# Patient Record
Sex: Male | Born: 1935 | Race: Black or African American | Hispanic: No | Smoking: Never smoker
Health system: Southern US, Community
[De-identification: ages and names within clinical notes are randomized; demographics above are authoritative.]

## PROBLEM LIST (undated history)

## (undated) DIAGNOSIS — E119 Type 2 diabetes mellitus without complications: Secondary | ICD-10-CM

## (undated) DIAGNOSIS — R569 Unspecified convulsions: Secondary | ICD-10-CM

## (undated) DIAGNOSIS — I10 Essential (primary) hypertension: Secondary | ICD-10-CM

## (undated) DIAGNOSIS — N200 Calculus of kidney: Secondary | ICD-10-CM

## (undated) DIAGNOSIS — E079 Disorder of thyroid, unspecified: Secondary | ICD-10-CM

## (undated) DIAGNOSIS — M109 Gout, unspecified: Secondary | ICD-10-CM

## (undated) HISTORY — PX: HERNIA REPAIR: SHX51

## (undated) HISTORY — DX: Calculus of kidney: N20.0

## (undated) HISTORY — PX: EYE SURGERY: SHX253

---

## 2006-05-19 ENCOUNTER — Other Ambulatory Visit: Payer: Self-pay

## 2006-05-19 ENCOUNTER — Emergency Department: Payer: Self-pay

## 2006-05-20 ENCOUNTER — Ambulatory Visit: Payer: Self-pay

## 2006-05-21 ENCOUNTER — Ambulatory Visit: Payer: Self-pay

## 2006-09-16 ENCOUNTER — Ambulatory Visit: Payer: Self-pay | Admitting: Infectious Diseases

## 2006-09-20 ENCOUNTER — Other Ambulatory Visit: Payer: Self-pay

## 2006-09-20 ENCOUNTER — Inpatient Hospital Stay: Payer: Self-pay | Admitting: Infectious Diseases

## 2007-09-20 ENCOUNTER — Inpatient Hospital Stay: Payer: Self-pay | Admitting: Internal Medicine

## 2008-08-07 ENCOUNTER — Emergency Department: Payer: Self-pay | Admitting: Emergency Medicine

## 2008-10-19 ENCOUNTER — Inpatient Hospital Stay: Payer: Self-pay | Admitting: Internal Medicine

## 2009-01-01 ENCOUNTER — Emergency Department: Payer: Self-pay | Admitting: Emergency Medicine

## 2010-07-23 ENCOUNTER — Ambulatory Visit: Payer: Self-pay | Admitting: Internal Medicine

## 2011-07-02 ENCOUNTER — Other Ambulatory Visit: Payer: Self-pay | Admitting: Internal Medicine

## 2011-07-03 ENCOUNTER — Other Ambulatory Visit: Payer: Self-pay | Admitting: Internal Medicine

## 2011-07-03 MED ORDER — MAGNESIUM OXIDE 400 MG PO TABS: 400.0000 mg | ORAL_TABLET | Freq: Two times a day (BID) | ORAL | Status: DC

## 2011-08-19 ENCOUNTER — Other Ambulatory Visit: Payer: Self-pay | Admitting: Internal Medicine

## 2011-08-20 MED ORDER — AMLODIPINE BESYLATE 10 MG PO TABS: 10.0000 mg | ORAL_TABLET | Freq: Every day | ORAL | Status: AC

## 2011-09-03 ENCOUNTER — Other Ambulatory Visit: Payer: Self-pay | Admitting: Internal Medicine

## 2011-10-04 ENCOUNTER — Ambulatory Visit: Payer: Self-pay | Admitting: Ophthalmology

## 2011-10-14 ENCOUNTER — Ambulatory Visit: Payer: Self-pay | Admitting: Ophthalmology

## 2011-11-28 ENCOUNTER — Other Ambulatory Visit: Payer: Self-pay | Admitting: *Deleted

## 2011-11-28 MED ORDER — MAGNESIUM OXIDE 400 MG PO TABS: 400.0000 mg | ORAL_TABLET | Freq: Two times a day (BID) | ORAL | Status: DC

## 2011-11-28 NOTE — Telephone Encounter (Signed)
Faxed request from [facility] [employer].  Pt hasn't been seen in this office and doesn't have any pending appts.

## 2011-11-28 NOTE — Telephone Encounter (Signed)
Ok to call in a 3o day supply #60 but no more  refills without visit.  We have been open over 6 months now

## 2011-12-26 ENCOUNTER — Other Ambulatory Visit: Payer: Self-pay | Admitting: Internal Medicine

## 2011-12-26 MED ORDER — METOPROLOL SUCCINATE ER 50 MG PO TB24: 50.0000 mg | ORAL_TABLET | Freq: Every day | ORAL | Status: DC

## 2012-01-01 ENCOUNTER — Other Ambulatory Visit: Payer: Self-pay | Admitting: Internal Medicine

## 2012-01-01 ENCOUNTER — Ambulatory Visit: Payer: Self-pay | Admitting: Internal Medicine

## 2012-01-01 NOTE — Telephone Encounter (Signed)
[phone] Pt went to wrong office this morning.  He is out of his levetiracet 500mg  2 x daily [facility] He stated he has been out of med for a while

## 2012-01-02 MED ORDER — LEVETIRACETAM 500 MG PO TABS: 500.0000 mg | ORAL_TABLET | Freq: Two times a day (BID) | ORAL | Status: DC

## 2012-01-02 NOTE — Telephone Encounter (Signed)
Patient missed his appt yesterday, but has rescheduled.  He has been out of this medication and needs a refill prior to the appt he made.  Please advise.

## 2012-01-08 ENCOUNTER — Ambulatory Visit: Payer: Medicare Other | Admitting: Internal Medicine

## 2012-01-08 DIAGNOSIS — Z0289 Encounter for other administrative examinations: Secondary | ICD-10-CM

## 2012-01-29 ENCOUNTER — Other Ambulatory Visit: Payer: Self-pay | Admitting: Internal Medicine

## 2012-01-29 MED ORDER — MAGNESIUM OXIDE 400 MG PO TABS: 400.0000 mg | ORAL_TABLET | Freq: Two times a day (BID) | ORAL | Status: AC

## 2012-01-29 MED ORDER — ALLOPURINOL 100 MG PO TABS: 100.0000 mg | ORAL_TABLET | Freq: Every day | ORAL | Status: DC

## 2012-03-10 ENCOUNTER — Other Ambulatory Visit: Payer: Self-pay | Admitting: Internal Medicine

## 2012-03-11 NOTE — Telephone Encounter (Signed)
Patient has cancelled and no showed for a few appointments, and does not have another one scheduled.  And is now requesting refills.  Please advise.

## 2012-03-11 NOTE — Telephone Encounter (Signed)
Do not refill!!   i just read your message

## 2012-03-12 ENCOUNTER — Other Ambulatory Visit: Payer: Self-pay | Admitting: Internal Medicine

## 2012-03-16 ENCOUNTER — Other Ambulatory Visit: Payer: Self-pay | Admitting: Internal Medicine

## 2012-09-24 ENCOUNTER — Other Ambulatory Visit: Payer: Self-pay | Admitting: Internal Medicine

## 2012-09-24 ENCOUNTER — Other Ambulatory Visit: Payer: Self-pay

## 2012-09-27 ENCOUNTER — Other Ambulatory Visit: Payer: Self-pay | Admitting: Internal Medicine

## 2013-01-10 ENCOUNTER — Other Ambulatory Visit: Payer: Self-pay | Admitting: Internal Medicine

## 2013-01-22 ENCOUNTER — Other Ambulatory Visit: Payer: Self-pay | Admitting: Internal Medicine

## 2013-01-28 ENCOUNTER — Ambulatory Visit: Payer: Self-pay | Admitting: Specialist

## 2013-02-01 ENCOUNTER — Other Ambulatory Visit: Payer: Self-pay | Admitting: Internal Medicine

## 2013-02-03 ENCOUNTER — Other Ambulatory Visit: Payer: Self-pay | Admitting: Internal Medicine

## 2013-02-03 NOTE — Telephone Encounter (Signed)
This is the third refill request we have received (the first two were filled). However, this patient has not been seen in over a year that I can see. Pt cancelled (3/13) & no showed (4/13). No recent or future appt has been scheduled. Please advise

## 2013-02-04 NOTE — Telephone Encounter (Signed)
No refills on anything in chart until he actually shows up for appt.

## 2013-04-27 ENCOUNTER — Ambulatory Visit: Payer: Self-pay | Admitting: Nephrology

## 2013-05-07 ENCOUNTER — Ambulatory Visit
Admit: 2013-05-07 | Disposition: A | Discharge status: Home / Self Care | Payer: Self-pay | Attending: Nurse Practitioner | Admitting: Nurse Practitioner

## 2013-05-09 ENCOUNTER — Inpatient Hospital Stay: Payer: Self-pay | Admitting: Internal Medicine

## 2013-05-09 LAB — URINALYSIS, COMPLETE
Blood: NEGATIVE
Glucose,UR: NEGATIVE mg/dL (ref 0–75)
Leukocyte Esterase: NEGATIVE
Nitrite: NEGATIVE
Ph: 5 (ref 4.5–8.0)
RBC,UR: 1 /HPF (ref 0–5)
Specific Gravity: 1.013 (ref 1.003–1.030)
Squamous Epithelial: 1
WBC UR: 1 /HPF (ref 0–5)

## 2013-05-09 LAB — CBC
HCT: 44.3 % (ref 40.0–52.0)
MCHC: 34.7 g/dL (ref 32.0–36.0)
MCV: 99 fL (ref 80–100)
Platelet: 238 10*3/uL (ref 150–440)
WBC: 5 10*3/uL (ref 3.8–10.6)

## 2013-05-09 LAB — BASIC METABOLIC PANEL
BUN: 29 mg/dL — ABNORMAL HIGH (ref 7–18)
Co2: 19 mmol/L — ABNORMAL LOW (ref 21–32)
Creatinine: 1.46 mg/dL — ABNORMAL HIGH (ref 0.60–1.30)
EGFR (African American): 53 — ABNORMAL LOW
Glucose: 102 mg/dL — ABNORMAL HIGH (ref 65–99)
Potassium: 5.1 mmol/L (ref 3.5–5.1)
Sodium: 134 mmol/L — ABNORMAL LOW (ref 136–145)

## 2013-05-09 LAB — COMPREHENSIVE METABOLIC PANEL
Albumin: 4 g/dL (ref 3.4–5.0)
Alkaline Phosphatase: 80 U/L (ref 50–136)
BUN: 35 mg/dL — ABNORMAL HIGH (ref 7–18)
Bilirubin,Total: 0.4 mg/dL (ref 0.2–1.0)
Calcium, Total: 9.2 mg/dL (ref 8.5–10.1)
Co2: 16 mmol/L — ABNORMAL LOW (ref 21–32)
EGFR (African American): 49 — ABNORMAL LOW
EGFR (Non-African Amer.): 43 — ABNORMAL LOW
Osmolality: 272 (ref 275–301)
Potassium: 5.7 mmol/L — ABNORMAL HIGH (ref 3.5–5.1)
SGOT(AST): 26 U/L (ref 15–37)
SGPT (ALT): 31 U/L (ref 12–78)
Total Protein: 9 g/dL — ABNORMAL HIGH (ref 6.4–8.2)

## 2013-05-09 LAB — LIPASE, BLOOD: Lipase: 133 U/L (ref 73–393)

## 2013-05-09 LAB — ETHANOL: Ethanol %: 0.068 % (ref 0.000–0.080)

## 2013-05-10 LAB — LIPID PANEL
Ldl Cholesterol, Calc: 37 mg/dL (ref 0–100)
VLDL Cholesterol, Calc: 24 mg/dL (ref 5–40)

## 2013-05-10 LAB — BASIC METABOLIC PANEL
Anion Gap: 7 (ref 7–16)
Calcium, Total: 9.3 mg/dL (ref 8.5–10.1)
Creatinine: 1.44 mg/dL — ABNORMAL HIGH (ref 0.60–1.30)
EGFR (Non-African Amer.): 46 — ABNORMAL LOW
Osmolality: 282 (ref 275–301)
Potassium: 5.3 mmol/L — ABNORMAL HIGH (ref 3.5–5.1)

## 2013-05-10 LAB — CBC WITH DIFFERENTIAL/PLATELET
Eosinophil #: 0 10*3/uL (ref 0.0–0.7)
Eosinophil %: 0.8 %
HCT: 40.3 % (ref 40.0–52.0)
Lymphocyte %: 18.1 %
Monocyte #: 0.6 x10 3/mm (ref 0.2–1.0)
Neutrophil #: 3.5 10*3/uL (ref 1.4–6.5)
Neutrophil %: 68.3 %
Platelet: 219 10*3/uL (ref 150–440)
RDW: 14.8 % — ABNORMAL HIGH (ref 11.5–14.5)

## 2013-05-10 LAB — HEMOGLOBIN A1C: Hemoglobin A1C: 8 % — ABNORMAL HIGH (ref 4.2–6.3)

## 2013-05-10 LAB — CLOSTRIDIUM DIFFICILE BY PCR

## 2013-05-11 LAB — BASIC METABOLIC PANEL
Anion Gap: 8 (ref 7–16)
Calcium, Total: 8.6 mg/dL (ref 8.5–10.1)
Creatinine: 1.19 mg/dL (ref 0.60–1.30)
EGFR (African American): >60
EGFR (Non-African Amer.): 58 — ABNORMAL LOW
Osmolality: 283 (ref 275–301)
Potassium: 3.3 mmol/L — ABNORMAL LOW (ref 3.5–5.1)
Sodium: 140 mmol/L (ref 136–145)

## 2013-05-11 LAB — URINE CULTURE

## 2013-05-13 LAB — CBC WITH DIFFERENTIAL/PLATELET
Basophil #: 0 10*3/uL (ref 0.0–0.1)
Eosinophil #: 0.2 10*3/uL (ref 0.0–0.7)
HCT: 38.3 % — ABNORMAL LOW (ref 40.0–52.0)
HGB: 13.2 g/dL (ref 13.0–18.0)
Lymphocyte #: 1.1 10*3/uL (ref 1.0–3.6)
Lymphocyte %: 36.3 %
MCH: 34.1 pg — ABNORMAL HIGH (ref 26.0–34.0)
MCHC: 34.4 g/dL (ref 32.0–36.0)
MCV: 99 fL (ref 80–100)
Monocyte %: 11.4 %
Neutrophil #: 1.3 10*3/uL — ABNORMAL LOW (ref 1.4–6.5)
Platelet: 183 10*3/uL (ref 150–440)
RBC: 3.87 10*6/uL — ABNORMAL LOW (ref 4.40–5.90)

## 2013-05-13 LAB — BASIC METABOLIC PANEL
Anion Gap: 7 (ref 7–16)
Calcium, Total: 8.9 mg/dL (ref 8.5–10.1)
Chloride: 110 mmol/L — ABNORMAL HIGH (ref 98–107)
Co2: 23 mmol/L (ref 21–32)
Creatinine: 1.18 mg/dL (ref 0.60–1.30)
Potassium: 4 mmol/L (ref 3.5–5.1)

## 2013-05-17 ENCOUNTER — Encounter: Payer: Self-pay | Admitting: Nephrology

## 2013-05-19 LAB — PATHOLOGY REPORT

## 2013-05-22 LAB — COMPREHENSIVE METABOLIC PANEL
Anion Gap: 7 (ref 7–16)
Bilirubin,Total: 0.4 mg/dL (ref 0.2–1.0)
Calcium, Total: 9.5 mg/dL (ref 8.5–10.1)
Chloride: 104 mmol/L (ref 98–107)
Co2: 25 mmol/L (ref 21–32)
EGFR (Non-African Amer.): 24 — ABNORMAL LOW
Osmolality: 277 (ref 275–301)
Potassium: 4.2 mmol/L (ref 3.5–5.1)
SGOT(AST): 23 U/L (ref 15–37)
SGPT (ALT): 18 U/L (ref 12–78)
Total Protein: 7.9 g/dL (ref 6.4–8.2)

## 2013-05-22 LAB — CBC
HCT: 38.8 % — ABNORMAL LOW (ref 40.0–52.0)
HGB: 13.3 g/dL (ref 13.0–18.0)
MCH: 33.5 pg (ref 26.0–34.0)
MCV: 98 fL (ref 80–100)
Platelet: 335 10*3/uL (ref 150–440)
RBC: 3.98 10*6/uL — ABNORMAL LOW (ref 4.40–5.90)
WBC: 4.9 10*3/uL (ref 3.8–10.6)

## 2013-05-22 LAB — ETHANOL
Ethanol %: <0.003 % (ref 0.000–0.080)
Ethanol: <3 mg/dL

## 2013-05-23 LAB — BASIC METABOLIC PANEL
Anion Gap: 4 — ABNORMAL LOW (ref 7–16)
BUN: 11 mg/dL (ref 7–18)
Calcium, Total: 8.4 mg/dL — ABNORMAL LOW (ref 8.5–10.1)
Chloride: 110 mmol/L — ABNORMAL HIGH (ref 98–107)
Co2: 24 mmol/L (ref 21–32)
Creatinine: 1.42 mg/dL — ABNORMAL HIGH (ref 0.60–1.30)
EGFR (African American): 54 — ABNORMAL LOW
Glucose: 109 mg/dL — ABNORMAL HIGH (ref 65–99)
Osmolality: 276 (ref 275–301)
Potassium: 5.1 mmol/L (ref 3.5–5.1)

## 2013-05-23 LAB — DRUG SCREEN, URINE
Amphetamines, Ur Screen: NEGATIVE (ref ?–1000)
Barbiturates, Ur Screen: NEGATIVE (ref ?–200)
Cannabinoid 50 Ng, Ur ~~LOC~~: NEGATIVE (ref ?–50)
Cocaine Metabolite,Ur ~~LOC~~: NEGATIVE (ref ?–300)
MDMA (Ecstasy)Ur Screen: NEGATIVE (ref ?–500)
Tricyclic, Ur Screen: NEGATIVE (ref ?–1000)

## 2013-05-23 LAB — URINALYSIS, COMPLETE
Bilirubin,UR: NEGATIVE
Blood: NEGATIVE
Glucose,UR: NEGATIVE mg/dL (ref 0–75)
Hyaline Cast: 3
Ketone: NEGATIVE
Leukocyte Esterase: NEGATIVE
Nitrite: NEGATIVE
Specific Gravity: 1.008 (ref 1.003–1.030)

## 2013-05-25 ENCOUNTER — Inpatient Hospital Stay: Payer: Self-pay | Admitting: Internal Medicine

## 2013-05-25 LAB — COMPREHENSIVE METABOLIC PANEL
Albumin: 3.7 g/dL (ref 3.4–5.0)
Alkaline Phosphatase: 76 U/L (ref 50–136)
Anion Gap: 10 (ref 7–16)
BUN: 7 mg/dL (ref 7–18)
Calcium, Total: 9.4 mg/dL (ref 8.5–10.1)
Chloride: 109 mmol/L — ABNORMAL HIGH (ref 98–107)
Creatinine: 1.08 mg/dL (ref 0.60–1.30)
EGFR (Non-African Amer.): >60
Glucose: 154 mg/dL — ABNORMAL HIGH (ref 65–99)
Potassium: 3.3 mmol/L — ABNORMAL LOW (ref 3.5–5.1)
SGOT(AST): 21 U/L (ref 15–37)
SGPT (ALT): 16 U/L (ref 12–78)
Total Protein: 7.6 g/dL (ref 6.4–8.2)

## 2013-05-25 LAB — CBC
HCT: 38.7 % — ABNORMAL LOW (ref 40.0–52.0)
HGB: 13.3 g/dL (ref 13.0–18.0)
MCH: 33.2 pg (ref 26.0–34.0)
Platelet: 290 10*3/uL (ref 150–440)
RBC: 3.99 10*6/uL — ABNORMAL LOW (ref 4.40–5.90)
WBC: 7.7 10*3/uL (ref 3.8–10.6)

## 2013-05-25 LAB — TSH: Thyroid Stimulating Horm: 3.56 u[IU]/mL

## 2013-05-26 LAB — CBC WITH DIFFERENTIAL/PLATELET
Basophil #: 0 10*3/uL (ref 0.0–0.1)
Basophil %: 0.3 %
Eosinophil #: 0 10*3/uL (ref 0.0–0.7)
HGB: 13.5 g/dL (ref 13.0–18.0)
Lymphocyte #: 1.2 10*3/uL (ref 1.0–3.6)
Lymphocyte %: 9.5 %
Monocyte #: 0.8 x10 3/mm (ref 0.2–1.0)
Neutrophil %: 83.6 %
Platelet: 271 10*3/uL (ref 150–440)
RDW: 14.4 % (ref 11.5–14.5)
WBC: 12.5 10*3/uL — ABNORMAL HIGH (ref 3.8–10.6)

## 2013-05-27 LAB — BASIC METABOLIC PANEL
Anion Gap: 9 (ref 7–16)
BUN: 21 mg/dL — ABNORMAL HIGH (ref 7–18)
Calcium, Total: 9.2 mg/dL (ref 8.5–10.1)
Chloride: 105 mmol/L (ref 98–107)
EGFR (African American): 36 — ABNORMAL LOW
Osmolality: 283 (ref 275–301)
Sodium: 140 mmol/L (ref 136–145)

## 2013-05-27 LAB — CBC WITH DIFFERENTIAL/PLATELET
Basophil #: 0.1 10*3/uL (ref 0.0–0.1)
Basophil %: 1.2 %
Eosinophil #: 0.3 10*3/uL (ref 0.0–0.7)
Eosinophil %: 5.1 %
HGB: 12.3 g/dL — ABNORMAL LOW (ref 13.0–18.0)
Lymphocyte #: 0.9 10*3/uL — ABNORMAL LOW (ref 1.0–3.6)
MCH: 33.6 pg (ref 26.0–34.0)
MCHC: 34 g/dL (ref 32.0–36.0)
Neutrophil #: 3.5 10*3/uL (ref 1.4–6.5)
Neutrophil %: 67.6 %
RBC: 3.66 10*6/uL — ABNORMAL LOW (ref 4.40–5.90)
RDW: 14.4 % (ref 11.5–14.5)
WBC: 5.2 10*3/uL (ref 3.8–10.6)

## 2013-05-27 LAB — VANCOMYCIN, TROUGH: Vancomycin, Trough: 22 ug/mL (ref 10–20)

## 2013-05-28 LAB — BASIC METABOLIC PANEL
Anion Gap: 11 (ref 7–16)
Calcium, Total: 9.2 mg/dL (ref 8.5–10.1)
Chloride: 102 mmol/L (ref 98–107)
Co2: 25 mmol/L (ref 21–32)
Creatinine: 1.89 mg/dL — ABNORMAL HIGH (ref 0.60–1.30)
EGFR (Non-African Amer.): 33 — ABNORMAL LOW
Glucose: 107 mg/dL — ABNORMAL HIGH (ref 65–99)
Osmolality: 278 (ref 275–301)
Potassium: 4.2 mmol/L (ref 3.5–5.1)
Sodium: 138 mmol/L (ref 136–145)

## 2013-05-29 LAB — BASIC METABOLIC PANEL
Anion Gap: 7 (ref 7–16)
BUN: 10 mg/dL (ref 7–18)
Calcium, Total: 9.4 mg/dL (ref 8.5–10.1)
Chloride: 106 mmol/L (ref 98–107)
Co2: 26 mmol/L (ref 21–32)
Creatinine: 1.33 mg/dL — ABNORMAL HIGH (ref 0.60–1.30)
EGFR (Non-African Amer.): 51 — ABNORMAL LOW
Glucose: 131 mg/dL — ABNORMAL HIGH (ref 65–99)
Osmolality: 278 (ref 275–301)
Sodium: 139 mmol/L (ref 136–145)

## 2013-05-30 LAB — BASIC METABOLIC PANEL
BUN: 8 mg/dL (ref 7–18)
Calcium, Total: 8.9 mg/dL (ref 8.5–10.1)
Co2: 23 mmol/L (ref 21–32)
Creatinine: 1.47 mg/dL — ABNORMAL HIGH (ref 0.60–1.30)
EGFR (African American): 52 — ABNORMAL LOW
EGFR (Non-African Amer.): 45 — ABNORMAL LOW
Osmolality: 283 (ref 275–301)
Potassium: 4.1 mmol/L (ref 3.5–5.1)
Sodium: 142 mmol/L (ref 136–145)

## 2013-05-30 LAB — CULTURE, BLOOD (SINGLE)

## 2013-05-31 LAB — BASIC METABOLIC PANEL
BUN: 11 mg/dL (ref 7–18)
Co2: 23 mmol/L (ref 21–32)
Creatinine: 1.51 mg/dL — ABNORMAL HIGH (ref 0.60–1.30)
EGFR (African American): 51 — ABNORMAL LOW
EGFR (Non-African Amer.): 44 — ABNORMAL LOW
Glucose: 144 mg/dL — ABNORMAL HIGH (ref 65–99)

## 2013-06-02 LAB — CBC WITH DIFFERENTIAL/PLATELET
Basophil %: 0.6 %
Lymphocyte #: 0.9 10*3/uL — ABNORMAL LOW (ref 1.0–3.6)
MCHC: 34.5 g/dL (ref 32.0–36.0)
MCV: 96 fL (ref 80–100)
Monocyte #: 0.6 x10 3/mm (ref 0.2–1.0)
Monocyte %: 8.6 %
Neutrophil #: 5 10*3/uL (ref 1.4–6.5)
Neutrophil %: 74.5 %
RBC: 3.47 10*6/uL — ABNORMAL LOW (ref 4.40–5.90)
RDW: 14.1 % (ref 11.5–14.5)
WBC: 6.7 10*3/uL (ref 3.8–10.6)

## 2013-06-02 LAB — BASIC METABOLIC PANEL
Calcium, Total: 9.6 mg/dL (ref 8.5–10.1)
Chloride: 105 mmol/L (ref 98–107)
Co2: 23 mmol/L (ref 21–32)
Creatinine: 1.41 mg/dL — ABNORMAL HIGH (ref 0.60–1.30)
EGFR (African American): 55 — ABNORMAL LOW
Sodium: 137 mmol/L (ref 136–145)

## 2013-06-02 LAB — FOLATE: Folic Acid: 30.7 ng/mL (ref 3.1–100.0)

## 2013-06-04 LAB — CREATININE, SERUM
Creatinine: 1.45 mg/dL — ABNORMAL HIGH (ref 0.60–1.30)
EGFR (African American): 53 — ABNORMAL LOW
EGFR (Non-African Amer.): 46 — ABNORMAL LOW

## 2013-06-05 ENCOUNTER — Emergency Department: Payer: Self-pay | Admitting: Emergency Medicine

## 2013-06-05 LAB — CBC
HCT: 33.8 % — ABNORMAL LOW (ref 40.0–52.0)
HGB: 11.3 g/dL — ABNORMAL LOW (ref 13.0–18.0)
MCH: 33.1 pg (ref 26.0–34.0)
Platelet: 280 10*3/uL (ref 150–440)
RBC: 3.42 10*6/uL — ABNORMAL LOW (ref 4.40–5.90)
RDW: 14 % (ref 11.5–14.5)

## 2013-06-05 LAB — COMPREHENSIVE METABOLIC PANEL
Bilirubin,Total: 0.3 mg/dL (ref 0.2–1.0)
Chloride: 107 mmol/L (ref 98–107)
Creatinine: 1.85 mg/dL — ABNORMAL HIGH (ref 0.60–1.30)
EGFR (Non-African Amer.): 34 — ABNORMAL LOW
Osmolality: 288 (ref 275–301)
SGOT(AST): 26 U/L (ref 15–37)
Total Protein: 7.5 g/dL (ref 6.4–8.2)

## 2013-06-05 LAB — TROPONIN I: Troponin-I: <0.02 ng/mL

## 2013-06-06 LAB — CBC WITH DIFFERENTIAL/PLATELET
Basophil #: 0.1 10*3/uL (ref 0.0–0.1)
Basophil %: 0.9 %
Eosinophil #: 0.2 10*3/uL (ref 0.0–0.7)
Eosinophil %: 1.5 %
HCT: 29.9 % — ABNORMAL LOW (ref 40.0–52.0)
HGB: 10.1 g/dL — ABNORMAL LOW (ref 13.0–18.0)
Lymphocyte #: 0.7 10*3/uL — ABNORMAL LOW (ref 1.0–3.6)
Lymphocyte %: 6.7 %
MCH: 32.8 pg (ref 26.0–34.0)
MCHC: 33.8 g/dL (ref 32.0–36.0)
MCV: 97 fL (ref 80–100)
Monocyte #: 0.8 x10 3/mm (ref 0.2–1.0)
Monocyte %: 6.8 %
Neutrophil #: 9.4 10*3/uL — ABNORMAL HIGH (ref 1.4–6.5)
Neutrophil %: 84.1 %
Platelet: 249 10*3/uL (ref 150–440)
RBC: 3.08 10*6/uL — ABNORMAL LOW (ref 4.40–5.90)
RDW: 13.9 % (ref 11.5–14.5)
WBC: 11.1 10*3/uL — ABNORMAL HIGH (ref 3.8–10.6)

## 2013-06-06 LAB — COMPREHENSIVE METABOLIC PANEL
Albumin: 3 g/dL — ABNORMAL LOW (ref 3.4–5.0)
BUN: 11 mg/dL (ref 7–18)
Bilirubin,Total: 0.5 mg/dL (ref 0.2–1.0)
Co2: 21 mmol/L (ref 21–32)
EGFR (Non-African Amer.): 58 — ABNORMAL LOW
Glucose: 132 mg/dL — ABNORMAL HIGH (ref 65–99)
Osmolality: 275 (ref 275–301)
SGPT (ALT): 18 U/L (ref 12–78)
Sodium: 137 mmol/L (ref 136–145)
Total Protein: 7.4 g/dL (ref 6.4–8.2)

## 2013-06-06 LAB — URINALYSIS, COMPLETE
Bacteria: NONE SEEN
Bilirubin,UR: NEGATIVE
Ketone: NEGATIVE
Leukocyte Esterase: NEGATIVE
Nitrite: NEGATIVE
Specific Gravity: 1.011 (ref 1.003–1.030)
Squamous Epithelial: <1
WBC UR: 4 /HPF (ref 0–5)

## 2013-06-06 LAB — DRUG SCREEN, URINE

## 2013-06-06 LAB — TROPONIN I: Troponin-I: 0.02 ng/mL

## 2013-06-06 LAB — ETHANOL: Ethanol: <3 mg/dL

## 2013-06-06 LAB — TSH: Thyroid Stimulating Horm: 4.19 u[IU]/mL

## 2013-06-06 LAB — ACETAMINOPHEN LEVEL: Acetaminophen: 2 ug/mL — ABNORMAL LOW

## 2013-06-10 ENCOUNTER — Emergency Department: Payer: Self-pay | Admitting: Emergency Medicine

## 2013-06-10 LAB — URINALYSIS, COMPLETE
Bacteria: NONE SEEN
Blood: NEGATIVE
Glucose,UR: NEGATIVE mg/dL (ref 0–75)
Hyaline Cast: 14
Ketone: NEGATIVE
Nitrite: NEGATIVE
Ph: 5 (ref 4.5–8.0)
Protein: NEGATIVE
Specific Gravity: 1.019 (ref 1.003–1.030)
Squamous Epithelial: 3
WBC UR: 3 /HPF (ref 0–5)

## 2013-06-10 LAB — ETHANOL: Ethanol: <3 mg/dL

## 2013-06-10 LAB — BASIC METABOLIC PANEL
Calcium, Total: 9.6 mg/dL (ref 8.5–10.1)
Chloride: 109 mmol/L — ABNORMAL HIGH (ref 98–107)
Osmolality: 285 (ref 275–301)

## 2013-06-10 LAB — CBC
HCT: 33.8 % — ABNORMAL LOW (ref 40.0–52.0)
MCH: 32.5 pg (ref 26.0–34.0)
MCV: 98 fL (ref 80–100)
Platelet: 303 10*3/uL (ref 150–440)
RDW: 13.8 % (ref 11.5–14.5)

## 2013-06-10 LAB — TROPONIN I: Troponin-I: <0.02 ng/mL

## 2013-06-17 ENCOUNTER — Emergency Department: Payer: Self-pay | Admitting: Emergency Medicine

## 2013-06-17 LAB — CBC WITH DIFFERENTIAL/PLATELET
Basophil #: 0.1 10*3/uL (ref 0.0–0.1)
Eosinophil #: 0.2 10*3/uL (ref 0.0–0.7)
HCT: 33.1 % — ABNORMAL LOW (ref 40.0–52.0)
HGB: 10.9 g/dL — ABNORMAL LOW (ref 13.0–18.0)
Lymphocyte #: 1 10*3/uL (ref 1.0–3.6)
Lymphocyte %: 15.5 %
MCH: 32.4 pg (ref 26.0–34.0)
Monocyte #: 0.3 x10 3/mm (ref 0.2–1.0)
Monocyte %: 5.1 %
Neutrophil %: 74.7 %
RDW: 13.7 % (ref 11.5–14.5)
WBC: 6.7 10*3/uL (ref 3.8–10.6)

## 2013-06-17 LAB — URINALYSIS, COMPLETE
Blood: NEGATIVE
Glucose,UR: NEGATIVE mg/dL (ref 0–75)
Ketone: NEGATIVE
Ph: 8 (ref 4.5–8.0)
Protein: NEGATIVE
RBC,UR: 1 /HPF (ref 0–5)
Specific Gravity: 1.015 (ref 1.003–1.030)
Squamous Epithelial: 5
WBC UR: 6 /HPF (ref 0–5)

## 2013-06-17 LAB — COMPREHENSIVE METABOLIC PANEL
Albumin: 3 g/dL — ABNORMAL LOW (ref 3.4–5.0)
Anion Gap: 6 — ABNORMAL LOW (ref 7–16)
BUN: 34 mg/dL — ABNORMAL HIGH (ref 7–18)
Calcium, Total: 8.4 mg/dL — ABNORMAL LOW (ref 8.5–10.1)
Chloride: 111 mmol/L — ABNORMAL HIGH (ref 98–107)
Creatinine: 1.54 mg/dL — ABNORMAL HIGH (ref 0.60–1.30)
EGFR (African American): 49 — ABNORMAL LOW
EGFR (Non-African Amer.): 43 — ABNORMAL LOW
Glucose: 78 mg/dL (ref 65–99)
Potassium: 5.1 mmol/L (ref 3.5–5.1)
SGOT(AST): 38 U/L — ABNORMAL HIGH (ref 15–37)
SGPT (ALT): 25 U/L (ref 12–78)
Sodium: 142 mmol/L (ref 136–145)
Total Protein: 7.1 g/dL (ref 6.4–8.2)

## 2013-06-17 LAB — CK TOTAL AND CKMB (NOT AT ARMC): CK, Total: 371 U/L — ABNORMAL HIGH (ref 35–232)

## 2013-06-17 LAB — DRUG SCREEN, URINE
Benzodiazepine, Ur Scrn: POSITIVE (ref ?–200)
Cannabinoid 50 Ng, Ur ~~LOC~~: NEGATIVE (ref ?–50)
Opiate, Ur Screen: NEGATIVE (ref ?–300)

## 2013-06-17 LAB — PROTIME-INR: INR: 1

## 2013-07-23 ENCOUNTER — Emergency Department: Payer: Self-pay | Admitting: Emergency Medicine

## 2013-07-23 LAB — URINALYSIS, COMPLETE
Glucose,UR: NEGATIVE mg/dL (ref 0–75)
Ketone: NEGATIVE
Nitrite: NEGATIVE
Ph: 5 (ref 4.5–8.0)
Protein: 30
Squamous Epithelial: <1

## 2013-07-25 LAB — CBC
MCH: 31.9 pg (ref 26.0–34.0)
MCHC: 32.9 g/dL (ref 32.0–36.0)
Platelet: 339 10*3/uL (ref 150–440)
RDW: 13.6 % (ref 11.5–14.5)
WBC: 6 10*3/uL (ref 3.8–10.6)

## 2013-07-25 LAB — COMPREHENSIVE METABOLIC PANEL
Alkaline Phosphatase: 162 U/L — ABNORMAL HIGH (ref 50–136)
BUN: 70 mg/dL — ABNORMAL HIGH (ref 7–18)
Bilirubin,Total: 0.2 mg/dL (ref 0.2–1.0)
Chloride: 105 mmol/L (ref 98–107)
Co2: 27 mmol/L (ref 21–32)
Creatinine: 3.28 mg/dL — ABNORMAL HIGH (ref 0.60–1.30)
EGFR (African American): 20 — ABNORMAL LOW
Glucose: 372 mg/dL — ABNORMAL HIGH (ref 65–99)
Osmolality: 306 (ref 275–301)
Potassium: 6.5 mmol/L (ref 3.5–5.1)
SGOT(AST): 25 U/L (ref 15–37)
SGPT (ALT): 28 U/L (ref 12–78)
Total Protein: 8.5 g/dL — ABNORMAL HIGH (ref 6.4–8.2)

## 2013-07-26 ENCOUNTER — Inpatient Hospital Stay: Payer: Self-pay | Admitting: Internal Medicine

## 2013-07-26 LAB — RENAL FUNCTION PANEL
Albumin: 2.9 g/dL — ABNORMAL LOW (ref 3.4–5.0)
Anion Gap: 6 — ABNORMAL LOW (ref 7–16)
Calcium, Total: 9.2 mg/dL (ref 8.5–10.1)
Chloride: 110 mmol/L — ABNORMAL HIGH (ref 98–107)
Co2: 27 mmol/L (ref 21–32)
Creatinine: 2.42 mg/dL — ABNORMAL HIGH (ref 0.60–1.30)
EGFR (Non-African Amer.): 25 — ABNORMAL LOW
Glucose: 213 mg/dL — ABNORMAL HIGH (ref 65–99)
Phosphorus: 2.9 mg/dL (ref 2.5–4.9)
Potassium: 5.3 mmol/L — ABNORMAL HIGH (ref 3.5–5.1)
Sodium: 143 mmol/L (ref 136–145)

## 2013-07-27 LAB — MAGNESIUM: Magnesium: 1.1 mg/dL — ABNORMAL LOW

## 2013-07-27 LAB — BASIC METABOLIC PANEL
Anion Gap: 9 (ref 7–16)
BUN: 47 mg/dL — ABNORMAL HIGH (ref 7–18)
Co2: 23 mmol/L (ref 21–32)
Creatinine: 2.06 mg/dL — ABNORMAL HIGH (ref 0.60–1.30)
EGFR (Non-African Amer.): 30 — ABNORMAL LOW
Glucose: 227 mg/dL — ABNORMAL HIGH (ref 65–99)
Osmolality: 304 (ref 275–301)
Potassium: 4.4 mmol/L (ref 3.5–5.1)
Sodium: 143 mmol/L (ref 136–145)

## 2013-07-27 LAB — CBC WITH DIFFERENTIAL/PLATELET
Basophil #: 0 10*3/uL (ref 0.0–0.1)
Basophil %: 0.6 %
Eosinophil #: 0.1 10*3/uL (ref 0.0–0.7)
Eosinophil %: 2 %
HCT: 26.4 % — ABNORMAL LOW (ref 40.0–52.0)
HGB: 9 g/dL — ABNORMAL LOW (ref 13.0–18.0)
Lymphocyte #: 0.8 10*3/uL — ABNORMAL LOW (ref 1.0–3.6)
MCH: 32.1 pg (ref 26.0–34.0)
MCHC: 34 g/dL (ref 32.0–36.0)
MCV: 95 fL (ref 80–100)
Monocyte #: 0.2 x10 3/mm (ref 0.2–1.0)
Monocyte %: 5.1 %
Neutrophil %: 72.9 %
WBC: 4.2 10*3/uL (ref 3.8–10.6)

## 2013-07-27 LAB — HEMOGLOBIN A1C: Hemoglobin A1C: 8.9 % — ABNORMAL HIGH (ref 4.2–6.3)

## 2013-07-27 LAB — PROTEIN ELECTROPHORESIS(ARMC)

## 2013-07-28 LAB — PROTEIN / CREATININE RATIO, URINE
Creatinine, Urine: 24.7 mg/dL — ABNORMAL LOW (ref 30.0–125.0)
Protein/Creat. Ratio: 486 mg/gCREAT — ABNORMAL HIGH (ref 0–200)

## 2013-08-20 LAB — CBC
HCT: 22.7 % — ABNORMAL LOW (ref 40.0–52.0)
HGB: 7.8 g/dL — ABNORMAL LOW (ref 13.0–18.0)
MCH: 31.9 pg (ref 26.0–34.0)
MCV: 93 fL (ref 80–100)
Platelet: 415 10*3/uL (ref 150–440)
RBC: 2.45 10*6/uL — ABNORMAL LOW (ref 4.40–5.90)
RDW: 13.7 % (ref 11.5–14.5)
WBC: 8.6 10*3/uL (ref 3.8–10.6)

## 2013-08-20 LAB — COMPREHENSIVE METABOLIC PANEL
Albumin: 2.5 g/dL — ABNORMAL LOW (ref 3.4–5.0)
Alkaline Phosphatase: 223 U/L — ABNORMAL HIGH (ref 50–136)
Calcium, Total: 9.1 mg/dL (ref 8.5–10.1)
Co2: 16 mmol/L — ABNORMAL LOW (ref 21–32)
EGFR (African American): 24 — ABNORMAL LOW
Osmolality: 288 (ref 275–301)
Potassium: 4.4 mmol/L (ref 3.5–5.1)
SGPT (ALT): 26 U/L (ref 12–78)
Sodium: 135 mmol/L — ABNORMAL LOW (ref 136–145)
Total Protein: 6.9 g/dL (ref 6.4–8.2)

## 2013-08-20 LAB — URINALYSIS, COMPLETE
Bilirubin,UR: NEGATIVE
Glucose,UR: NEGATIVE mg/dL (ref 0–75)
Ketone: NEGATIVE
Nitrite: NEGATIVE
Ph: 6 (ref 4.5–8.0)
RBC,UR: 61 /HPF (ref 0–5)
Specific Gravity: 1.014 (ref 1.003–1.030)
Squamous Epithelial: 16
WBC UR: 134 /HPF (ref 0–5)

## 2013-08-20 LAB — CK TOTAL AND CKMB (NOT AT ARMC): CK, Total: 125 U/L (ref 35–232)

## 2013-08-20 LAB — TROPONIN I: Troponin-I: <0.02 ng/mL

## 2013-08-21 ENCOUNTER — Inpatient Hospital Stay: Payer: Self-pay | Admitting: Internal Medicine

## 2013-08-21 ENCOUNTER — Ambulatory Visit: Payer: Self-pay | Admitting: Neurology

## 2013-08-21 LAB — COMPREHENSIVE METABOLIC PANEL
Albumin: 2 g/dL — ABNORMAL LOW (ref 3.4–5.0)
BUN: 38 mg/dL — ABNORMAL HIGH (ref 7–18)
Calcium, Total: 7.4 mg/dL — ABNORMAL LOW (ref 8.5–10.1)
Chloride: 109 mmol/L — ABNORMAL HIGH (ref 98–107)
EGFR (Non-African Amer.): 18 — ABNORMAL LOW
Osmolality: 296 (ref 275–301)
Potassium: 5 mmol/L (ref 3.5–5.1)
SGOT(AST): 37 U/L (ref 15–37)
SGPT (ALT): 31 U/L (ref 12–78)
Sodium: 136 mmol/L (ref 136–145)
Total Protein: 5.7 g/dL — ABNORMAL LOW (ref 6.4–8.2)

## 2013-08-21 LAB — IRON AND TIBC
Iron Bind.Cap.(Total): 147 ug/dL — ABNORMAL LOW (ref 250–450)
Iron Saturation: 5 %

## 2013-08-21 LAB — HEMOGLOBIN A1C: Hemoglobin A1C: 8.8 % — ABNORMAL HIGH (ref 4.2–6.3)

## 2013-08-22 LAB — CBC WITH DIFFERENTIAL/PLATELET
Basophil %: 0.1 %
Eosinophil %: 0.3 %
HCT: 17.1 % — ABNORMAL LOW (ref 40.0–52.0)
HGB: 5.6 g/dL — ABNORMAL LOW (ref 13.0–18.0)
MCH: 30 pg (ref 26.0–34.0)
MCHC: 32.7 g/dL (ref 32.0–36.0)
MCV: 92 fL (ref 80–100)
Monocyte #: 2 x10 3/mm — ABNORMAL HIGH (ref 0.2–1.0)
Monocyte %: 5.6 %
Neutrophil %: 92.4 %
Platelet: 166 10*3/uL (ref 150–440)
RBC: 1.87 10*6/uL — ABNORMAL LOW (ref 4.40–5.90)
WBC: 36.7 10*3/uL — ABNORMAL HIGH (ref 3.8–10.6)

## 2013-08-22 LAB — BASIC METABOLIC PANEL WITH GFR
Anion Gap: 9
BUN: 38 mg/dL — ABNORMAL HIGH
Calcium, Total: 7.1 mg/dL — ABNORMAL LOW
Chloride: 113 mmol/L — ABNORMAL HIGH
Co2: 17 mmol/L — ABNORMAL LOW
Creatinine: 2.55 mg/dL — ABNORMAL HIGH
EGFR (African American): 27 — ABNORMAL LOW
EGFR (Non-African Amer.): 23 — ABNORMAL LOW
Glucose: 209 mg/dL — ABNORMAL HIGH
Osmolality: 293
Potassium: 5.1 mmol/L
Sodium: 139 mmol/L

## 2013-08-22 LAB — MAGNESIUM
Magnesium: 0.5 mg/dL — ABNORMAL LOW
Magnesium: 2.5 mg/dL — ABNORMAL HIGH

## 2013-08-22 LAB — HEMOGLOBIN: HGB: 5.8 g/dL — ABNORMAL LOW

## 2013-08-22 LAB — HEMATOCRIT: HCT: 17.9 % — ABNORMAL LOW

## 2013-08-23 LAB — CBC WITH DIFFERENTIAL/PLATELET
Basophil %: 0.6 %
Eosinophil #: 0.3 10*3/uL (ref 0.0–0.7)
Eosinophil %: 1 %
HCT: 26 % — ABNORMAL LOW (ref 40.0–52.0)
HGB: 8.8 g/dL — ABNORMAL LOW (ref 13.0–18.0)
Lymphocyte #: 0.6 10*3/uL — ABNORMAL LOW (ref 1.0–3.6)
MCV: 89 fL (ref 80–100)
Monocyte %: 2.8 %
Neutrophil #: 29 10*3/uL — ABNORMAL HIGH (ref 1.4–6.5)
RBC: 2.92 10*6/uL — ABNORMAL LOW (ref 4.40–5.90)
RDW: 14.6 % — ABNORMAL HIGH (ref 11.5–14.5)

## 2013-08-23 LAB — CULTURE, BLOOD (SINGLE)

## 2013-08-23 LAB — BASIC METABOLIC PANEL
Anion Gap: 8 (ref 7–16)
BUN: 29 mg/dL — ABNORMAL HIGH (ref 7–18)
Chloride: 113 mmol/L — ABNORMAL HIGH (ref 98–107)
EGFR (African American): 37 — ABNORMAL LOW
EGFR (Non-African Amer.): 32 — ABNORMAL LOW
Glucose: 85 mg/dL (ref 65–99)
Potassium: 3.8 mmol/L (ref 3.5–5.1)

## 2013-08-23 LAB — URINE CULTURE

## 2013-08-24 LAB — CBC WITH DIFFERENTIAL/PLATELET
Basophil #: 0.1 10*3/uL (ref 0.0–0.1)
Basophil %: 0.4 %
Eosinophil #: 0.2 10*3/uL (ref 0.0–0.7)
Eosinophil %: 1.2 %
Lymphocyte #: 0.8 10*3/uL — ABNORMAL LOW (ref 1.0–3.6)
Lymphocyte %: 4.8 %
MCHC: 34.1 g/dL (ref 32.0–36.0)
Monocyte #: 0.6 x10 3/mm (ref 0.2–1.0)
Monocyte %: 3.3 %
Neutrophil #: 15.9 10*3/uL — ABNORMAL HIGH (ref 1.4–6.5)
Neutrophil %: 90.3 %
Platelet: 143 10*3/uL — ABNORMAL LOW (ref 150–440)
RBC: 3.12 10*6/uL — ABNORMAL LOW (ref 4.40–5.90)
RDW: 14.9 % — ABNORMAL HIGH (ref 11.5–14.5)
WBC: 17.6 10*3/uL — ABNORMAL HIGH (ref 3.8–10.6)

## 2013-08-24 LAB — CREATININE, SERUM: EGFR (African American): 53 — ABNORMAL LOW

## 2013-08-25 LAB — T4, FREE: Free Thyroxine: 1.23 ng/dL (ref 0.76–1.46)

## 2013-08-25 LAB — CBC WITH DIFFERENTIAL/PLATELET
Basophil #: 0 10*3/uL (ref 0.0–0.1)
Eosinophil %: 1.7 %
HCT: 26.4 % — ABNORMAL LOW (ref 40.0–52.0)
MCHC: 34.2 g/dL (ref 32.0–36.0)
MCV: 89 fL (ref 80–100)
Monocyte %: 5.1 %
Neutrophil #: 9.2 10*3/uL — ABNORMAL HIGH (ref 1.4–6.5)
Platelet: 140 10*3/uL — ABNORMAL LOW (ref 150–440)
WBC: 10.9 10*3/uL — ABNORMAL HIGH (ref 3.8–10.6)

## 2013-08-25 LAB — BASIC METABOLIC PANEL
Calcium, Total: 8 mg/dL — ABNORMAL LOW (ref 8.5–10.1)
Creatinine: 1.36 mg/dL — ABNORMAL HIGH (ref 0.60–1.30)
EGFR (African American): 57 — ABNORMAL LOW
EGFR (Non-African Amer.): 49 — ABNORMAL LOW
Glucose: 153 mg/dL — ABNORMAL HIGH (ref 65–99)
Osmolality: 285 (ref 275–301)
Potassium: 3.8 mmol/L (ref 3.5–5.1)
Sodium: 141 mmol/L (ref 136–145)

## 2013-09-06 ENCOUNTER — Emergency Department: Payer: Self-pay | Admitting: Emergency Medicine

## 2013-09-06 LAB — URINALYSIS, COMPLETE
Bacteria: NONE SEEN
Bilirubin,UR: NEGATIVE
Blood: NEGATIVE
Glucose,UR: NEGATIVE mg/dL (ref 0–75)
Hyaline Cast: 3
Nitrite: NEGATIVE
Ph: 6 (ref 4.5–8.0)
Protein: 30
RBC,UR: 2 /HPF (ref 0–5)
Squamous Epithelial: 3
WBC UR: 14 /HPF (ref 0–5)

## 2013-09-06 LAB — COMPREHENSIVE METABOLIC PANEL
Albumin: 2.7 g/dL — ABNORMAL LOW (ref 3.4–5.0)
Alkaline Phosphatase: 128 U/L — ABNORMAL HIGH
BUN: 26 mg/dL — ABNORMAL HIGH (ref 7–18)
Bilirubin,Total: 0.3 mg/dL (ref 0.2–1.0)
Calcium, Total: 9.4 mg/dL (ref 8.5–10.1)
Creatinine: 1.75 mg/dL — ABNORMAL HIGH (ref 0.60–1.30)
Glucose: 133 mg/dL — ABNORMAL HIGH (ref 65–99)
Osmolality: 275 (ref 275–301)
SGOT(AST): 19 U/L (ref 15–37)
Total Protein: 7.5 g/dL (ref 6.4–8.2)

## 2013-09-06 LAB — CBC
HCT: 29.1 % — ABNORMAL LOW (ref 40.0–52.0)
HGB: 9.6 g/dL — ABNORMAL LOW (ref 13.0–18.0)
MCH: 29.5 pg (ref 26.0–34.0)
RBC: 3.27 10*6/uL — ABNORMAL LOW (ref 4.40–5.90)
RDW: 14.3 % (ref 11.5–14.5)
WBC: 8.7 10*3/uL (ref 3.8–10.6)

## 2013-09-08 ENCOUNTER — Inpatient Hospital Stay: Payer: Self-pay | Admitting: Internal Medicine

## 2013-09-08 ENCOUNTER — Ambulatory Visit: Payer: Self-pay | Admitting: Internal Medicine

## 2013-09-08 LAB — URINALYSIS, COMPLETE
Bilirubin,UR: NEGATIVE
Glucose,UR: NEGATIVE mg/dL (ref 0–75)
Hyaline Cast: 2
Ketone: NEGATIVE
Nitrite: NEGATIVE
Ph: 5 (ref 4.5–8.0)
WBC UR: 79 /HPF (ref 0–5)

## 2013-09-08 LAB — CBC WITH DIFFERENTIAL/PLATELET
Basophil #: 0.1 10*3/uL (ref 0.0–0.1)
Eosinophil #: 0.1 10*3/uL (ref 0.0–0.7)
HCT: 29.3 % — ABNORMAL LOW (ref 40.0–52.0)
HGB: 9.8 g/dL — ABNORMAL LOW (ref 13.0–18.0)
MCH: 30 pg (ref 26.0–34.0)
MCHC: 33.5 g/dL (ref 32.0–36.0)
Monocyte #: 0.5 x10 3/mm (ref 0.2–1.0)
Monocyte %: 8.5 %
Neutrophil #: 4.4 10*3/uL (ref 1.4–6.5)
Neutrophil %: 77 %
RDW: 14 % (ref 11.5–14.5)
WBC: 5.8 10*3/uL (ref 3.8–10.6)

## 2013-09-08 LAB — TROPONIN I: Troponin-I: <0.02 ng/mL

## 2013-09-08 LAB — MAGNESIUM: Magnesium: 1.6 mg/dL — ABNORMAL LOW

## 2013-09-09 LAB — CBC WITH DIFFERENTIAL/PLATELET
Basophil %: 1.2 %
Eosinophil #: 0.1 10*3/uL (ref 0.0–0.7)
Eosinophil %: 2.7 %
HGB: 10.9 g/dL — ABNORMAL LOW (ref 13.0–18.0)
Lymphocyte #: 1 10*3/uL (ref 1.0–3.6)
MCH: 30.3 pg (ref 26.0–34.0)
MCHC: 33.8 g/dL (ref 32.0–36.0)
MCV: 90 fL (ref 80–100)
Monocyte %: 13.3 %
Neutrophil #: 3.3 10*3/uL (ref 1.4–6.5)
Neutrophil %: 62.7 %
Platelet: 431 10*3/uL (ref 150–440)
RBC: 3.61 10*6/uL — ABNORMAL LOW (ref 4.40–5.90)
RDW: 14.3 % (ref 11.5–14.5)

## 2013-09-09 LAB — BASIC METABOLIC PANEL
Anion Gap: 8 (ref 7–16)
BUN: 16 mg/dL (ref 7–18)
Calcium, Total: 9.1 mg/dL (ref 8.5–10.1)
Chloride: 107 mmol/L (ref 98–107)
Co2: 22 mmol/L (ref 21–32)
EGFR (African American): 54 — ABNORMAL LOW
Glucose: 120 mg/dL — ABNORMAL HIGH (ref 65–99)
Osmolality: 276 (ref 275–301)

## 2013-09-10 LAB — BASIC METABOLIC PANEL
BUN: 14 mg/dL (ref 7–18)
Calcium, Total: 8.8 mg/dL (ref 8.5–10.1)
Chloride: 106 mmol/L (ref 98–107)
Creatinine: 1.51 mg/dL — ABNORMAL HIGH (ref 0.60–1.30)
EGFR (Non-African Amer.): 44 — ABNORMAL LOW
Potassium: 4.7 mmol/L (ref 3.5–5.1)
Sodium: 136 mmol/L (ref 136–145)

## 2013-09-10 LAB — URINE CULTURE

## 2013-09-13 LAB — CULTURE, BLOOD (SINGLE)

## 2013-09-20 ENCOUNTER — Other Ambulatory Visit: Payer: Self-pay | Admitting: Family Medicine

## 2013-09-20 LAB — POTASSIUM: Potassium: 6 mmol/L — ABNORMAL HIGH (ref 3.5–5.1)

## 2013-10-07 ENCOUNTER — Ambulatory Visit: Payer: Self-pay | Admitting: Internal Medicine

## 2013-11-07 ENCOUNTER — Ambulatory Visit: Payer: Self-pay | Admitting: Nurse Practitioner

## 2013-11-22 ENCOUNTER — Inpatient Hospital Stay: Payer: Self-pay | Admitting: Internal Medicine

## 2013-11-22 LAB — BASIC METABOLIC PANEL
Anion Gap: 4 — ABNORMAL LOW (ref 7–16)
BUN: 40 mg/dL — ABNORMAL HIGH (ref 7–18)
CALCIUM: 9.9 mg/dL (ref 8.5–10.1)
CREATININE: 2.13 mg/dL — AB (ref 0.60–1.30)
Chloride: 102 mmol/L (ref 98–107)
Co2: 29 mmol/L (ref 21–32)
EGFR (Non-African Amer.): 29 — ABNORMAL LOW
GFR CALC AF AMER: 33 — AB
GLUCOSE: 182 mg/dL — AB (ref 65–99)
OSMOLALITY: 284 (ref 275–301)
POTASSIUM: 5.9 mmol/L — AB (ref 3.5–5.1)
Sodium: 135 mmol/L — ABNORMAL LOW (ref 136–145)

## 2013-11-22 LAB — URINALYSIS, COMPLETE
BLOOD: NEGATIVE
Bacteria: NONE SEEN
Bilirubin,UR: NEGATIVE
Glucose,UR: NEGATIVE mg/dL (ref 0–75)
Hyaline Cast: 3
KETONE: NEGATIVE
Leukocyte Esterase: NEGATIVE
NITRITE: NEGATIVE
PROTEIN: NEGATIVE
Ph: 7 (ref 4.5–8.0)
SPECIFIC GRAVITY: 1.011 (ref 1.003–1.030)
Squamous Epithelial: NONE SEEN

## 2013-11-22 LAB — CBC
HCT: 33.3 % — AB (ref 40.0–52.0)
HGB: 11.2 g/dL — ABNORMAL LOW (ref 13.0–18.0)
MCH: 31.1 pg (ref 26.0–34.0)
MCHC: 33.7 g/dL (ref 32.0–36.0)
MCV: 92 fL (ref 80–100)
Platelet: 192 10*3/uL (ref 150–440)
RBC: 3.61 10*6/uL — ABNORMAL LOW (ref 4.40–5.90)
RDW: 15.7 % — ABNORMAL HIGH (ref 11.5–14.5)
WBC: 5.4 10*3/uL (ref 3.8–10.6)

## 2013-11-22 LAB — MAGNESIUM: MAGNESIUM: 1.3 mg/dL — AB

## 2013-11-22 LAB — POTASSIUM: POTASSIUM: 4.8 mmol/L (ref 3.5–5.1)

## 2013-11-22 LAB — VALPROIC ACID LEVEL: VALPROIC ACID: 21 ug/mL — AB

## 2013-11-23 LAB — CBC WITH DIFFERENTIAL/PLATELET
BASOS ABS: 0 10*3/uL (ref 0.0–0.1)
BASOS PCT: 0.7 %
EOS ABS: 0.1 10*3/uL (ref 0.0–0.7)
EOS PCT: 3.1 %
HCT: 29.7 % — ABNORMAL LOW (ref 40.0–52.0)
HGB: 10.1 g/dL — ABNORMAL LOW (ref 13.0–18.0)
LYMPHS PCT: 22.4 %
Lymphocyte #: 0.9 10*3/uL — ABNORMAL LOW (ref 1.0–3.6)
MCH: 31.3 pg (ref 26.0–34.0)
MCHC: 34.1 g/dL (ref 32.0–36.0)
MCV: 92 fL (ref 80–100)
Monocyte #: 0.4 x10 3/mm (ref 0.2–1.0)
Monocyte %: 9.1 %
Neutrophil #: 2.7 10*3/uL (ref 1.4–6.5)
Neutrophil %: 64.7 %
PLATELETS: 192 10*3/uL (ref 150–440)
RBC: 3.23 10*6/uL — AB (ref 4.40–5.90)
RDW: 15.5 % — ABNORMAL HIGH (ref 11.5–14.5)
WBC: 4.2 10*3/uL (ref 3.8–10.6)

## 2013-11-23 LAB — BASIC METABOLIC PANEL
Anion Gap: 5 — ABNORMAL LOW (ref 7–16)
BUN: 31 mg/dL — ABNORMAL HIGH (ref 7–18)
CALCIUM: 8.8 mg/dL (ref 8.5–10.1)
CREATININE: 1.76 mg/dL — AB (ref 0.60–1.30)
Chloride: 107 mmol/L (ref 98–107)
Co2: 26 mmol/L (ref 21–32)
EGFR (Non-African Amer.): 36 — ABNORMAL LOW
GFR CALC AF AMER: 42 — AB
GLUCOSE: 150 mg/dL — AB (ref 65–99)
OSMOLALITY: 285 (ref 275–301)
POTASSIUM: 4.4 mmol/L (ref 3.5–5.1)
SODIUM: 138 mmol/L (ref 136–145)

## 2013-11-23 LAB — MAGNESIUM: Magnesium: 1.9 mg/dL

## 2013-11-23 LAB — HEMOGLOBIN A1C: Hemoglobin A1C: 7.2 % — ABNORMAL HIGH (ref 4.2–6.3)

## 2013-11-23 LAB — TSH: Thyroid Stimulating Horm: 4.84 u[IU]/mL — ABNORMAL HIGH

## 2013-11-24 LAB — BASIC METABOLIC PANEL
ANION GAP: 7 (ref 7–16)
BUN: 23 mg/dL — ABNORMAL HIGH (ref 7–18)
CHLORIDE: 106 mmol/L (ref 98–107)
CREATININE: 1.55 mg/dL — AB (ref 0.60–1.30)
Calcium, Total: 8.9 mg/dL (ref 8.5–10.1)
Co2: 25 mmol/L (ref 21–32)
EGFR (African American): 49 — ABNORMAL LOW
EGFR (Non-African Amer.): 42 — ABNORMAL LOW
Glucose: 79 mg/dL (ref 65–99)
OSMOLALITY: 278 (ref 275–301)
POTASSIUM: 4.2 mmol/L (ref 3.5–5.1)
Sodium: 138 mmol/L (ref 136–145)

## 2013-12-05 ENCOUNTER — Ambulatory Visit: Payer: Self-pay | Admitting: Nurse Practitioner

## 2014-01-05 ENCOUNTER — Ambulatory Visit: Payer: Self-pay | Admitting: Nurse Practitioner

## 2014-03-07 ENCOUNTER — Ambulatory Visit
Admit: 2014-03-07 | Disposition: A | Discharge status: Home / Self Care | Payer: Self-pay | Attending: Nurse Practitioner | Admitting: Nurse Practitioner

## 2014-04-03 ENCOUNTER — Inpatient Hospital Stay: Payer: Self-pay | Admitting: Internal Medicine

## 2014-04-03 LAB — CBC
HCT: 37.3 % — AB (ref 40.0–52.0)
HGB: 12.4 g/dL — AB (ref 13.0–18.0)
MCH: 31.2 pg (ref 26.0–34.0)
MCHC: 33.4 g/dL (ref 32.0–36.0)
MCV: 94 fL (ref 80–100)
Platelet: 198 10*3/uL (ref 150–440)
RBC: 3.98 10*6/uL — AB (ref 4.40–5.90)
RDW: 13.9 % (ref 11.5–14.5)
WBC: 4.9 10*3/uL (ref 3.8–10.6)

## 2014-04-03 LAB — POTASSIUM
POTASSIUM: 6 mmol/L — AB (ref 3.5–5.1)
POTASSIUM: 6.5 mmol/L — AB (ref 3.5–5.1)
Potassium: 3.2 mmol/L — ABNORMAL LOW (ref 3.5–5.1)

## 2014-04-03 LAB — URINALYSIS, COMPLETE
BLOOD: NEGATIVE
Bacteria: NONE SEEN
Bilirubin,UR: NEGATIVE
Glucose,UR: >=500 mg/dL (ref 0–75)
Ketone: NEGATIVE
Leukocyte Esterase: NEGATIVE
Nitrite: NEGATIVE
Ph: 7 (ref 4.5–8.0)
Protein: NEGATIVE
Specific Gravity: 1.009 (ref 1.003–1.030)
Squamous Epithelial: 1

## 2014-04-03 LAB — COMPREHENSIVE METABOLIC PANEL
ANION GAP: 7 (ref 7–16)
Albumin: 4.2 g/dL (ref 3.4–5.0)
Alkaline Phosphatase: 105 U/L
BUN: 56 mg/dL — AB (ref 7–18)
Bilirubin,Total: 0.3 mg/dL (ref 0.2–1.0)
CREATININE: 3.11 mg/dL — AB (ref 0.60–1.30)
Calcium, Total: 9.6 mg/dL (ref 8.5–10.1)
Chloride: 108 mmol/L — ABNORMAL HIGH (ref 98–107)
Co2: 21 mmol/L (ref 21–32)
EGFR (African American): 21 — ABNORMAL LOW
GFR CALC NON AF AMER: 18 — AB
GLUCOSE: 106 mg/dL — AB (ref 65–99)
Osmolality: 288 (ref 275–301)
Potassium: 6.1 mmol/L — ABNORMAL HIGH (ref 3.5–5.1)
SGOT(AST): 12 U/L — ABNORMAL LOW (ref 15–37)
SGPT (ALT): 25 U/L (ref 12–78)
SODIUM: 136 mmol/L (ref 136–145)
TOTAL PROTEIN: 8.4 g/dL — AB (ref 6.4–8.2)

## 2014-04-03 LAB — PHOSPHORUS: Phosphorus: 3.1 mg/dL (ref 2.5–4.9)

## 2014-04-03 LAB — TROPONIN I

## 2014-04-03 LAB — PROTEIN / CREATININE RATIO, URINE
CREATININE, URINE: 64 mg/dL (ref 30.0–125.0)
Protein, Random Urine: 9 mg/dL (ref 0–12)
Protein/Creat. Ratio: 141 mg/gCREAT (ref 0–200)

## 2014-04-04 LAB — CBC WITH DIFFERENTIAL/PLATELET
BASOS ABS: 0 10*3/uL (ref 0.0–0.1)
BASOS PCT: 0.4 %
Eosinophil #: 0.1 10*3/uL (ref 0.0–0.7)
Eosinophil %: 2.1 %
HCT: 33 % — ABNORMAL LOW (ref 40.0–52.0)
HGB: 10.6 g/dL — AB (ref 13.0–18.0)
Lymphocyte #: 0.6 10*3/uL — ABNORMAL LOW (ref 1.0–3.6)
Lymphocyte %: 13 %
MCH: 30.3 pg (ref 26.0–34.0)
MCHC: 32.1 g/dL (ref 32.0–36.0)
MCV: 95 fL (ref 80–100)
MONO ABS: 0.5 x10 3/mm (ref 0.2–1.0)
Monocyte %: 12.2 %
NEUTROS ABS: 3.1 10*3/uL (ref 1.4–6.5)
Neutrophil %: 72.3 %
Platelet: 172 10*3/uL (ref 150–440)
RBC: 3.49 10*6/uL — ABNORMAL LOW (ref 4.40–5.90)
RDW: 13.9 % (ref 11.5–14.5)
WBC: 4.4 10*3/uL (ref 3.8–10.6)

## 2014-04-04 LAB — CREATININE, SERUM
CREATININE: 2.6 mg/dL — AB (ref 0.60–1.30)
EGFR (African American): 26 — ABNORMAL LOW
EGFR (Non-African Amer.): 22 — ABNORMAL LOW

## 2014-04-04 LAB — POTASSIUM: Potassium: 5 mmol/L (ref 3.5–5.1)

## 2014-04-04 LAB — BASIC METABOLIC PANEL
Anion Gap: 7 (ref 7–16)
BUN: 41 mg/dL — AB (ref 7–18)
Calcium, Total: 8.9 mg/dL (ref 8.5–10.1)
Chloride: 113 mmol/L — ABNORMAL HIGH (ref 98–107)
Co2: 21 mmol/L (ref 21–32)
Creatinine: 2.82 mg/dL — ABNORMAL HIGH (ref 0.60–1.30)
EGFR (African American): 24 — ABNORMAL LOW
EGFR (Non-African Amer.): 20 — ABNORMAL LOW
GLUCOSE: 133 mg/dL — AB (ref 65–99)
OSMOLALITY: 293 (ref 275–301)
POTASSIUM: 5.6 mmol/L — AB (ref 3.5–5.1)
SODIUM: 141 mmol/L (ref 136–145)

## 2014-04-05 LAB — UR PROT ELECTROPHORESIS, URINE RANDOM

## 2014-04-05 LAB — PROTEIN ELECTROPHORESIS(ARMC)

## 2014-04-06 ENCOUNTER — Ambulatory Visit
Admit: 2014-04-06 | Disposition: A | Discharge status: Home / Self Care | Payer: Self-pay | Attending: Nurse Practitioner | Admitting: Nurse Practitioner

## 2014-04-14 ENCOUNTER — Inpatient Hospital Stay: Payer: Self-pay | Admitting: Internal Medicine

## 2014-04-14 LAB — COMPREHENSIVE METABOLIC PANEL
Albumin: 3.9 g/dL (ref 3.4–5.0)
Alkaline Phosphatase: 96 U/L
Anion Gap: 10 (ref 7–16)
BUN: 40 mg/dL — ABNORMAL HIGH (ref 7–18)
Bilirubin,Total: 0.3 mg/dL (ref 0.2–1.0)
Calcium, Total: 8.7 mg/dL (ref 8.5–10.1)
Chloride: 101 mmol/L (ref 98–107)
Co2: 18 mmol/L — ABNORMAL LOW (ref 21–32)
Creatinine: 2.83 mg/dL — ABNORMAL HIGH (ref 0.60–1.30)
EGFR (African American): 23 — ABNORMAL LOW
EGFR (Non-African Amer.): 20 — ABNORMAL LOW
Glucose: 206 mg/dL — ABNORMAL HIGH (ref 65–99)
Osmolality: 275 (ref 275–301)
Potassium: 6.9 mmol/L (ref 3.5–5.1)
SGOT(AST): 25 U/L (ref 15–37)
SGPT (ALT): 27 U/L (ref 12–78)
Sodium: 129 mmol/L — ABNORMAL LOW (ref 136–145)
Total Protein: 7.9 g/dL (ref 6.4–8.2)

## 2014-04-14 LAB — CBC WITH DIFFERENTIAL/PLATELET
BASOS PCT: 0.5 %
Basophil #: 0 10*3/uL (ref 0.0–0.1)
EOS PCT: 3.8 %
Eosinophil #: 0.2 10*3/uL (ref 0.0–0.7)
HCT: 34.9 % — AB (ref 40.0–52.0)
HGB: 11.3 g/dL — ABNORMAL LOW (ref 13.0–18.0)
LYMPHS PCT: 23.3 %
Lymphocyte #: 1.3 10*3/uL (ref 1.0–3.6)
MCH: 30.5 pg (ref 26.0–34.0)
MCHC: 32.4 g/dL (ref 32.0–36.0)
MCV: 94 fL (ref 80–100)
MONOS PCT: 9.2 %
Monocyte #: 0.5 x10 3/mm (ref 0.2–1.0)
NEUTROS PCT: 63.2 %
Neutrophil #: 3.5 10*3/uL (ref 1.4–6.5)
Platelet: 199 10*3/uL (ref 150–440)
RBC: 3.71 10*6/uL — ABNORMAL LOW (ref 4.40–5.90)
RDW: 14.1 % (ref 11.5–14.5)
WBC: 5.5 10*3/uL (ref 3.8–10.6)

## 2014-04-14 LAB — TROPONIN I: Troponin-I: <0.02 ng/mL

## 2014-04-14 LAB — POTASSIUM: Potassium: 6.6 mmol/L (ref 3.5–5.1)

## 2014-04-15 LAB — BASIC METABOLIC PANEL
Anion Gap: 8 (ref 7–16)
BUN: 42 mg/dL — ABNORMAL HIGH (ref 7–18)
CHLORIDE: 105 mmol/L (ref 98–107)
CREATININE: 3.04 mg/dL — AB (ref 0.60–1.30)
Calcium, Total: 9 mg/dL (ref 8.5–10.1)
Co2: 24 mmol/L (ref 21–32)
EGFR (African American): 22 — ABNORMAL LOW
EGFR (Non-African Amer.): 19 — ABNORMAL LOW
Glucose: 148 mg/dL — ABNORMAL HIGH (ref 65–99)
OSMOLALITY: 287 (ref 275–301)
Potassium: 6.3 mmol/L — ABNORMAL HIGH (ref 3.5–5.1)
Sodium: 137 mmol/L (ref 136–145)

## 2014-04-15 LAB — CBC WITH DIFFERENTIAL/PLATELET
Basophil #: 0 10*3/uL (ref 0.0–0.1)
Basophil %: 0.8 %
Eosinophil #: 0.2 10*3/uL (ref 0.0–0.7)
Eosinophil %: 3.4 %
HCT: 31.6 % — ABNORMAL LOW (ref 40.0–52.0)
HGB: 10.6 g/dL — AB (ref 13.0–18.0)
LYMPHS ABS: 1.3 10*3/uL (ref 1.0–3.6)
Lymphocyte %: 26.9 %
MCH: 32.2 pg (ref 26.0–34.0)
MCHC: 33.5 g/dL (ref 32.0–36.0)
MCV: 96 fL (ref 80–100)
MONO ABS: 0.6 x10 3/mm (ref 0.2–1.0)
MONOS PCT: 12.4 %
NEUTROS PCT: 56.5 %
Neutrophil #: 2.8 10*3/uL (ref 1.4–6.5)
PLATELETS: 204 10*3/uL (ref 150–440)
RBC: 3.28 10*6/uL — ABNORMAL LOW (ref 4.40–5.90)
RDW: 13.9 % (ref 11.5–14.5)
WBC: 5 10*3/uL (ref 3.8–10.6)

## 2014-04-15 LAB — RENAL FUNCTION PANEL
ALBUMIN: 3.7 g/dL (ref 3.4–5.0)
Anion Gap: 7 (ref 7–16)
BUN: 41 mg/dL — AB (ref 7–18)
Calcium, Total: 9.1 mg/dL (ref 8.5–10.1)
Chloride: 106 mmol/L (ref 98–107)
Co2: 25 mmol/L (ref 21–32)
Creatinine: 2.95 mg/dL — ABNORMAL HIGH (ref 0.60–1.30)
EGFR (African American): 22 — ABNORMAL LOW
EGFR (Non-African Amer.): 19 — ABNORMAL LOW
GLUCOSE: 168 mg/dL — AB (ref 65–99)
Osmolality: 290 (ref 275–301)
Phosphorus: 3.1 mg/dL (ref 2.5–4.9)
Potassium: 5.9 mmol/L — ABNORMAL HIGH (ref 3.5–5.1)
Sodium: 138 mmol/L (ref 136–145)

## 2014-04-16 LAB — CREATININE, URINE, 24 HOUR
CREATININE, URINE: 54.2 mg/dL (ref 30.0–125.0)
Collection Hours: 24 hours
Creatinine, 24 Hr Urine: 867 mg/24hr (ref 600–2500)
TOTAL VOLUME C4TV(ML): 1600 mL

## 2014-04-16 LAB — RENAL FUNCTION PANEL
Albumin: 3.3 g/dL — ABNORMAL LOW (ref 3.4–5.0)
Anion Gap: 9 (ref 7–16)
BUN: 38 mg/dL — ABNORMAL HIGH (ref 7–18)
CREATININE: 2.52 mg/dL — AB (ref 0.60–1.30)
Calcium, Total: 8.1 mg/dL — ABNORMAL LOW (ref 8.5–10.1)
Chloride: 108 mmol/L — ABNORMAL HIGH (ref 98–107)
Co2: 22 mmol/L (ref 21–32)
EGFR (Non-African Amer.): 23 — ABNORMAL LOW
GFR CALC AF AMER: 27 — AB
GLUCOSE: 73 mg/dL (ref 65–99)
Osmolality: 285 (ref 275–301)
PHOSPHORUS: 3.1 mg/dL (ref 2.5–4.9)
Potassium: 4.6 mmol/L (ref 3.5–5.1)
SODIUM: 139 mmol/L (ref 136–145)

## 2014-04-16 LAB — URINALYSIS, COMPLETE
BILIRUBIN, UR: NEGATIVE
Bacteria: NONE SEEN
Blood: NEGATIVE
Glucose,UR: NEGATIVE mg/dL (ref 0–75)
Ketone: NEGATIVE
LEUKOCYTE ESTERASE: NEGATIVE
Nitrite: NEGATIVE
Ph: 6 (ref 4.5–8.0)
Protein: NEGATIVE
RBC, UR: NONE SEEN /HPF (ref 0–5)
Specific Gravity: 1.011 (ref 1.003–1.030)
Squamous Epithelial: NONE SEEN

## 2014-04-16 LAB — PROTEIN, URINE, 24 HOUR
PROTEIN, URINE: 7 mg/dL (ref 0–12)
Protein, 24 Hour Urine: 112 mg/24HR (ref 30–149)

## 2014-04-17 LAB — BASIC METABOLIC PANEL
Anion Gap: 7 (ref 7–16)
BUN: 33 mg/dL — ABNORMAL HIGH (ref 7–18)
CHLORIDE: 111 mmol/L — AB (ref 98–107)
Calcium, Total: 8.4 mg/dL — ABNORMAL LOW (ref 8.5–10.1)
Co2: 23 mmol/L (ref 21–32)
Creatinine: 2.17 mg/dL — ABNORMAL HIGH (ref 0.60–1.30)
EGFR (Non-African Amer.): 28 — ABNORMAL LOW
GFR CALC AF AMER: 32 — AB
Glucose: 97 mg/dL (ref 65–99)
OSMOLALITY: 288 (ref 275–301)
Potassium: 5 mmol/L (ref 3.5–5.1)
Sodium: 141 mmol/L (ref 136–145)

## 2014-11-21 ENCOUNTER — Other Ambulatory Visit: Payer: Self-pay | Admitting: Internal Medicine

## 2015-01-27 NOTE — Discharge Summary (Signed)
PATIENT NAME:  Phillip Osborne, Phillip Osborne MR#:  [id number] DATE OF BIRTH:  August 29, 1932  ADDENDUM  DATE OF ADMISSION:  08/21/2013 DATE OF DISCHARGE:  08/25/2013   ". . .  the patient's discharge summary was interrupted, so I am dictating addendum."  As mentioned above, the patient has seizure episodes. He was noted to be unresponsive initially in the facility which was felt by admitting physician to represent one episode of seizure. Also had one more seizure while en route to the Emergency Room. He had an electroencephalogram done on the 17th of November, 2014, read by Dr. Malvin JohnsPotter, which was unremarkable. Neurologist followed the patient along and recommended to change his Keppra to Vimpat which was done. The patient is to continue Vimpat at 100 mg twice daily dose.   No recurrences of seizures were noted while the patient was in the hospital.   While in the hospital, the patient was also noted to have generalized weakness. He was evaluated by a physical therapist who recommended home health physical therapy. While following the patient's blood count, it was noted that the patient's platelet count is going down, despite therapy, of sepsis. The patient's platelet count on arrival to the hospital the 14th of November 2014,  was 415; however, it declined slowly, progressively, to 140 on the day of discharge, the 19th of November 2014.   WE SUSPENDED THE PATIENT'S HEPARIN PRODUCTS AND SENT OUT HIT TESTING, AND WE WOULD LIKE TO EXTEND WARNING TO PHYSICIANS WHO ARE GOING TO SEE THE PATIENT IN THE FACILITY THAT THE PLATELET COUNT SHOULD BE FOLLOWED VERY CLOSELY, ESPECIALLY SINCE HEPARIN PRODUCTS CAN BE USED OR MAY BE USED IN PICC LINE FLUSHES AND DISCONTINUE HEPARIN FLUSHES IF HIT TESTING IS POSITIVE OR PLATELET COUNT DROPS DOWN (Dictation Anomaly)<<MISSING TEXT>> IF (Dictation Anomaly)<<MISSING TEXT>> WAS NOTED TO BE 140,000 ON THE 19TH OF NOVEMBER 2014.   The patient had subclavian central line placed on the left on  the 14th of November 2014 which will be discontinued and PICC line placed on the 20th of November 2014 for IV antibiotic use for 7 more days which will also be discontinued after antibiotics are infused.   The patient is being discharged in stable condition with the above-mentioned medications and follow-up.   DISCHARGE VITAL SIGNS: Temperature was 97.9. Pulse was 99. Respiratory rate was 18, blood pressure 132/78, saturation was 97% on room air at rest.   TIME SPENT: 40 minutes.   ____________________________ Katharina Caperima Dustee Bottenfield, MD rv:np D: 08/26/2013 15:35:00 ET T: 08/26/2013 15:50:56 ET JOB#: [id number]  cc: Katharina Caperima Iara Monds, MD, <Dictator>

## 2015-01-27 NOTE — Consult Note (Signed)
Pt CC: dysphagia.  Esoph rings seen, focal erosive gastritis.  Could go home and follow up as out pt or could stay and have colonoscopy tomorrow for diarrhea.  Will ask patient.  Electronic Signatures: Scot JunElliott, Robert T (MD)  (Signed on 07-Aug-14 13:44)  Authored  Last Updated: 07-Aug-14 13:44 by Scot JunElliott, Robert T (MD)

## 2015-01-27 NOTE — H&P (Signed)
PATIENT NAME:  Phillip Osborne, Phillip Osborne MR#:  [id number] DATE OF BIRTH:  July 24, 1932  DATE OF ADMISSION:  08/21/2013  PRIMARY CARE PHYSICIAN: Dr. Karrie DoffingKylluru.  REFERRING PHYSICIAN:  Dr. Carollee MassedKaminski.  CHIEF COMPLAINT: Unresponsiveness.   HISTORY OF PRESENT ILLNESS: The patient is a 81 year old African American male with a past medical history of chronic renal insufficiency, dementia, diabetes mellitus and other medical problems, residing at [facility], was found unresponsive with no pulse and no respirations by the medical staff when they went to check his vital signs. Staff checked his CODE STATUS; as it was DO NOT RESUSCITATE, they did not initiate any cardiopulmonary resuscitation, but the patient spontaneously regained his pulse and started breathing. They called EMS immediately to bring him into the ER. On his way to the ER, the patient started seizing and has received 2 mg of Versed in IV form. By the time the patient came into the ER, he was again unresponsive, probably because of the postictal state. His pupils were fixed, and blood pressure was really low. The patient was given back-to-back fluid boluses and eventually he was started on Levophed GTT.  After 5 liters of fluid boluses and with Levophed drip, the patient's blood pressure started improving. The patient also became responsive and started following verbal commands. Also, he was communicating and went back to his baseline. His chest x-ray has revealed no pneumonia, but urine was positive for urinary tract infection. The patient has received IV Rocephin after blood cultures and urine cultures were obtained. Hospitalist team is called to admit the patient. Family members were notified, and they came to the ER. The patient is very hard of hearing and a poor historian in view of dementia, but following verbal commands and answering questions. He denies any pain. Stated that he is feeling much better. He was able to tell his name and the place. No other  complaints.   PAST MEDICAL HISTORY: Chronic renal insufficiency, diabetes mellitus type 2, hypertension, history of seizures, alcohol abuse, dementia.   PAST SURGICAL HISTORY: None.   In the ER, the patient had femoral artery puncture while attempting to put a central line in the femoral area, but the bleeding was controlled by applying pressure. Left subclavian vein was placed by the surgeon.   ALLERGIES: ACE INHIBITORS.  PSYCHOSOCIAL HISTORY: Residing at [facility]. No history of tobacco or drug abuse, according to old records. He used to drink heavily, but stopped drinking after he was admitted to [facility].   FAMILY HISTORY: Sister has diabetes mellitus.   REVIEW OF SYSTEMS:  Unobtainable, as the patient has at baseline dementia and very hard of hearing.   HOME MEDICATIONS: Rispiridone 0.25 mg 1 tablet p.o. 2 times a day, Protonix 40 mg p.o. once daily, metoprolol succinate 50 mg once daily, 5 mg once daily, Keppra 500 mg p.o. q.12 hours, haloperidol 1 mg p.o. every 3 to 4 hours as needed for agitation, folic acid 1 mg p.o. once daily, Cymbalta 30 mg once daily, Cipro 250 mg every 12 hours, Ativan 1 mg p.o. q. 6 hours, aspirin 81 mg once daily, allopurinol 100 mg once daily.   PHYSICAL EXAMINATION:  VITAL SIGNS:  Pulse is 116, blood pressure 95/54, respirations 18, pulse oximetry is 100%, temperature 98.4.  GENERAL APPEARANCE: Not in any acute distress. Moderately built and nourished.  HEENT: Normocephalic, atraumatic. Pupils are equally reacting to light and accommodation. They are 3 mm in size. No scleral icterus. No conjunctival injection No sinus tenderness. Dry mucous membranes.  Uvula is midline.  NECK: Supple. No JVD. No thyromegaly.  LUNGS: Clear to auscultation bilaterally. No accessory muscle use and no anterior chest wall tenderness on palpation. No peripheral edema.  CARDIAC: S1, S2 normal. Regular rate and rhythm. No murmurs.  GASTROINTESTINAL: Soft. Bowel sounds are  positive in all four quadrants. Nontender, nondistended. No hepatosplenomegaly. No masses felt.  NEUROLOGIC: Awake, alert, oriented to place and person. Following verbal commands. EXTREMITIES: No edema. No cyanosis. No clubbing. No peripheral edema. Peripheral pulses, dorsalis pedis and posterior tibialis are intact.  SKIN: Warm to touch. Dry in nature. Decreased turgor. No rashes. No lesions.  INGUINAL: Groin area with a pressure dressing. The bleeding is well controlled.  PSYCHIATRIC:  Mood and affect could not be elicited, as the patient is with dementia.  LABORATORIES AND IMAGING STUDIES: Chest x-ray, portable: No acute findings. CAT scan of the head without contrast has revealed no acute intracranial pathology seen to explain the patient's symptoms; mild to moderate cortical volume loss and scattered small vessel ischemic microangiopathy; high density polyp or solid mass in the left side of the sphenoid sinus and bony expansion which measures 3.6 x 2.8 cm, slightly increased in size from the recent prior study. As before, ENT evaluation is recommended if not previously fully assessed. Mild partial opacification of the left maxillary sinus. 12-lead EKG: Sinus tachycardia with premature ventricular complexes, possible left atrial enlargement.   The first set of cardiac enzymes is negative. WBC 8.6, hemoglobin 7.8, hematocrit 22.7, platelets 415. Urinalysis yellow in color, cloudy in appearance, glucose negative, bilirubin negative, ketones negative, specific gravity 1.014, nitrite negative, leukocyte esterase trace, white blood cell clumps are present. BMP: Glucose 247, BUN 39, creatinine 2.79. Baseline seems to be at 1.5. Sodium 135, potassium 4.4, chloride 106, CO2 16. GFR 24. Anion gap 13, serum osmolality 288. Calcium 9.1.   ASSESSMENT AND PLAN: A 81 year old [place] American male, brought into the ER after he was found unresponsive at nursing home, regained his consciousness spontaneously and  seized on his way to the ER. Will be admitted with following assessment and plan.   1.  Systemic inflammatory response syndrome, probably from acute cystitis. We will admit him to Critical Care Unit. Continue fluids  as the patient is hypotensive.  2.  Continue Levophed GTT to titrate for a mean arterial pressure of 60 to 65. Pan cultures were obtained.  3.  IV Rocephin and be provided. 4.  Acute kidney chronic on renal insufficiency, probably from dehydration. We will provide him IV fluids and monitor renal function closely. Will consider renal consult if there is no improvement.  5.  Breakthrough seizures. Will resume his Keppra and will provide him Ativan on as needed basis for breakthrough seizures. Neurology consult is placed to Dr. Jeral Pinch, who is on call. 6.  Diabetes mellitus. The patient will be on sliding scale insulin.  7.  Chronic history of dementia.  8.  Hypertension. Currently the patient is hypotensive, in view of sepsis.   He is DO NOT RESUSCITATE.   Diagnosis and plan of care were discussed in detail with the patient.   TOTAL CRITICAL CARE TIME SPENT: 60 minutes    ____________________________ Ramonita Lab, MD ag:cg D: 08/21/2013 00:39:57 ET T: 08/21/2013 01:40:30 ET JOB#: [id number]  cc: Ramonita Lab, MD, <Dictator> Dr. Wendi Maya MD ELECTRONICALLY SIGNED 09/02/2013 7:24

## 2015-01-27 NOTE — Op Note (Signed)
PATIENT NAME:  Richardson LandryMITCHELL, Campbell R MR#:  [id number] DATE OF BIRTH:  06-21-1932  DATE OF PROCEDURE:  08/20/2013  OPERATION PERFORMED:  Left subclavian vein central venous catheter placement.   PREOPERATIVE DIAGNOSIS:  Hypotension.   POSTOPERATIVE DIAGNOSIS:  Hypotension.  SURGEON:  Claude MangesWilliam F. Lianne Carreto, M.D.   ANESTHESIA:  Local.   PROCEDURE IN DETAIL:  The patient was placed in the Trendelenburg position and a left subclavian vein central venous catheter was placed with double Seldinger technique after prepping and draping the patient in the usual sterile fashion.  The catheter went in easily and was secured to the skin with the supplied 3-0 silk sutures and dressed by the nurse.  It withdrew blood easily and flushed with heparinized saline easily.  Portable chest x-ray is pending at this time.    ____________________________ Claude MangesWilliam F. Elfego Giammarino, MD wfm:ea D: 08/20/2013 21:47:28 ET T: 08/21/2013 00:21:37 ET JOB#: [id number]  cc: Claude MangesWilliam F.  [place], MD, <Dictator> Claude MangesWilliam F. Jaquisha Frech, MD Claude MangesWILLIAM F Celine Dishman MD ELECTRONICALLY SIGNED 08/22/2013 21:06

## 2015-01-27 NOTE — H&P (Signed)
PATIENT NAME:  Phillip Osborne, Phillip Osborne MR#:  [id number] DATE OF BIRTH:  June 09, 1932  DATE OF ADMISSION:  05/09/2013  PRIMARY CARE PHYSICIAN:  Dr. Leavy CellaBlocker at [facility]   REFERRING PHYSICIAN:  Dr. Fanny BienQuale   CHIEF COMPLAINT:  Diarrhea.   HISTORY OF PRESENT ILLNESS: The patient is a pleasant, 81 year old African-American male with a history of diabetes, hypertension, seizure disorder, and alcoholic seizure and withdrawals in the past, CKD stage III. He, of note, presents with a 2-week history of diarrhea off and on, up to 2 to 4 episodes per day, loose, brown, watery. He had no sick contacts, and denies taking any recent antibiotics. He states that he has had a recent scan of his abdomen as an outpatient. He came in here with persistent symptoms, poor p.o. intake, and poor appetite. He was noted to have hyperkalemia with a significant acidosis, with a lactate of 5, with a serum CO2 of 16. He also drinks heavily, about a pint of vodka a day. He states that his last drink was yesterday. He was also noted to have some minor tremors and some signs of withdrawal, and he was given some Ativan. A CT of abdomen and pelvis was repeated with contrast p.o., which shows no colitis. Hospitalist services were contacted for further evaluation and management.   PAST MEDICAL HISTORY: History of alcoholic seizures, seizure disorder, hypertension, diabetes, CKD stage III, alcohol withdrawal, alcoholic DTs.   SOCIAL HISTORY:  No tobacco or drug use. Lives by himself, and occasionally girlfriend stays with him. He drinks a pint of vodka a day, most days. Last drink yesterday.   ALLERGIES: ACE INHIBITOR, LISINOPRIL, NORVASC.   OUTPATIENT MEDICATIONS:  Acetaminophen/tramadol 325/37.5 mg 1 tab every 6 to 8 hours as needed for pain, allopurinol 100 mg daily, amlodipine 10 mg daily, aspirin 81 mg daily, hydrochlorothiazide 12.5 mg daily, Keppra 500 mg 2 times a day, magnesium oxide 400 mg 2 times a day, metoprolol extended release 50 mg  once a day, NovoLog 70/30 mix, he told me he takes about 14 units in the morning and about 8 units at night, sildenafil [name]   FAMILY HISTORY:  Diabetes runs in a sister.   REVIEW OF SYSTEMS:  CONSTITUTIONAL: No fever, but has occasional chills. Had a 7-pound weight loss recently in the last 6 months.  HEENT:  No blurry vision or double vision. Has had eye surgery on the right.  EARS, NOSE, THROAT:  No tinnitus, hearing loss, postnasal drip.  RESPIRATORY:  No cough, wheezing, shortness of breath or hemoptysis.  CARDIOVASCULAR:  No chest pain, swelling in the legs or arrhythmia. Has hypertension. GASTROINTESTINAL:  One episode of vomiting today, but otherwise has had no significant vomiting, while he did have the diarrhea. No abdominal pain. No dark stools. No bloody stools. Possibly one episode of red stuff in the stool, but otherwise no blood reported. Diarrhea is brown.   GENITOURINARY:  Denies frequency, but has occasional dysuria.  HEMATOLOGIC/LYMPHATIC:  No anemia or easy bruising.  SKIN:  No rashes.  MUSCULOSKELETAL:  Denies acute gout, but has some right shoulder pain after a fall a couple of months ago. NEUROLOGIC:  History of seizures.  PSYCHIATRIC:  Denies anxiety or insomnia.   PHYSICAL EXAMINATION: VITAL SIGNS: Temperature on arrival 97.4, pulse rate 77, respiratory rate 18, blood pressure 103/57, O2 sat 97% on room air.  GENERAL:  The patient is a well-developed, African-American male lying in bed in no obvious distress.  HEENT: Normocephalic, atraumatic. Pupils are equal and reactive.  Anicteric sclerae. Very dry mucous membranes.  NECK:  Supple. No thyroid tenderness. No cervical lymphadenopathy.  CARDIOVASCULAR: S1, S2. Distant heart sounds, but no significant murmurs could be appreciated.  LUNGS:  Clear to auscultation, without wheezing, rhonchi or rales.  ABDOMEN:  Hyperactive bowel sounds, but nontender. No rebound or guarding.  EXTREMITIES:  No significant lower  extremity edema.  NEUROLOGIC: Cranial nerves II through XII grossly intact. Strength is 5/5 in all extremities. Sensation is intact to light touch. The patient does have mild tremors on stretching of his hands.  SKIN:  No obvious rashes.  PSYCHIATRIC:  Awake, alert, oriented x 3.   LABS AND IMAGING: CT of abdomen and pelvis shows no evidence of obstructive or inflammatory abnormalities. He does appear to have gastric diverticulum, a small hiatal hernia. No abdominal aortic aneurysm.  Glucose 123, BUN 35, creatinine 1.54, sodium 131, potassium 5.7, serum CO2 is 16, GFR 49. Alcohol percentage is 0.068. Lipase 133, calcium 9.2, total protein 9,  otherwise LFTs are within normal limits. WBC 5, hemoglobin 15.4, platelets are 238. Stool cultures so far showed no Campylobacter. VBG showed pH of 7.2, pCO2 of 40, lactic acid of 5.   EKG: Normal sinus rhythm, rate is 78, left axis deviation, no acute ST elevations or depressions.   ASSESSMENT AND PLAN:  We have a 80 year old male with history of alcoholic withdrawal and seizures, who is on Keppra; diabetes, chronic kidney disease stage III, hypertension, who presents with subacute diarrhea for 2 weeks without sick contacts or recent antibiotics, per him. He has had some weight loss and has poor p.o. intake, and appears to be dehydrated, with lactic acidosis, and will be admitted to the hospital. We would follow with the stool cultures, which have been sent. He does not appear to have a colitis. He has no fever or leukocytosis. We have also sent a C. diff, and were waiting for that. In the meantime, we will go ahead and start the patient on some IV fluids, and obtain a GI consult.   In regards to the hyperkalemia, although he is having diarrhea, his potassium is significantly elevated to 5.7. We would hold the hydrochlorothiazide, give the patient a one-time half dose Kayexalate at 15 gram dose, and recheck another BNP later tonight. Also give the patient  some  insulin and D50. He does appear to have significant metabolic acidosis, which could be secondary to dehydration as well as ethanol. We would start the patient on some IV fluids. He does have some dysuria, and will go ahead and check a UA and urine cultures, as he has had no UA so far. Would continue his diabetes medications at a lower dose, as he will be started on clears because of his significant diarrhea, and would advance the diet as tolerated. We would also continue the Keppra, the metoprolol.   In regards to withdrawal, he has alcohol on board, he has had history of withdrawals and DTs and seizures. He is at a high risk for developing further withdrawals, and therefore would start him on a CIWA protocol, multivitamin, folate, thiamine, and standing Librium for now. We will start him on DVT prophylaxis with heparin.  The patient is a FULL CODE.   Total time spent is 65 minutes.     ____________________________ Krystal Eaton, MD sa:mr D: 05/09/2013 17:21:00 ET T: 05/09/2013 19:07:00 ET JOB#: [id number]  cc: Krystal Eaton, MD, <Dictator> Krystal Eaton MD ELECTRONICALLY SIGNED 05/30/2013 12:25

## 2015-01-27 NOTE — H&P (Signed)
PATIENT NAME:  Phillip Osborne, Phillip Osborne MR#:  [id number] DATE OF BIRTH:  17-Mar-1932  DATE OF ADMISSION:  05/25/2013  REFERRING EMERGENCY ROOM PHYSICIAN: Dr. Malachy Moan.   PRIMARY CARE PHYSICIAN: Dr. Leavy Cella.   CHIEF COMPLAINT: Altered mental status.   HISTORY OF PRESENT ILLNESS: The patient is a 81 year old male who has past history of chronic alcoholism; seizure disorder; hypertension; diabetes;  CKD stage III; was in hospital in the first week of August and stayed almost a week for his complaint of diarrhea and then due to generalized weakness, he was sent home with home health aide.   As per his daughter, who is present in the room currently, he had generalized weakness physically, but mentally he was at his baseline which is he makes sense what he is talking about and with some episodes of going in and out of confusion. He was like that until three days ago, which was August 15. On August 16, when daughter went to his home to visit him, she noticed that the patient was very agitated and angry and he was trying to threaten the people to get alcohol, which he was a chronic alcoholic but is not allowed to drink since he went home last two weeks, and because of this behavior, they called the police and the police brought him to the Emergency Room and the patient was kept under behavior observation in the ER.   He was placed under involuntary commitment and observed by psych for his psych issues. For the last two days, he stayed under psych management, and then today his daughters came to see him for the first time. They found that he is still very confused and calm but not to his baseline, and so the ER physician did repeat workup, which was done. On admission, chest x-ray was clear, but now today chest x-ray shows some infiltrate, possible pneumonia on chest x-ray, and which might be the cause of his confusion and altered mental status, so he is being admitted to medical service for further management.    On further questioning with family, they deny knowing anything about his complaining of cough or fever or any urinary symptoms.   REVIEW OF SYSTEMS: Unable to get as the patient is confused.   PAST MEDICAL HISTORY: Got from the medical records and the patient's daughters.  Alcoholic seizure; chronic alcoholism; seizure disorder; hypertension; diabetes; CKD stage III; alcohol withdrawal; alcoholic DTs.   SOCIAL HISTORY: No tobacco or drug abuse. Lives by himself. Occasionally stays with girlfriend and was drinking a pint of vodka almost every day till last admission to the hospital. After last admission, home with home health aide and not allowed to drink.   FAMILY HISTORY: Diabetes runs in sister.  HOME MEDICATIONS:  1.  Pantoprazole 40 mg once a day.  2.  NovoLog 70/30, give 14 units subcutaneous once a day.  3.  Metoprolol 50 mg extended release once a day.  4.  Keppra 500 mg two times a day.  5.  Hydrochlorothiazide 12.5 mg oral tablet once a day.  6.  Aspirin enteric-coated 81 mg once a day.  7.  Allopurinol 100 mg once a day.  8.  Acetaminophen and tramadol 1 tablet every 6 to 8 hours as needed for pain.   PHYSICAL EXAMINATION:  VITAL SIGNS: In ER, temperature 97.4. Pulse rate has been ranging from 120 to 200, respiratory rate 18 to 22, blood pressure 108/67, and pulse oximetry 96 on room air.  GENERAL: Alert but not  very well oriented. He appears slightly confused. He opens his eyes and speaks to me with very deep and non-identifiable voice even by family. Overall appears very weak and mentally slow. He does not appear in any acute distress.  HEENT: Head and neck atraumatic. Conjunctivae pink. Oral mucosa moist.  NECK: Supple. No JVD.  RESPIRATORY: Bilateral clear and equal air entry.  CARDIOVASCULAR: S1, S2 present, regular. Slight tachycardia. No murmur.  ABDOMEN: Soft, nontender. Bowel sounds present. No organomegaly.  SKIN: No rashes.  MUSCULOSKELETAL: Legs: No edema.  Joints: No swelling or tenderness.  NEUROLOGICAL: Power 4/5 in all four limbs. Slight rigidity present. He follows commands. No tremors.   IMPORTANT LABORATORY RESULTS:  On presentation to the ER on August 16, creatinine was 2.46, which came down today to 1.08. Potassium level 3.3 today. Liver function tests are within normal limits on presentation. On August16, urine for drug toxicology was present for benzodiazepine. White cell count was 4.9, which is 7.7 today. Hemoglobin is stable at 13.3, platelet count 300. Blood cultures were negative. Urinalysis on arrival to ER on August 16 was  negative. Chest x-ray with no acute cardiopulmonary disease on August 17; which is on August 19 today, mild infiltrate, left lung base. CT of the head without contrast on August 16, no acute intracranial abnormality. Changes of chronic atrophy.   ASSESSMENT AND PLAN: A 81 year old male with past history of chronic alcoholism;  seizure disorder; hypertension; diabetes;  chronic kidney disease stage III; was recently in the hospital with diarrhea and sent home with home health aide. Came with altered mental status and since the last two days in the Emergency Room, now found having pneumonia.  1.  Pneumonia. Was in the hospital for a long time, more than a week recently, and so we will give broad-spectrum antibiotic coverage and we will do blood culture and sputum culture.  2.  Altered mental status. Metabolic encephalopathy due to infection. Maybe chronic alcoholism and not drinking now playing a role or it might be chronic issue due to aging. He is on involuntary commitment currently and psychiatry is following him. Will also call neurology consult for further management.  3.  Hypokalemia. We will replace orally.  4.  Acute renal failure on presentation to the Emergency Room, which is resolved now.   5.  Seizure disorder. He is on Keppra currently.  6.  Hypertension. We will continue his home medications.   CODE STATUS:  FULL CODE.   His daughters are not sure about his wishes. They want to decide it after discussing with other siblings, so for now he is full code.   TOTAL TIME SPENT ON THIS ADMISSION: 50 minutes    ____________________________ Hope PigeonVaibhavkumar G. Elisabeth PigeonVachhani, MD vgv:np D: 05/25/2013 17:12:00 ET T: 05/25/2013 17:30:39 ET JOB#: [id number]  cc: Hope PigeonVaibhavkumar G. Elisabeth PigeonVachhani, MD, <Dictator> Altamese DillingVAIBHAVKUMAR Gerrica Cygan MD ELECTRONICALLY SIGNED 06/11/2013 19:02

## 2015-01-27 NOTE — Discharge Summary (Signed)
PATIENT NAME:  Phillip Osborne, Phillip Osborne MR#:  [id number] DATE OF BIRTH:  1932/05/17  DATE OF ADMISSION:  08/21/2013 DATE OF DISCHARGE:  08/26/2013  ADMITTING DIAGNOSIS: Sepsis.   DISCHARGE DIAGNOSES: 1.  Escherichia coli extended-spectrum beta-lactamase sepsis.  2.  Septic shock due to Escherichia coli infection.  3.  Acute pyelonephritis.  4.  Acute on chronic renal failure, resolved.  5.  Dehydration.  6.  Anemia of chronic disease as well as iron deficiency anemia status post 2 units packed red blood cell transfusion.  7.  Seizures. 8.  Dementia with agitation, also metabolic encephalopathy versus delirium, resolved.  9.  Dysphagia, now on dysphagia I diet with nectar thick liquids.  10.  Generalized weakness.  11.  Thrombocytopenia suspected due to heparin products, HIT is still pending.  PROCEDURES: Status post left subclavian vein central line placement on 08/20/2013 by Dr. Anda Kraft as well as PICC line placement on 08/26/2013.  DISCHARGE CONDITION: Stable.   DISCHARGE MEDICATIONS: The patient is to resume: 1.  Aspirin 81 mg p.o. daily. 2.  Folic acid 1 mg p.o. daily. 3.  Allopurinol 100 mg p.o. daily. 4.  Metoprolol succinate 50 mg p.o. daily. 5.  Cymbalta 30 mg p.o. daily. 6.  Ativan 1 mg p.o. every 6 hours as needed. 7.  Haloperidol 1 mg every 3 hours as needed.  8.  Memantine 5 mg p.o. daily. 9.  Tylenol 325 mg 2 tablets every 5 hours as needed. 10.  MAPAP 325 mg 2 tablets every 6 hours as needed.  11.  Haloperidol 2 mg 3 times daily.  12.  Guiatuss 10 mL every 6 hours as needed.  13.  Ativan 1 mg at bedtime. 14.  Vimpat (lacosamide) 100 mg p.o. twice daily.  15.  Insulin Levemir 10 units subcutaneously daily.  16.  Sliding scale insulin.  17.  Meropenem 1 gram twice daily IV for 7 more days.  18.  Docusate sodium 100 mg p.o. twice daily.  19.  Pantoprazole 40 mg p.o. twice daily.  20.  Line flushes.  NOTE: The patient is not to take risperidone, Keppra, and insulin  70/30 unless recommended by primary care physician or MD at facility.  HOME OXYGEN: None.   DIET: 2 grams salt, low fat, low cholesterol, carbohydrate-controlled diet. Dietary supplements: Sugar-free Carnation Instant Breakfast up to 2 times a day. Diet consistency: Pureed nectar thick liquids, strict aspiration precautions. Medications in pureed, crushed. Assistance with feedings at all meals.   ACTIVITY LIMITATIONS: As tolerated.   REFERRALS: To physical therapy, speech therapy, and dietary as well as occupational therapy.  DISCHARGE INSTRUCTIONS: Followup appointment with Dr. Wynelle Link in 2 days after discharge, also facility physician.  HISTORY OF PRESENT ILLNESS: The patient is a 81 year old African American male with history of chronic renal insufficiency, dementia, diabetes mellitus, and other medical problems who resides in [facility] who was found unresponsive and with pulse and no respirations by medical staff. He was not resuscitated, however, the patient spontaneously regained his pulse and started breathing. EMS was called and the patient was brought to the Emergency Room for further evaluation. In the Emergency Room, he had a seizure and was given Versed IV.   On arrival to the Emergency Room, the patient was hypotensive and was initiated on pressors, Levophed, as well as IV fluids. Hospitalist services were contacted for admission. Physical examination was unremarkable.   The patient's lab data initially on 08/20/2013 revealed elevation of BUN and creatinine to 39 and 2.79, sodium 135 glucose  247, and bicarbonate level was 16. Otherwise, BMP was unremarkable. Liver enzymes revealed albumin level of 2.5, elevated alkaline phosphatase to 223, and AST of 39. Otherwise, liver enzymes were normal. Cardiac enzymes, 1 set, was unremarkable. The patient's white blood cell count was normal at 8.6, hemoglobin was 7.8, and platelet count was 415. The patient's blood cultures were taken on  08/20/2013 and they grew 4 out of 4 bottles E. coli, ESBL, resistant to all antibiotics except imipenem as well as ertapenem and intermittently sensitive to cefoxitin. The patient was initiated on Rocephin with no significant improvement of his condition. Then it was changed to Zosyn, however, when the patient's blood cultures as well as urine cultures came back for E. coli ESBL the patient was switched to meropenem. Of note, the patient's urine culture also showed the same, E. coli, more than 100,000 colony-forming units ESBL. Urinalysis was markedly abnormal. White blood cell count was 134, leukocyte esterase 3+, bacteria 2+, and white blood cell clumps as well as mucus were present.   HOSPITAL COURSE: The patient was admitted to intensive care unit for IV fluid administration. On 08/21/2013, early in the morning, the patient's creatinine was 3.13. It however improved as time progressed with IV fluids as well as pressors and by the day of discharge, 08/26/2013, it was at baseline. On 08/25/2013, the patient's creatinine was checked and was found to be 1.36 and BUN was normal at 16. It was felt that the patient's kidney function was abnormal due to likely acute pyelonephritis as well as significant infection, ESBL E. coli infection, as well as hypotension. The patient had ultrasound of his kidneys done bilaterally, 08/21/2013, which showed no hydronephrosis, but mildly increased renal echogenicity which may represent medical renal disease as well as bilateral renal cysts, bilateral pleural effusions and trace of fluid in Morison pouch.   On arrival to the Emergency Room, the patient also had a chest x-ray done, portable single view, 08/20/2013, that showed no acute findings. CT scan of the head, since he was poorly responsive and has a seizure, also showed no acute intracranial abnormality. Mild to moderate cortical volume loss as well as chronic small vessel ischemic microangiopathy was noted. High-density  polyp or solid mass of the left side of sphenoid sinus with bony expansion was noted. It measured 3.6 x 2.8 cm, slightly increased in size from prior recent study. ENT was recommended if it was not previously done. Mild partial opacification of left maxillary sinus was also noted.   The patient was admitted to the hospital for further evaluation. As mentioned above, he was treated for multiple medical issues.  First, in regard to sepsis, it was felt that the patient's sepsis was originating from acute pyelonephritis. The patient was treated with multiple antibiotics and when the patient's cultures came back E. coli ESBL he was changed to meropenem with good response. With IV fluids as well as antibiotics, the patient's kidney function as well as blood pressure improved and his septic shock resolved. He was felt to be severely dehydrated. With rehydration however he was noted to be anemic. His hemoglobin level was noted to be low when he presented to the Emergency Room at 7.8, on 08/20/2013. However, it dropped down even lower to 5.6 on 08/22/2013. The patient was transfused with 2 units of packed red blood cells after which the patient's hemoglobin level normalized and remained stable. No active bleeding was noted. Iron studies were performed and the patient was noted to have iron deficiency anemia  as well as anemia of chronic disease. The patient would benefit from iron supplementation as well as possibly Epogen in the future. It is recommended to follow the patient's blood count as outpatient to make sure that it is not decreasing. Meanwhile, the patient is to continue proton pump inhibitors twice a day. However, again no bleeding was noted. Guaiac of stool was ordered, however, not received during his stay in the hospital.  In regards to dementia with agitation, the patient was continued on Haldol as needed. However, because of his altered mental status and somnolence, risperidone was stopped. It is  recommended to slowly restart the patient's dementia and agitation related medications. The patient was evaluated by speech therapist who recommended dysphagia I diet with nectar thick liquids.   In regards to seizure activity, the patient was evaluated by a neurologist and electroencephalogram was performed. Electroencephalogram done on 08/23/2013 was within normal limits. The patient was seen by a neurologist who recommended changing his Keppra to Vimpat. The patient is to continue Vimpat at 100 mg twice daily dose. No recurrence of seizure was noted while he was in the hospital.  ____________________________ Katharina Caperima Yousif Edelson, MD rv:sb D: 08/26/2013 15:52:33 ET T: 08/26/2013 16:38:03 ET JOB#: [id number]  cc: Katharina Caperima Tremaine Earwood, MD, <Dictator> Tysen Roesler MD ELECTRONICALLY SIGNED 09/14/2013 15:36

## 2015-01-27 NOTE — H&P (Signed)
PATIENT NAME:  Phillip Osborne, Phillip Osborne MR#:  [id number] DATE OF BIRTH:  May 06, 1932  DATE OF ADMISSION:  07/23/2013  PRIMARY CARE PHYSICIAN:  Dr. Juleen China   CHIEF COMPLAINT: Fall.   HISTORY OF PRESENT ILLNESS: The patient is a 81 year old African American male with a past medical history of chronic renal insufficiency, residing at [facility], with a history of dementia. He was brought into the ER after he sustained a fall. The patient went to the bathroom, and when he was by himself in the bathroom he fell and hit his head on the sink. Because of the fall and laceration, he was brought into the ER. The laceration was repaired. CAT scan of the head was negative. CAT scan of the neck also revealed no fractures. The patient was diagnosed with acute sinusitis on CT of the head. The patient was diagnosed with acute cystitis on October 17, and he was started on p.o. levofloxacin for acute cystitis. In the ER, the patient seemed to be dehydrated. Renal function is abnormal with BUN 70 and creatinine 3.28.  His BUN was at 34 and creatinine 1.54 back in September 2014. The patient's potassium is elevated at 6.5 with no EKG changes. The patient has reported no chest pain, shortness of breath to the daughters. The patient was given sodium bicarbonate, D50, and insulin in the ER. During my examination, the patient is resting comfortably and two daughters were at bedside. The patient has a chronic history of alcohol abuse, which was stopped by him recently, but because of the chronic alcoholism he has chronic laryngitis and he usually whispers. No other complaints. No history of nausea, vomiting, diarrhea or dizziness.   PAST MEDICAL HISTORY: Chronic renal insufficiency, diabetes mellitus type 2, hypertension, history of seizures, alcohol abuse, dementia.  PAST SURGICAL HISTORY: None.   ALLERGIES: ACE INHIBITORS, LISINOPRIL, NORVASC.    PSYCHOSOCIAL HISTORY: Currently residing at [facility]. The patient has no  history of tobacco or drug abuse. He used to drink alcohol heavily and has stopped drinking recently after he was admitted to [facility].   FAMILY HISTORY: Diabetes runs in his sister.   HOME MEDICATIONS: Pantoprazole 40 mg once daily, NovoLog 70/30, 14 units subcutaneous once daily, metoprolol succinate 250 mg 1 tablet once daily, levetiracetam 500 mg 2 times a day, Levaquin 750 mg subcu q.24h., hydrochlorothiazide 12.5 mg once daily, haloperidol 2 mg 3 times a day, folic acid 1 mg once daily, Cymbalta 30 mg once daily, Ativan 1 mg p.o. q.6 hours as needed, aspirin 81 mg once daily, allopurinol 100 mg once a day.   REVIEW OF SYSTEMS:  Unobtainable, as the patient is with baseline dementia.   PHYSICAL EXAMINATION:  VITAL SIGNS: Temperature 97.9, pulse 72, respirations 20, blood pressure 106/77, pulse ox 94% on room air.  GENERAL APPEARANCE: Not in acute distress. Moderately built and nourished.  HEENT: Normocephalic. Pupils are equally reactive to light and accommodation. No scleral icterus. No conjunctival injection. Laceration at the nasal edge of the left eyebrow is repaired; no acute bleeding is noticed. No sinus tenderness. Dry mucous membranes. Uvula is midline. No angioedema.  NECK: Supple. No JVD or thyromegaly.  LUNGS: Clear to auscultation bilaterally. No accessory muscle use. No anterior chest wall tenderness on palpation. No peripheral edema.  CARDIAC: S1, S2 normal. Regular rate and rhythm.  GASTROINTESTINAL: Soft. Bowel sounds are positive in all four quadrants. Nontender, nondistended. No hepatosplenomegaly. No masses felt.  NEUROLOGIC: Awake, alert, but disoriented, not following verbal commands, but opens his  eyes to his name, and whispers and mumbles.  Reflexes are 2+. EXTREMITIES: No edema. No cyanosis. No clubbing. No peripheral edema. Peripheral pulses, dorsalis pedis and posterior tibialis, are intact.  SKIN: Warm to touch. Decreased turgor. No rashes. No lesions.   PSYCHIATRIC: Normal mood and affect could not be elicited as the patient is with dementia.  LABORATORIES AND IMAGING STUDIES: CT of the head without IV contrast has revealed no acute intracranial process. Soft tissue attenuating mass in the left sphenoid sinus with osseous expansion without osseous destruction, unchanged. Air-fluid levels in the left maxillary sinus correlate with acute sinusitis.   CT of the cervical spine: No acute fracture.   Serum glucose is 372, BUN 73, creatinine 3.28, sodium 134, potassium 6.5, chloride 105, CO2 27. GFR is 20. Anion gap 3. Serum osmolality 306.  Calcium is 10.0. Total protein 8.5, albumin 3.3, total bilirubin 2.2, alkaline phosphatase 162, AST 25, ALT 28.  WBC 6.0, hemoglobin 13.6, hematocrit is (Dictation Anomaly) <<MISSING TEXT>> . ESR 10.1, platelets 339. Urinalysis from October 17: Yellow, cloudy, positive (Dictation Anomaly)<<MISSING TEXT>> , nitrite is negative. WBC clumps are present.   A 12-lead EKG: Normal sinus rhythm at 79 beats per minute, normal PR interval, no acute ST-T wave changes.   ASSESSMENT AND PLAN: A 81 year old [place] American male, brought into the ER from [facility] after he sustained a fall. Will be admitted with the following assessment and plan.  1.  Acute on chronic renal insufficiency with hyperkalemia. No EKG changes. Will admit him to telemetry bed. Provide him IV fluids, Foley catheter. Nephrology consult is placed to Dr. Juleen China.  The patient has received sodium bicarbonate, D50 and insulin in the ER.  Kayexalate is ordered.  Repeat potassium is ordered.  Will check a.m. labs, potassium and magnesium.  2.  Acute sinusitis and acute cystitis. We will provide him IV levofloxacin.  3.  Status post fall and laceration on the nasal aspect of the left eyebrow, which was repaired.  Will continue wound care.  4.  Hypertension. Blood pressure is stable.  5.  Diabetes mellitus. We will provide him insulin sliding scale at this  time.  6.  Chronic history of dementia.  7.  The patient will be provided with GI and deep vein thrombosis prophylaxis with heparin subcutaneous.   CODE STATUS: He is DO NOT RESUSCITATE. Children are medical power of attorney.   Diagnosis and plan of care were discussed in detail with the patient's two daughters at bedside. They verbalized understanding of the plan.   TOTAL TIME SPENT ON ADMISSION: 45 minutes.   ____________________________ Nicholes Mango, MD ag:cg D: 07/26/2013 00:41:06 ET T: 07/26/2013 01:04:12 ET JOB#: [id number]  cc: Nicholes Mango, MD, <Dictator> Mamie Levers, MD

## 2015-01-27 NOTE — Consult Note (Signed)
PATIENT NAME:  Phillip Osborne, Phillip Osborne MR#:  [id number] DATE OF BIRTH:  [birth year]  NEUROLOGICAL CONSULTATION   DATE OF CONSULTATION:  08/21/2013  REFERRING PHYSICIAN:  Internal medicine hospitalist. CONSULTING PHYSICIAN:  Weston Settle, MD  REASON FOR CONSULT: Seizures.   HISTORY OF PRESENT ILLNESS: The patient is a 81 year old African-American male who was admitted to the hospital from nursing home facility. The patient was found to be unresponsive initially, and then he spontaneously came around, only to be seen to have a generalized tonic-clonic seizure en route to the hospital. Apparently, the patient may have a known history of seizures and was on Keppra before at low dose. His blood pressure was found to be very low, requiring Levophed support, and he does have a urinary tract infection with 134 white blood cells, positive leukocyte esterase and 2+ bacteria. His complete metabolic panel is normal except for bicarbonate level was low at 16, glucose was 364, BUN 38, creatinine 3.13, GFR is 20, alkaline phosphatase elevated at 174 and albumin is low at 2.0. He also has anemia with iron level of 8, ferritin level elevated at 1456 and total iron-binding capacity low at 147. CBC is normal except for hemoglobin of 7.8 with an MCV of 93. Blood cultures have been negative so far. Urine cultures are pending. The patient had a CT scan of the brain which I reviewed personally, and it indicates no acute hemorrhages. There is evidence of global atrophy with some mild ventricular dilation as a result. There is no focal hypodensity present and no hemorrhage.   PAST MEDICAL HISTORY:  1. Diabetes.  2. Chronic renal insufficiency.  3. Chronic iron-deficiency anemia.  4. Dementia.   PAST SURGICAL HISTORY: Negative.   CURRENT MEDICATIONS IN THE HOSPITAL: .  1. Ceftriaxone 2 grams IV daily.  2. Keppra 500 mg p.o. b.i.d.  3. Namenda 5 mg daily.  4. Risperdal 0.25 mg b.i.d.  5. Aspirin 81 mg daily.  6. Insulin  sliding scale.  7. Levophed drip.  ALLERGIES: No known drug allergies.   SOCIAL HISTORY: Unobtainable.   FAMILY HISTORY: Unobtainable.   REVIEW OF SYSTEMS: Unobtainable.   PHYSICAL EXAMINATION:  VITAL SIGNS: Blood pressure is 93/54, pulse of 104, temperature 100.4 degrees Fahrenheit.  HEART: Regular rate and rhythm, S1, S2. No murmurs. Telemetry shows normal sinus rhythm. LUNGS: Clear.  NECK: There are no carotid bruits.  NEUROLOGIC: He does awaken and respond, but he has very slurred speech and is somewhat confused and disoriented and does not follow all commands well. Pupils are equal and reactive. Extraocular movements appear to be intact by observation. Face appears to be symmetrical, and tongue is midline without any lacerations. He does move all 4 extremities to command and to pain with withdrawal. There is no Babinski sign. There is no Hoffmann sign. He does squeeze with both hands and does lift both arms equally. He is able to do finger-to-nose bilaterally. Sensory, coordination and gait could not be further assessed beyond this already tested. Reflexes are +1 and symmetrical.   IMPRESSION AND PLAN: The patient presents with 1 witnessed generalized tonic-clonic seizure and possibly another unwitnessed generalized tonic-clonic seizure within a span of 24 hours. His history is not clear, but given the fact that he had been diagnosed with underlying dementia, that is certainly an etiology for seizure disorder. In addition, it appears that he has urosepsis at this time, and that can certainly lead to a septic type of encephalopathy and lower the seizure threshold. At this time, I  recommend changing the Keppra to Vimpat since Keppra has behavioral and psychological side effects which will not be desirable in someone with underlying dementia. In addition, I think he should be receiving intravenous antiepileptics to achieve maximal blood therapeutic levels since he may have some aspect of poor  gastrointestinal absorption at this time due to his illness. I would recommend discontinuing the Namenda since Namenda monotherapy without an acetylcholinesterase inhibitor is not indicated, and at this acute hospitalization stage, he does not require that, especially at this low a dose. I do recommend performing an EEG on Monday. This will show us whether he has any evidence of ongoing seizure activity subclinically or any evidence of a seizure tendency. We may see some global slowing of brain activity due to underlying dementia, and some of that may be related to a postictal state as well, although I think by that time, if he remains seizure-free, we should not expect that to be a factor.   ____________________________ Weston SettleShervin Arleta Ostrum, MD se:lb D: 08/21/2013 11:06:23 ET T: 08/21/2013 11:52:37 ET JOB#: [id number]  cc: Weston SettleShervin Peace Noyes, MD, <Dictator> Weston SettleSHERVIN Bula Cavalieri MD ELECTRONICALLY SIGNED 08/25/2013 9:58

## 2015-01-27 NOTE — Consult Note (Signed)
Pt with CC diarrhea.  He drinks a bottle a day of vodka.  Has hx of diabetes, HTN,  he has dysphagia.  I would like to do an EGD before discharge because of his dysphagia.  Increased chance of early cancer in very heavy drinkers with dysphagia.  Will likely do Wed or Thursday.   Electronic Signatures: Scot JunElliott, Robert T (MD)  (Signed on 04-Aug-14 18:36)  Authored  Last Updated: 04-Aug-14 18:36 by Scot JunElliott, Robert T (MD)

## 2015-01-27 NOTE — Consult Note (Signed)
Brief Consult Note: Diagnosis: Alcohol Dependence, R/O Alcohol induced amnestic disorder.   Patient was seen by consultant.   Recommend further assessment or treatment.   Discussed with Attending MD.   Comments: Pt seen in ED. He remains condused and was not able to answer to questions appropriately. 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 State- [place]. his son and daughter reported that he was not drinking prior to arrival to the hospital. His case was discussed with Ed physician and he can be discharged when he is medically stable.   Plan; pt is not exhibiting any w/d symptoms from alcohol at this time.  He appeared confused which can be due to longstanding use of alcohol. He is not suicidal and homicidal and his family is supportive.  He will be admitted to the medical floor for stabilization at this time.  Electronic Signatures: Rhunette CroftFaheem, Lubertha Leite S (MD)  (Signed 19-Aug-14 16:10)  Authored: Brief Consult Note   Last Updated: 19-Aug-14 16:10 by Rhunette CroftFaheem, Surie Suchocki S (MD)

## 2015-01-27 NOTE — Consult Note (Signed)
PATIENT NAME:  Phillip Osborne, [name]:  [id number] DATE OF BIRTH:  06-19-1932  DATE OF CONSULTATION:  05/10/2013  REFERRING PHYSICIAN: Krystal EatonShayiq Ahmadzia, MD    CONSULTING PHYSICIAN:  Lynnae Prudeobert Elliott, MD/Ayline Dingus A. Arvilla MarketMills, ANP (Adult Nurse Practitioner)  REASON FOR CONSULTATION:  Diarrhea for a 2-week duration.   HISTORY OF PRESENT ILLNESS: This 81 year old patient of Dr[facility] Le[facility] at [facility] was admitted through the Emergency Room for diarrhea. The patient reports that his bowel habits are variable from formed stools to a few loose stools, and this can go back and forth in a week's timeframe. He says for the last 3 days or so he has had loose stools described as watery up to 4 times a day. He has a little discomfort in the lower abdomen right before the bowel movement and seems to resolve soon after the bowel movement. He has noted no blood or melena.  He does have a poor diet, and he has experienced a 7-pound weight loss over the last 5 months without really changing what he is eating. The patient does drink vodka, a bottle a day or more, 5 days out of the week. He reports a little trouble swallowing chicken and some meats in the lower throat-upper esophagus region, very nonspecific. He has noted no impactions, odynophagia. He denies abdominal pain, nausea or vomiting. He did have a remote colonoscopy in 2004 for screening purposes with the findings of a couple of small polyps. He reports he has had no further colonoscopy studies. He denies previous EGD.   PAST MEDICAL HISTORY: 1.  Alcohol abuse.  2.  Seizure disorder.  3.  Hypertension.  4.  Diabetes.  5.  Chronic kidney disease, stage 3.  6.  History of alcohol withdrawal with alcoholic DTs.   MEDICATIONS ON ARRIVAL:  1.  Acetaminophen/tramadol 325/37.5, 1 tablet q. 6 to 8 hours p.r.n. pain as listed, although the patient states he has been warned against taking Tylenol and does not take it.  2.  Allopurinol 100 mg daily.  3.  Amlodipine 10 mg  daily.  4.  Aspirin 81 mg daily.  5.  Hydrochlorothiazide 12.5 mg daily.  6.  Keppra 500 mg twice daily.  7.  Magnesium oxide 400 mg twice daily.  8.  Metoprolol extended release 50 mg once daily.  9.  NovoLog 70/30 mixed 14 units in the a.m. and about 8 units at night. He does check his blood sugars and follows sliding scale.  10.  Sildenafil p.r.n.     ALLERGIES: ACE INHIBITOR-LISINOPRIL CAUSES LIP SWELLING, NORVASC.   FAMILY HISTORY: Negative for colon cancer, colon polyps.   SOCIAL HISTORY: Lives alone, girlfriend is with him often. Positive vodka, a bottle a day most days, history of moonshine use per chart review.   REVIEW OF SYSTEMS: Ten systems reviewed and pertinent positives as noted in chief complaint:  CONSTITUTIONAL: Positive for weight loss in the last 5 months as noted.  GASTROINTESTINAL: As noted in the history of present illness. The patient denies any abdominal pain now, says he feels pretty well. He reports diarrhea has diminished. He has noted no fevers or chills, no sick contacts or antibiotic exposure. Remaining 10 systems  negative.   PHYSICAL EXAMINATION:  VITAL SIGNS: 97.7, 67, 20, 109/70, pulse ox room air is 96%.  GENERAL: Elderly, thin African American male resting in bed, visiting with his family, NAD.  HEENT: Head is normocephalic. Conjunctivae is pale. Oral mucosa is dry and intact.  NECK: Supple without lymphadenopathy.  CARDIAC: S1, S2 without murmur or gallop.  LUNGS: CTA. Respirations are eupneic.  ABDOMEN: Soft, normal bowel sounds, nontender in all quadrants.  EXTREMITIES: Lower extremities without edema, cyanosis or clubbing.  SKIN: Warm and dry without rash.  NEUROLOGICAL:  Cranial nerves II through XII are grossly intact. Fair historian. No tremors noted after CIWA intervention.  PSYCHIATRIC: Alert, oriented x 3, very pleasant, cooperative, looks relaxed.  LABORATORY DATA:   Admission labs 05/09/2013 with glucose 123, BUN 35, creatinine 1.54,  sodium 131, potassium 5.7, CO2 16, calcium 9.2, lipase 133, ethanol 0.068, albumin 4.0, total bilirubin 0.4, alkaline phosphatase 80, ALT 31. WBC 5.0, hemoglobin 15.4, platelets 238.  Stool no growth for comprehensive culture. Urine culture negative.  Repeat laboratory studies 05/10/2013 with BUN 29 to 23, creatinine 1.46 to 1.44, sodium 134 to 140, potassium 5.1 to 5.3, CO2 is 19 to 24, magnesium 1.3. Hemoglobin A1c is 8.0. Hemoglobin is 13.8.   RADIOLOGY: CT of the abdomen and pelvis without IV contrast or oral contrast revealed a small duodenum diverticulum versus a contained ulcer. The colon was unremarkable. There was a small hiatal hernia. No evidence of abdominal aortic aneurysm.   IMPRESSION:  1. The patient is admitted with change in bowel habits with acute diarrhea. The patient reports he has only had diarrhea for about 3 days. Length of duration is unclear. He does report 7-pound weight loss over a 20-month period but admits his diet is very poor. He admits to alcoholism and drinking vodka most days of the week. He has experienced dysphagia for the last 2 to 3 months, especially with meats, and points to the throat and upper esophageal area as location. He denies food impactions or odynophagia. He denies nausea, vomiting or abdominal pain or tenderness on exam. Remote colonoscopy for screening purposes 2004 showed colon polyps. No further polyp colonoscopy.   2.  Possible dmall duoenum diverticulum versus contained ulcer per CT. 3 .  Chronic renal disease with acidosis with metabolic acidosis has improved with hydration. No obstruction of kidneys are noted on CT scan  PLAN: 1.  Will discuss luminal evaluation with Dr. Mechele Collin when clinically feasible. Etiology to r/o bacterial infection for diarrhea, alcholic gastritis/ulcer/small bowel diarrhea. Obtain stool for C. difficile, WBCs, Giardia are pending results. 2. The patient is on oral pantoprazole 40 mg once daily for possible alcoholic  gastritis/ulcer..  3.  Personal history of colon polyps, pathology unknown in 2004. Rule out colon malignancy. 4.  He complains of dysphagia for 2 to 3 months with history of alcoholism and weight loss concerning for risk of stricture/narrowing from malignancy or benign causes.   5.  This case is to be discussed with Dr. Mechele Collin regarding timing of EGD with possible dilatation and colonoscopy.   Thank you for the consultation.        These services provided by Cala Bradford A. Arvilla Market, MS, APRN, BC, ANP, under  collaborative agreement with Scot Jun, MD.  ____________________________ Ranae Plumber Arvilla Market, ANP (Adult Nurse Practitioner) kam:cb D: 05/10/2013 15:31:14 ET T: 05/10/2013 16:04:10 ET JOB#: [id number]  cc: Cala Bradford A. Arvilla Market, ANP (Adult Nurse Practitioner), <Dictator> Rosalyn Gess. Blocker, MD Ranae Plumber. Suzette Battiest, MSN, ANP-BC Adult Nurse Practitioner ELECTRONICALLY SIGNED 05/10/2013 17:39
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
## 2015-01-27 NOTE — Consult Note (Signed)
Referring Physician:  Alric Seton   Primary Care Physician:  Wilson Singer : [facility], [address], [place], [place] [address], [phone]  Reason for Consult: Admit Date: 08-Sep-2013  Chief Complaint: recurrent seizures   History of Present Illness: History of Present Illness:   The patient is a 81 year old African American male (history from pt/grand-daughter and chart review)Seizures started in around 2010, fist at church, all seizures look the same, feel weak and the pass out. Most recently had at Dr. 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 dementia at baseline - wheelchair bound - barely recognize family members. h/o alcoholism. -  last was hospitalized on 11/20. At that time, he was admitted with unresponsiveness, seizure and sepsis due to urinary tract infection. The patient subsequently was discharged on Vimpat for seizures. (in past has been on dilantin)  Apparently at discharge, he was awake and doing better.   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  anxiety:   ETOH Abuse:   Chronic Kidney Disease Stage II (05/2013):   Schatzki's Ring (esophageal narrowing):   Gout:   Diabetes Mellitus,Type I (IDD):   HTN:   Hernia Repair:   Past Medical/ Surgical Hx:  Past Medical History Chronic renal insufficiency, diabetes type 2, hypertension, history of seizures, alcohol abuse in the past, dementia.   Past Surgical History None   Home Medications: Medication Instructions Last Modified Date/Time  Cipro 250 mg oral tablet 1 tab(s) orally every 12 hours for 3 days [id number] 38:46  folic acid 1 mg oral tablet 1 tab(s) orally once a day 03-Dec-14 14:59  memantine 5 mg oral tablet 1 tab(s) orally once a day for  dementia 03-Dec-14 14:59  allopurinol 100 mg oral tablet 1 tab(s) orally once a day for gout 03-Dec-14 14:59  metoprolol succinate 50 mg oral tablet, extended release 1 tab(s) orally once a day for hypertension 03-Dec-14 14:59  Cymbalta 30 mg oral delayed release capsule 1 cap(s) orally once a day for depression 03-Dec-14 14:59  acetaminophen 325 mg oral tablet 2 tab(s) orally every 4 hours, As Needed - for Fever 03-Dec-14 14:59  lacosamide 100 mg oral tablet 1 tab(s) orally 2 times a day for anticonvulsant 03-Dec-14 14:59  docusate sodium 100 mg oral capsule 1 cap(s) orally 2 times a day for constipation 03-Dec-14 14:59  pantoprazole 40 mg oral delayed release tablet 1 tab(s) orally 2 times a day 03-Dec-14 14:59  guaiFENesin 100 mg/5 mL oral liquid 10 milliliter(s) orally every 6 hours, As Needed for cough 03-Dec-14 14:59  haloperidol 1 mg oral tablet 1 tab(s) orally every 3 hours, As Needed - for Agitation 03-Dec-14 14:59  Levemir 100 units/mL subcutaneous solution 10 milliliter(s) subcutaneous once a day for diabetes mellitus 03-Dec-14 14:59  NovoLOG Mix 70/30 FlexPen 30 units-70 units/mL subcutaneous suspension  subcutaneous 4 times a day (before meals and at bedtime), As Needed per sliding scale: 151-200 = 2 units, 201-250 = 4 units, 251-300 = 6 units, 301-350 = 8 units, 351-400 = 10 units, >401 = contact MD 03-Dec-14 14:59   Allergies:  Ace Inhibitors: Swelling  Lisinopril: Swelling  Norvasc: Swelling  Social/Family History: Social History: No history of tobacco or drug abuse. According to old records he used to drink heavily, but stopped drinking after he  was admitted to [facility].  Family History: Sister had diabetes, has multiple kids and grand kids   Vital Signs: **Vital Signs.:   04-Dec-14 20:20  Vital Signs Type Routine  Temperature Temperature (F) 97.6  Celsius 36.4  Temperature Source oral  Pulse Pulse 87  Respirations Respirations 20  Systolic BP Systolic BP 137   Diastolic BP (mmHg) Diastolic BP (mmHg) 84  Mean BP 101  Pulse Ox % Pulse Ox % 100  Pulse Ox Activity Level  At rest  Oxygen Delivery 3L   Physical Exam: General: thin  HEENT: Normocephalic, atraumatic  Neck: Supple with full ROM;  Chest: Clear to auscultation bilaterally  Cardiac: Regular rate and rhythm  Extremities: ok   Neurologic Exam: Mental Status: Patient was arousable but goes back to sleep and oriented to grand daughter, president Bradley Ferris (not obama), previous - [place]. followed one step command, difficulty with 2 step commands, poor attention, concerntatrion, poor immediate and delayed recall, no neglect  Cranial Nerves: Pupils were equal round and reactive to light and accommodation;   Visual fields were full on confrontation;   Funduscopic exam was attempted  Extraocular movements were limited  Intact facial sensation  Symmetric facial movements with good lip and eye closure bilaterally;   Hearing was decreased  Tongue was midline with good palatal elevation;   neck and shoulder shrugh (limted on right)  Motor Exam: weakness of right UE with shoulder (joint related), bild LE weakness 4-/5, increased right LE tone  Deep Tendon Reflexes: decreased  Sensory Exam: decreased in UE and LE  Coordination: not reliable  Cerebellar Exam: not reliable  Gait: pt not ambulatory at baseline - couldn't sit up byhimself   Lab Results: Routine Micro:  03-Dec-14 14:50   Organism Name ESCHERICHIA COLI  Organism Quantity >100,000 CFU/ML  Micro Text Report URINE CULTURE   ORGANISM 1                >100,000 CFU/ML ESCHERICHIA COLI   COMMENT                   SENSITIVITIES TO FOLLOW   ANTIBIOTIC                       Specimen Source IN AND OUT CATH  Organism 1 >100,000 CFU/ML ESCHERICHIA COLI  Culture Comment SENSITIVITIES TO FOLLOW  Result(s) reported on 09 Sep 2013 at 10:18AM.  Routine Chem:  03-Dec-14 14:36   Magnesium, Serum  1.6 (1.8-2.4 THERAPEUTIC RANGE: 4-7  mg/dL TOXIC: > 10 mg/dL  -----------------------)  04-Dec-14 07:33   Glucose, Serum  120  BUN 16  Creatinine (comp)  1.44  Sodium, Serum 137  Potassium, Serum  5.3  Chloride, Serum 107  CO2, Serum 22  Calcium (Total), Serum 9.1  Anion Gap 8  Osmolality (calc) 276  eGFR (African American)  54  eGFR (Non-African American)  46 (eGFR values <26mL/min/1.73 m2 may be an indication of chronic kidney disease (CKD). Calculated eGFR is useful in patients with stable renal function. The eGFR calculation will not be reliable in acutely ill patients when serum creatinine is changing rapidly. It is not useful in  patients on dialysis. The eGFR calculation may not be applicable to patients at the low and high extremes of body sizes, pregnant women, and vegetarians.)  Cardiac:  03-Dec-14 14:36   Troponin I < 0.02 (0.00-0.05 0.05 ng/mL or less: NEGATIVE  Repeat testing in 3-6 hrs  if clinically indicated. >0.05 ng/mL: POTENTIAL  MYOCARDIAL INJURY. Repeat  testing in 3-6 hrs if  clinically indicated. NOTE: An increase or decrease  of 30% or more on serial  testing suggests a  clinically important change)  Routine UA:  03-Dec-14 14:50   Color (UA) Yellow  Clarity (UA) Hazy  Glucose (UA) Negative  Bilirubin (UA) Negative  Ketones (UA) Negative  Specific Gravity (UA) 1.016  Blood (UA) Negative  pH (UA) 5.0  Protein (UA) Negative  Nitrite (UA) Negative  Leukocyte Esterase (UA) 2+ (Result(s) reported on 08 Sep 2013 at 04:06PM.)  RBC (UA) 2 /HPF  WBC (UA) 79 /HPF  Bacteria (UA) 1+  Epithelial Cells (UA) NONE SEEN  WBC Clump (UA) PRESENT  Mucous (UA) PRESENT  Hyaline Cast (UA) 2 /LPF (Result(s) reported on 08 Sep 2013 at 04:06PM.)  Routine Hem:  04-Dec-14 07:33   WBC (CBC) 5.2  RBC (CBC)  3.61  Hemoglobin (CBC)  10.9  Hematocrit (CBC)  32.3  Platelet Count (CBC) 431  MCV 90  MCH 30.3  MCHC 33.8  RDW 14.3  Neutrophil % 62.7  Lymphocyte % 20.1  Monocyte % 13.3  Eosinophil  % 2.7  Basophil % 1.2  Neutrophil # 3.3  Lymphocyte # 1.0  Monocyte # 0.7  Eosinophil # 0.1  Basophil # 0.1 (Result(s) reported on 09 Sep 2013 at 07:56AM.)   Impression/Recommendations: Recommendations:   1) seizures - known epilepsy for 4-5 years, was on dilantin now on lacosamide (vimpat) (likely from lowered seizure threshold from systemic reasons)increased dose to 150 mg bid. (highest dose can be 200 mg bid but pt with flactuating renal function so will titrate very slowly) (will continue it for now but it is a brand name medication and many pt has problem with affording it in long run. it has IV form available with 1:1 dose change.I noted that levetiracetam has also been added - we can keep him on it for short period of time (few months).no need to repeat EEG (won't change management)CT head - chronic changes (atrophy and severe WM microvascular ischemic changes) Dementia - vascular + h/o alcoholism, vit B12, TSH (and other labs) ok in recent past with grand-daughter in person.will follow  Electronic Signatures: Ray Church (MD)  (Signed 04-Dec-14 21:44)  Authored: REFERRING PHYSICIAN, Primary Care Physician, Consult, History of Present Illness, Review of Systems, PAST MEDICAL/SURGICAL HISTORY, HOME MEDICATIONS, ALLERGIES, Social/Family History, NURSING VITAL SIGNS, Physical Exam-, LAB RESULTS, Recommendations   Last Updated: 04-Dec-14 21:44 by Ray Church (MD)

## 2015-01-27 NOTE — Consult Note (Signed)
Pt and family decided to go ahead with colonoscopy tomorrow afternoon.  Will do Golytely prep starting at 5am.  Electronic Signatures: Scot JunElliott, Robert T (MD)  (Signed on 07-Aug-14 16:02)  Authored  Last Updated: 07-Aug-14 16:02 by Scot JunElliott, Robert T (MD)

## 2015-01-27 NOTE — Consult Note (Signed)
Chief Complaint:  Subjective/Chief Complaint seen for diarrhea, asked to do colonoscopy by Dr Vira Agar.  Patient oriented times 3, slow responses.  denied nausea or vomiting no abdominal pain, some pain at IV site right arm.   VITAL SIGNS/ANCILLARY NOTES: **Vital Signs.:   08-Aug-14 13:28  Vital Signs Type Routine  Temperature Temperature (F) 97.7  Celsius 36.5  Temperature Source oral  Respirations Respirations 20  Systolic BP Systolic BP 465  Diastolic BP (mmHg) Diastolic BP (mmHg) 82  Mean BP 96  Pulse Ox % Pulse Ox % 95  Pulse Ox Activity Level  At rest  Oxygen Delivery Room Air/ 21 %  *Intake and Output.:   08-Aug-14 08:06  Stool  pt. had a large amount of very loose brown stool    09:10  Stool  pt. had a large amount of brown liqiudy stool    10:06  Stool  pt. had a meduim amount of brown diahrea    11:10  Stool  pt. had a large amount of diahrea    12:06  Stool  pt. had a large amount of clear bm    15:06  Stool  pt. had a large amount of clear bm   Brief Assessment:  Cardiac Regular   Respiratory clear BS   Gastrointestinal details normal Soft  Nontender  Nondistended  No masses palpable  Bowel sounds normal   Lab Results:  Routine Chem:  07-Aug-14 04:59   Glucose, Serum  113  BUN 7  Creatinine (comp) 1.18  Sodium, Serum 140  Potassium, Serum 4.0  Chloride, Serum  110  CO2, Serum 23  Calcium (Total), Serum 8.9  Anion Gap 7  Osmolality (calc) 278  eGFR (African American) >60  eGFR (Non-African American)  59 (eGFR values <56m/min/1.73 m2 may be an indication of chronic kidney disease (CKD). Calculated eGFR is useful in patients with stable renal function. The eGFR calculation will not be reliable in acutely ill patients when serum creatinine is changing rapidly. It is not useful in  patients on dialysis. The eGFR calculation may not be applicable to patients at the low and high extremes of body sizes, pregnant women, and vegetarians.)  Routine Hem:   07-Aug-14 04:59   WBC (CBC)  2.9  RBC (CBC)  3.87  Hemoglobin (CBC) 13.2  Hematocrit (CBC)  38.3  Platelet Count (CBC) 183  MCV 99  MCH  34.1  MCHC 34.4  RDW 14.4  Neutrophil % 45.4  Lymphocyte % 36.3  Monocyte % 11.4  Eosinophil % 6.0  Basophil % 0.9  Neutrophil #  1.3  Lymphocyte # 1.1  Monocyte # 0.3  Eosinophil # 0.2  Basophil # 0.0 (Result(s) reported on 13 May 2013 at 05:30AM.)   Radiology Results: XRay:    03-Aug-14 10:28, Abdomen 3 Way Includes PA Chest  Abdomen 3 Way Includes PA Chest   REASON FOR EXAM:    Pain  COMMENTS:   May transport without cardiac monitor    PROCEDURE: DXR - DXR ABDOMEN 3-WAY (INCL PA CXR)  - May 09 2013 10:28AM     RESULT:     Findings:  Frontal view of the chest demonstrates no evidence of focal   infiltrates, effusions or edema.     Air is seen within nondilated loops of large and small bowel. The   visualized bony skeleton is unremarkable. There is a paucity of bowel gas.    IMPRESSION:    1.  Nonspecific, nonobstructive bowel gas pattern.  2. Chest radiograph  without evidence of acute cardiopulmonary disease.      Thank you for this opportunity to contribute to the care of your patient.         Verified By: Mikki Santee, M.D., MD    05-Aug-14 17:37, Chest PA and Lateral  Chest PA and Lateral   REASON FOR EXAM:    bibasilar crackles left>right.  COMMENTS:       PROCEDURE: DXR - DXR CHEST PA (OR AP) AND LATERAL  - May 11 2013  5:37PM     RESULT: The lungs are reasonably well inflated and clear. There are   coarse lung markings projecting over the lower thoracic spine on the   lateral film which are not clearly evident on the frontal film. The   cardiac silhouette is normal in size. The mediastinum is normal in width.    IMPRESSION:  There are findings which may reflect subsegmental   atelectasis or early infiltrate in the lower lobes especially on the   left. Followup films following therapy are recommended to  assure   clearing.   Dictation Site: 5        Verified By: DAVID A. Martinique, M.D., MD    06-Aug-14 12:55, Chest PA and Lateral  Chest PA and Lateral   REASON FOR EXAM:    sob  COMMENTS:       PROCEDURE: DXR - DXR CHEST PA (OR AP) AND LATERAL  - May 12 2013 12:55PM     RESULT: Comparison: None    Findings:     PA and lateral chest radiographs are provided.  There is no focal   parenchymal opacity, pleural effusion, or pneumothorax. The heart and   mediastinum are unremarkable.  The osseous structures are unremarkable.    IMPRESSION:   No acute disease of the chest.    Dictation Site: 1        Verified By: Jennette Banker, M.D., MD  CT:    03-Aug-14 15:45, CT Abdomen and Pelvis Without Contrast  CT Abdomen and Pelvis Without Contrast   REASON FOR EXAM:    (1) diarrhea; (2) diarrhea;    NOTE: Nursing to Give   Oral CT Contrast  COMMENTS:       PROCEDURE: CT  - CT ABDOMEN AND PELVIS W0  - May 09 2013  3:45PM     RESULT: CT abdomen pelvis dated 05/09/2013    Technique: Helical non IV contrasted 3 mm sections were obtained from the   lung bases through the pubic symphysis, patient received oral contrast.    Findings: Lung bases are unremarkable.    Noncontrast evaluation of the liver, spleen, adrenals, left kidney are   unremarkable.A rounded focus identified within the posterior midpole     region right kidney likely representing a cyst. On image number 44 soft   tissue series there findings consistent with a small duodenum   diverticulum versus a contained ulcer. There is no CT evidence of bowel   obstruction, enteritis, colitis, diverticulitis nor appendicitis. The   appendix is identified and is unremarkable.    There findings consistent with a gastric diverticulum. A small hiatal   hernia is identified . There is no evidence of abdominal aortic aneurysm.    IMPRESSION:  No CT evidence of obstructive or inflammatory abnormalities.  2. Incidental findings as  described above.        Verified By: Mikki Santee, M.D., MD   Assessment/Plan:  Assessment/Plan:  Assessment 1) diarrhea-ude,  colonoscopy today.  I have discussed wht risks benefits and complicatons of colonoscopy to include not limited to bleeding infection and sedation and he wishes to proceed.  consent also obtained by staff from patients daughter.  2) dysphagia-s/p esophageal dilation yesterday, doing well. 3) multiple medical issues-ckd, h/o etoh abuse, DM, seizure d/o   Plan 1) as above further recs to follow.   Electronic Signatures: Loistine Simas (MD)  (Signed 08-Aug-14 16:37)  Authored: Chief Complaint, VITAL SIGNS/ANCILLARY NOTES, Brief Assessment, Lab Results, Radiology Results, Assessment/Plan   Last Updated: 08-Aug-14 16:37 by Loistine Simas (MD)

## 2015-01-27 NOTE — Discharge Summary (Signed)
PATIENT NAME:  Phillip LandryMITCHELL, Brekken R MR#:  [id number] DATE OF BIRTH:  1931/12/23  DATE OF ADMISSION:  05/09/2013 DATE OF DISCHARGE:  05/15/2013  PRIMARY CARE PHYSICIAN: Was Dr. Leavy CellaBlocker. He will have to follow up with a new primary care physician. I did give the care manager information to possibly set up with Dr. Beverely RisenFozia Khan.   FINAL DIAGNOSES: 1.  Diarrhea.  2.  Schatzki's ring, which was dilated.  3.  Hyperkalemia.  4.  Hypomagnesemia.  5.  Alcohol abuse.  6.  Diabetes.  7.  Chronic kidney disease.   MEDICATIONS ON DISCHARGE: Include Keppra 500 mg twice a day, metoprolol extended-release 50 mg daily, allopurinol 100 mg daily, acetaminophen/tramadol 325/37.5 mg 1 tablet every 6 to 8 hours, aspirin 81 mg daily, NovoLog Mix 70/30 FlexPen 24 units subcutaneous injection in the daytime, sildenafil 50 mg as needed, Protonix 40 mg daily.   HOME HEALTH: With physical therapy and nurse.   DIET: Low-sodium, carbohydrate-controlled diet, mechanical soft diet with pureed meats.  FOLLOWUP: With Dr. Mechele CollinElliott, gastroenterology, in 3 weeks.   HOSPITAL COURSE: The patient was admitted 05/09/2013 and discharged 05/15/2013. The patient came in with diarrhea. He had diarrhea for about 2 weeks, some weight loss, also found to have hyperkalemia and was given 1 dose of Kayexalate.   LABORATORY AND RADIOLOGICAL DATA: During the start of the hospital course included an EKG that showed normal sinus rhythm, left axis deviation. Alcohol level 68. Glucose 123, BUN 35, creatinine 1.54, sodium 131, potassium 5.7, chloride 104, CO2 of 16, calcium 9.2. Liver function tests: Total protein elevated at 9.0. White blood cell count 5.0, hemoglobin and hematocrit 15.4 and 44.3, platelet count of 238. Lipase 133. Abdominal x-ray: Nonobstructive bowel gas pattern. Chest x-ray: Negative. Stool culture: No Salmonella, shigella, E. coli or Campylobacter. Venous pH 7.22. Lactic acid 5.0. CT scan of the abdomen and pelvis showed negative  for obstructive or inflammatory abnormalities. Urine culture: Mixed contamination. Urinalysis: Trace ketones. Hemoglobin A1c 8.0. Magnesium 1.3. Stool was negative for C. difficile. Stool for ova and parasites was negative, no white blood cells in the stool. Hemoglobin upon discharge 13.2. Potassium 4.0, creatinine 1.18.   PROCEDURES DURING THE HOSPITAL COURSE: Included an upper endoscopy on 05/13/2013 by Dr. Mechele CollinElliott that showed a mild Schatzki's ring, dilation performed. Nonbleeding erosion of the gastric antrum. Colonoscopy on August 8 performed by Dr. Marva PandaSkulskie showed a 3 mm polyp in the distal descending colon, resected and retrieved. One polyp in the distal ascending colon, resected and retrieved.   HOSPITAL COURSE PER PROBLEM LIST:  1.  For the patient's diarrhea, this had resolved. Could be secondary to the magnesium supplements that he was taking or alcohol or poor diet.  2.  For the patient Schatzki's ring, this was dilated. The patient is on a PPI.  3.  Hyperkalemia, may be secondary to the creatinine that was elevated, acute kidney injury. Better after a dose of Kayexalate and hydration.  4.  Hypomagnesemia, likely secondary to alcohol abuse. This was replaced during the hospital course.  5.  Alcohol abuse. The patient was put on CIWA protocol. The patient at the time of discharge did not have any signs of withdrawal. The patient was discharged home.  6.  For his diabetes, he was put on his usual medication upon discharge.  7.  For his chronic kidney disease, GFR upon discharge is greater than 60, making this chronic kidney disease stage II. His creatinine had improved with hydration during the hospital course.  TIME SPENT ON DISCHARGE: 35 minutes.   ____________________________ Herschell Dimes. Renae Gloss, MD [id number] D: 05/15/2013 14:17:46 ET T: 05/15/2013 15:43:54 ET JOB#: [id number]  cc: Herschell Dimes. Renae Gloss, MD, <Dictator> Scot Jun, MD Salley Scarlet MD ELECTRONICALLY SIGNED  05/21/2013 16:45

## 2015-01-27 NOTE — Consult Note (Signed)
PATIENT NAME:  Phillip Osborne, Phillip Osborne MR#:  [id number] DATE OF BIRTH:  1931/12/12  DATE OF CONSULTATION:  05/26/2013  CONSULTING PHYSICIAN:  Gurnoor Ursua K. Sherryll BurgerShah, MD  REFERRING PHYSICIAN:  Dr. Elisabeth PigeonVachhani   REASON FOR CONSULTATION: Altered mental status, concern for dementia versus parkinsonism.   HISTORY OF PRESENT ILLNESS:  Mr. Phillip Osborne is a 81 year old African-American gentleman with a previous history of alcoholism. Was admitted on 05/22/2013.    Most of the details obtained from the chart review, as when I start talking to the family members, they mentioned that they have offered all the history in the past, and would be hesitant to talk about the same.   The patient was hospitalized after having a significant argument with a family member on a card game, and wanting alcohol brought to the ER, and observed in the ER for a few days due to unavailability of mental health bed.    Lately, patient's level of alertness has gone downhill, likely from his hypoxia from possible aspiration pneumonia.   The patient does have a previous history of alcohol-related seizures. They could not tell me exactly how long he had seizures in the past. Could not describe the seizure semiology, but said if he does not drink alcohol and does not take his medications, he will have a seizure.   The patient also has chronic kidney disease stage III.   The patient had a prolonged hospitalization before this one, and family mentioned that he has been going downhill for at least the last 3 to 4 months, that he is speaking much less often. He is moving much less, and mostly staying bedridden.   He also has very poor nutrition and p.o. intake due to his severe alcoholism for many years, but it has been worse lately.   Neurology was consulted due to worsening altered mental status and concern for parkinsonism due to stiffness on his exam at one point.  PAST MEDICAL HISTORY: Significant for alcoholism, alcohol-related seizures,  hypertension, diabetes, chronic kidney disease stage III, alcohol withdrawal, related  DTs.   PAST SURGICAL HISTORY:  Negative.   SOCIAL HISTORY:  Significant that he lives by himself. Occasionally stays with a girlfriend. He drinks very heavily.   FAMILY HISTORY:  Significant for diabetes.   MEDICATIONS:  I reviewed his home medication list, which does include Keppra 500 mg twice a day.   REVIEW OF SYSTEMS:  Not obtainable due to his decreased level of alertness and difficulty talking.   PHYSICAL EXAMINATION: VITAL SIGNS:  His temperature was 96, pulse 85, respiratory rate 28, blood pressure 110/77, pulse ox 94%.  GENERAL: The patient is an elderly-looking, African-American gentleman lying in bed surrounded by family members. He does have a decreased level of alertness.  LUNGS:  Clear to auscultation, except in his left lower lobe, he does have some crepitations.  HEART:  S1, S2 heart sounds.  NECK:  His carotid exam did not reveal any bruit.  NEUROLOGIC:  On his neurologic exam, he was quite drowsy, but arousable. He stays up for around 10 to 20 seconds and then falls back to sleep. He had a hard time understanding me, but he was able to understand family members, and was able to follow one-step commands, but had difficulty following two-step commands.   The patient is practically mute. It is very hard to understand him, that when he speaks, mostly he mumbles, but to the family, they were able to understand him. He could not tell me the day, date,  month or year, or current president.   His attention and concentration seem to be impaired. It was difficult to know whether he has a language deficit or more muteness or generalized encephalopathy. I do not think he has any neurological neglect.   On his cranial nerves, his pupils were equal, round and reactive. Extraocular movements were slow. He had a hard time tracking me. I cannot check his visual fields reliably. His face seems to be  symmetric. His tongue was midline. Facial sensation seems to be intact. His hearing seems to be okay, but is not reliable.   On his motor exam, I did not notice any significant increase in tone. He did lift his arms and legs up, but individual strength testing of individual muscles was difficult. His sensation seems to be intact to light touch. His reflexes were symmetric. I did not see any myoclonic jerk.   His coordination testing was not reliable. I was not able to check his gait because of his prolonged deconditioning.   ASSESSMENT AND PLAN: 1.  Altered mental status, likely toxic metabolic encephalopathy in a patient with hypoxia, with long-standing chronic alcoholism.   I talked to the patient and family about Wernicke's encephalopathy and patients who can have a gait impairment, eye movement abnormality, and cognitive impairment.   The patient was just started on thiamine on August 19.   I will add a multivitamin with minerals.   I added a few labs, such as vitamin B12 and HIV.   His TSH was okay. I would also add ammonia.   I do not think he has classical parkinsonism.   I cannot explain his transient increase in tone that was noticed by the provider, but I do not feel a significant increase in his tone.   Antipsychotic medication use can increase tone sometimes.   Some patients with delirium can have paratonia, which can be perceived as increased tone.   2.  Seizure disorder. It is difficult to know whether he has a primary seizure disorder or from alcohol withdrawal seizures.   He has not a clinical seizure in "many years", per family. I think doing an EEG will be low yield so will hold off on that for now.   I agree with Dr. Cherlynn Kaiser that we should repeat a CT scan of the head if patient continues to have decreased level of alertness. I reviewed the CT scan of the head with the family that he does have some subcortical white matter microvascular ischemic changes, likely from  his vascular risk factors.   The patient cannot have MRI as he seemed to have metallic artifact in his left parietooccipital region as well as  another fragment close to his foramen magnum near the base of the skull on the left side.   The patient should get PT and OT, at least range of motion to prevent further deconditioning and potential for cortical illness polyneuropathy/myopathy.   I also advised family about alcoholic myopathy and neuropathy, which can cause significant impairment in gait when he was having difficulty with walking and frequent falls before he came to the hospital since last 3 of 4 months.   I also explained to them that patients with underlying neuropathy can get significantly worse during their hospitalization, with a short period of deconditioning.   It was my pleasure to see this patient. Feel free to contact me with any further questions. I will see this patient on a [name] basis.     ____________________________ Clydia Llano  Marylou Mccoy, MD hks:mr D: 05/26/2013 18:10:15 ET T: 05/26/2013 18:59:54 ET JOB#: [id number]  cc: Zackry Deines K. Sherryll Burger, MD, <Dictator> Durene Cal [facility] MD ELECTRONICALLY SIGNED 06/01/2013 16:29

## 2015-01-27 NOTE — Consult Note (Signed)
CC: dysphagia.  Pt had CXR yesterday with a little questionable infiltrate and today is better.  Exam sounds OK.  Plan EGD tomorrow early afternoon, he is in agreement.  Electronic Signatures: Scot JunElliott, Robert T (MD)  (Signed on 06-Aug-14 17:37)  Authored  Last Updated: 06-Aug-14 17:37 by Scot JunElliott, Robert T (MD)

## 2015-01-27 NOTE — H&P (Signed)
PATIENT NAME:  Phillip Osborne, Phillip Osborne MR#:  [id number] DATE OF BIRTH:  1932/04/06  DATE OF ADMISSION:  09/08/2013  PRIMARY CARE PROVIDER: Dr. Karrie DoffingKylluru.   REFERRING PHYSICIAN:  Dr. Loleta Roseory Forbach.   CHIEF COMPLAINT: Unresponsiveness.   HISTORY OF PRESENT ILLNESS: The patient is a 81 year old African American male with past medical history of chronic renal insufficiency, dementia, diabetes, who currently resides at [facility] who has had recurrent admissions to the hospital, who last was hospitalized on 11/20. At that time, he was admitted with unresponsiveness, seizure and sepsis due to urinary tract infection. The patient subsequently was discharged on Vimpat for seizures. Apparently at discharge, he was awake and doing better. The patient was brought to the ED again with a seizure on the 1st and was subsequently discharged back to the facility. This time, he again became unresponsive and had a seizure episode and was brought to the ED. He is currently unresponsive on a full face mask of O2. His initial blood pressure was noted to 59/40. Currently he is unresponsive. He is a DO NOT RESUSCITATE. He is unable to give me any further history.   PAST MEDICAL HISTORY: Chronic renal insufficiency, diabetes type 2, hypertension, history of seizures, alcohol abuse in the past, dementia.   PAST SURGICAL HISTORY: None.   ALLERGIES: ACE INHIBITOR, LISINOPRIL AND NORVASC.   FAMILY HISTORY: Sister had diabetes.   SOCIAL HISTORY: No history of tobacco or drug abuse. According to old records he used to drink heavily, but stopped drinking after he was admitted to [facility].   HOME MEDICATIONS: He is on acetaminophen 650 q. 4 [name] for fever, allopurinol 100 daily, Cipro 250, one tab p.o. q. 12, Cymbalta 30 one tab p.o. daily, Colace 100 mg 1 tab p.o. b.i.d., folic acid 1 mg daily, Guifenesin 10 mL q. 6 [name] for cough, haloperidol 1 mg q. 3 [name] for agitation, lacosamide 100 mg 1 tab p.o. b.i.d., Levemir 10  units subcutaneous for diabetes daily, (Dictation Anomaly) 5 mg 1 tab p.o. daily, metoprolol succinate 50 daily. He is on a NovoLog Mix 70/30 sliding scale, Protonix 40 mg 1 tab p.o. b.i.d..  REVIEW OF SYSTEMS: Unobtainable due to the patient being poorly responsive.   PHYSICAL EXAMINATION: VITAL SIGNS: Temperature 96.7, pulse 75, respirations 18, blood pressure 59/40, O2 98%.  GENERAL: The patient is critically ill-appearing, thin African American male who is not responding to my stimuli.  HEENT: Normocephalic, atraumatic. Pupils equally round, react to light and accommodation. No scleral icterus. No conjunctival injection. Unable to examine his oropharynx due to patient not opening his mouth. External nasal exam shows no drainage or ulceration. Ear exam shows no drainage or erythema.  NECK: Supple without any JVD.  CARDIOVASCULAR: Regular rate and rhythm. No murmurs, rubs, clicks. PMI is not displaced.  LUNGS: Clear to auscultation bilaterally without any rales, rhonchi, wheezing.  ABDOMEN: Soft, nontender, nondistended. Positive bowel sounds x 4. No hepatosplenomegaly.  EXTREMITIES: No clubbing, cyanosis or edema.  SKIN: No rash.  LYMPHATICS: No lymph nodes palpable.  VASCULAR: Good DP, PT pulses. NEUROLOGIC: The patient is poorly responsive. He does not withdraw to noxious stimuli. Unable to do neuro exam.  PSYCHIATRIC: The patient is poorly responsive.   LABORATORY AND IMAGING DATA: Magnesium 1.6. Troponin less than 0.02. WBC 5.8, hemoglobin 9.8, platelet count 5/30. BMP is pending. The patient's urinalysis done on 12/01 showed leukocytes 1+, WBCs were 14. 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   ASSESSMENT AND PLAN: The patient is a 81 year old African American male with recurrent admissions for decrease in responsiveness, seizure.  Brought in with similar complaints.  1.  Acute encephalopathy possibly due to sepsis with hypotension, possibly also related to postictal state. At this time, we will give him supportive care, treat with antibiotics until we have cultures back. Monitor his mental status.  2.  Recurrent seizure. I am going to start him on IV Keppra.  We will continue Vimpat if he is able to take orally. I will also ask neurology to re-evaluate.   3.  Diabetes. We will place him on sliding scale insulin for the time being, until taking p.o. 4.  Hypotension. We will give him aggressive intravenous fluids. Monitor his blood pressure.  5.  Chronic renal insufficiency. We will give him IV fluids, follow his renal function, avoid any nephrotoxins.   CODE STATUS: DO NOT RESUSCITATE.  PROGNOSIS:  Very poor.  I am going to ask palliative care team to evaluate the patient and determine the goal of therapy.     NOTE: Time spent 55 minutes on this patient.  This is a critical care patient.  The patient is at very high risk of cardiopulmonary arrest.     ____________________________ Lacie Scotts. Allena Katz, MD shp:dp D: 09/08/2013 15:35:00 ET T: 09/08/2013 16:17:12 ET JOB#: [id number]  cc: Lyda Colcord H. Allena Katz, MD, <Dictator> Charise Carwin MD ELECTRONICALLY SIGNED 09/17/2013 12:46

## 2015-01-27 NOTE — Consult Note (Signed)
Brief Consult Note: Diagnosis: Alcohol Dependence.   Patient was seen by consultant.   Orders entered.   Comments: Pt seen in the medical floor. he appeared more calm and alert and his family was present at his bed side. He was able to answer most of the questions appropriately and was able to recognize me from the previous day. his daughter stated that he is improving well and taking his meds as prescribed. No Si/HI or plans noted.   Plan; Case discussed with his treating physician.  He will be released from the IVC as he does not meet the criteria.Papers signed and he will sign for voluntary admission.  Pt will continue his medical care in the hosiptal.  Thank you for allowing me to participate in care of this pt.  Electronic Signatures: Rhunette CroftFaheem, Tyrez Berrios S (MD)  (Signed 21-Aug-14 16:19)  Authored: Brief Consult Note   Last Updated: 21-Aug-14 16:19 by Rhunette CroftFaheem, Khalee Mazo S (MD)

## 2015-01-27 NOTE — Discharge Summary (Signed)
PATIENT NAME:  Phillip Osborne, Phillip Osborne MR#:  [id number] DATE OF BIRTH:  [birth year]  DATE OF ADMISSION:  09/08/2013 DATE OF DISCHARGE:  09/10/2013   DISCHARGE DIAGNOSES:  1.  Altered mental status due to infection.  2.  Extended-spectrum beta-lactamase urinary tract infection, need IV antibiotic for 14 days, PICC line.  3.  Acute-on-chronic renal failure.  4.  Dysphagia.  5.  Seizure.   CONDITION ON DISCHARGE: Stable.   CODE STATUS ON DISCHARGE: DO NOT RESUSCITATE.   DISCHARGE MEDICATIONS:  1.  Folic acid 1 mg oral tablet once a day.  2.  Allopurinol 100 mg oral once a day.  3.  Cymbalta 30 mg once a day.  4.  Memantine 5 mg oral tablet once a day for dementia.  5.  Acetaminophen 325 mg oral 2 tablets every four hours as needed for fever.  6.  Docusate 100 mg oral capsule 2 times a day.  7.  Pantoprazole 40 mg delayed-release 2 times a day.  8.  Guaifenesin 10 mL oral every 6 hours as needed for cough.  9.  Haldol 1 mg oral every 3 hours as needed for agitation.  10.  NovoLog 70/30 mixed insulin subcutaneous 4 times a day, sliding scale coverage.  11.  Lacosamide 150 mg every 12 hours.  12.  Meropenem 500 mg every 12 hours for 14 days.  13.  Keppra 250 mg oral tablet 2 times a day.   DIET ON DISCHARGE: Regular diet. Consistency: Honey-thick liquids.   FOLLOWUP: Advised to follow kidney function within 2 to 4 weeks with primary care physician and neurologist's office in 1 to 2 months.   HISTORY OF PRESENT ILLNESS: A 81 year old African American male with past medical history of chronic renal insufficiency, dementia, diabetes, was residing at [facility] and admission to hospital. Last admission was on November 20th for unresponsiveness and seizure and sepsis with UTI, subsequently discharged on Vimpat for seizure. Came back to the Emergency Room for having unresponsiveness and seizure.   On arrival, the patient was found lethargic and possibly it was postictal or sepsis. Cultures were  collected and he was started on broad-spectrum antibiotic as UA was positive. He was started on Zosyn for seizures and started on IV Keppra. Later on in the hospital course, the patient became more alert and came back to his baseline which was dementia. He was found having UTI with ESBL, E. coli and so he was started on meropenem and discharged to the nursing home to finish 14 days of IV therapy.   For his seizure disorder, neurology consult was called in as he was taking Vimpat 100 mg b.i.d. We increased it to 150 mg b.i.d. and also added Keppra to it and advised to follow in neurology clinic in 2 months.   OTHER MEDICAL ISSUES:  1.  Diabetes. His blood sugar level remained under control and he was maintained on sliding scale coverage.  2.  Hypotension. This was present on admission, possibly due to sepsis, but with IV fluid and treatment of his urinary tract infection, blood pressure came under control and remained stable.  3.  Chronic renal insufficiency, acute worsening. With IV fluids, creatinine was stable around 1.5 and we did the nephrology consult for further follow-up and advice to follow in nephrology clinic in 1 month for further advice.   CONSULT IN THE HOSPITAL: Dr. Cristopher PeruHemang Shah for neurology. Dr. Mosetta PigeonHarmeet Singh for nephrology.   IMPORTANT LABORATORY RESULTS IN THE HOSPITAL: On presentation on the 3rd of  December, hemoglobin was 9.8. White cell count was 5.8. Troponin was less than 0.02. Blood culture remained negative in 48 hours. Urinalysis was positive with 79 WBCs and 2+ leukocyte esterase and urine culture grew more than 100,000 CFU E. coli which were ESBL positive. Hemoglobin and white cell count remained stable on next-day followup. Creatinine was 1.44 which went up to 1.51 on followup. Blood sugar remained stable, between 100 to 180.   TOTAL TIME SPENT ON THIS DISCHARGE: 45 minutes.   ____________________________ Hope Pigeon Elisabeth Pigeon, MD vgv:np D: 09/10/2013 16:52:05  ET T: 09/10/2013 17:14:14 ET JOB#: [id number]  cc: Hope Pigeon. Elisabeth Pigeon, MD, <Dictator> Hemang K. Sherryll Burger, MD Laverda Sorenson, MD Heath Gold [facility] MD ELECTRONICALLY SIGNED 09/14/2013 9:22

## 2015-01-27 NOTE — Consult Note (Signed)
Chief Complaint:  Subjective/Chief Complaint Patient reports that he is hungry. He reports about 3 bowel movements-"getting better" today. Stools a little more firm, per patient. No blood noted. No abd pain. No CP/SOB. Swallowing clear liquids well without n/v.   VITAL SIGNS/ANCILLARY NOTES: **Vital Signs.:   05-Aug-14 13:59  Vital Signs Type Routine  Temperature Temperature (F) 97.3  Celsius 36.2  Temperature Source oral  Pulse Pulse 88  Respirations Respirations 20  Systolic BP Systolic BP 633  Diastolic BP (mmHg) Diastolic BP (mmHg) 84  Mean BP 101  Pulse Ox % Pulse Ox % 98  Pulse Ox Activity Level  At rest  Oxygen Delivery Room Air/ 21 %   Brief Assessment:  GEN no acute distress, disheveled   Cardiac Regular   Respiratory crackles  bilat with left>right lung base   Gastrointestinal details normal Soft  Nontender   EXTR negative edema   Lab Results:  Routine Chem:  04-Aug-14 04:15   Glucose, Serum 85  BUN  23  Creatinine (comp)  1.44  Sodium, Serum 140  Potassium, Serum  5.3  Chloride, Serum  109  CO2, Serum 24  Calcium (Total), Serum 9.3  Anion Gap 7  Osmolality (calc) 282  eGFR (African American)  54  eGFR (Non-African American)  46 (eGFR values <58mL/min/1.73 m2 may be an indication of chronic kidney disease (CKD). Calculated eGFR is useful in patients with stable renal function. The eGFR calculation will not be reliable in acutely ill patients when serum creatinine is changing rapidly. It is not useful in  patients on dialysis. The eGFR calculation may not be applicable to patients at the low and high extremes of body sizes, pregnant women, and vegetarians.)  Magnesium, Serum  1.3 (1.8-2.4 THERAPEUTIC RANGE: 4-7 mg/dL TOXIC: > 10 mg/dL  -----------------------)  Hemoglobin A1c ([facility])  8.0 (The American Diabetes Association recommends that a primary goal of therapy should be <7% and that physicians should reevaluate the treatment regimen in patients  with HbA1c values consistently >8%.)  Cholesterol, Serum 121  Triglycerides, Serum 121  HDL (INHOUSE) 60  VLDL Cholesterol Calculated 24  LDL Cholesterol Calculated 37 (Result(s) reported on 10 May 2013 at 05:11AM.)  Routine Hem:  04-Aug-14 04:15   WBC (CBC) 5.1  RBC (CBC)  4.08  Hemoglobin (CBC) 13.8  Hematocrit (CBC) 40.3  Platelet Count (CBC) 219  MCV 99  MCH 33.9  MCHC 34.2  RDW  14.8  Neutrophil % 68.3  Lymphocyte % 18.1  Monocyte % 12.5  Eosinophil % 0.8  Basophil % 0.3  Neutrophil # 3.5  Lymphocyte #  0.9  Monocyte # 0.6  Eosinophil # 0.0  Basophil # 0.0 (Result(s) reported on 10 May 2013 at 05:11AM.)   Assessment/Plan:  Assessment/Plan:  Assessment 1.Acute diarrhea with negative CT for colitis. Stool studies neg for c diff and  wbc. Diarrhea has improved. Diarrhea can be secondary to alcoholism/ or gastritis. No abd pain/n/v. Tolerating clear diet very well and requests more food. CT found a possible abnormality in duodenum. Pt had reported dysphagia and weight loss.  2. Crackles bilat lower lobes-likely from inactivity. O2 is good-no cough, f/chills/SOB.  3. Alcoholism-no overt DT signs 4. Renal patient-labs stable   Plan PA and LAT CXR today.  Adv diet to full liquid Dr. Vira Agar is considering an EGD tomorrow.   Electronic Signatures: Gershon Mussel (NP)  (Signed 05-Aug-14 15:46)  Authored: Chief Complaint, VITAL SIGNS/ANCILLARY NOTES, Brief Assessment, Lab Results, Assessment/Plan   Last Updated: 05-Aug-14 15:46 by  Gershon Mussel (NP)

## 2015-01-27 NOTE — Consult Note (Signed)
Brief Consult Note: Diagnosis: Alcohol Dependence, AMS.   Patient was seen by consultant.   Recommend further assessment or treatment.   Orders entered.   Comments: Pt seen in the medical floor. He was admitted due to agitation and AMS. He has long h/o alcohol use and was evaluated by the neurologist at the previous visit and the psychiatry service as well. however, he was unable to particpiate today as he was given the combination of Haldol and Ativan since this am due to pulling on the tubes and becoming agitated. He was sleepy and did not talk. His chart was reviewed.  He has responded well to Risperdal in the past. Will adjust meds as follows.   Plan: I will restart him on Risperdal 0.25mg  po bid and will add Namenda 5mg  for his memory issues. He will be monitored closely by the staff. Will be continued on Haldol 1 mg po q4-6 hrs prn for agitation.   He does not have any SI/HI at this time.  Will continue with medical care of this pt.  Thank you for allowing me to participate in care of this pt.  Electronic Signatures: Rhunette CroftFaheem, Jeannene Tschetter S (MD)  (Signed 21-Oct-14 14:23)  Authored: Brief Consult Note   Last Updated: 21-Oct-14 14:23 by Rhunette CroftFaheem, Tayna Smethurst S (MD)

## 2015-01-27 NOTE — Discharge Summary (Signed)
PATIENT NAME:  Phillip Osborne, Phillip Osborne MR#:  [id number] DATE OF BIRTH:  Mar 18, 1932  DATE OF ADMISSION:  05/25/2013 DATE OF DISCHARGE:  06/04/2013  PRIMARY CARE PHYSICIAN: Orson AloeMichael Blocker, MD  ADDENDUM: For detailed discharge summary, please refer to the interim discharge summary dictated by Dr. Sherryll BurgerShah yesterday.   DISCHARGE DIAGNOSES: 1.  Acute hypoxic respiratory failure.  2.  Pneumonia.  3.  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  4.  Alcohol withdrawal seizure.  5.  Hypertension.  6.  Acute renal failure and chronic kidney disease.   CODE STATUS: FULL CODE.   CONDITION: Stable.   HOME MEDICATIONS: [facility] physician discharge instruction medication reconciliation list.  DIET: Low sodium, low fat, low cholesterol, ADA.  ACTIVITY: As tolerated.   FOLLOW-UP CARE: Follow with PCP within 1 week.  HOSPITAL COURSE: Please refer to the interim discharge summary dictated by Dr. Sherryll BurgerShah yesterday. The patient had agitation yesterday and last night, but has much improved. Today the patient has no agitation so far. Dr. Katrinka BlazingSmith suggested discontinuing Haldol, continue increase of Seroquel to 50 mg at bedtime. In addition, the patient is placed on Risperdal [name] Since the patient's symptoms have much improved, vital signs are stable, physical examination is unremarkable, the patient will be discharged to a nursing home today. I discussed the patient's discharge plan with the case manager and nurse.  TIME SPENT: About 36 minutes.  ____________________________ Phillip PollackQing Arrington Bencomo, MD qc:sb D: 06/04/2013 13:19:50 ET T: 06/04/2013 14:40:13 ET JOB#: [id number]  cc: Phillip PollackQing Shamond Skelton, MD, <Dictator> Phillip PollackQING Destiny Trickey MD ELECTRONICALLY SIGNED 06/04/2013 15:07

## 2015-01-27 NOTE — Discharge Summary (Signed)
PATIENT NAME:  Phillip Osborne, Phillip Osborne MR#:  [id number] DATE OF BIRTH:  [birth year]  DATE OF ADMISSION:  07/26/2013 DATE OF DISCHARGE:  07/28/2013  DISCHARGE DIAGNOSES: 1.  Acute renal failure with chronic kidney disease stage III, baseline creatinine is 1.5.  2.  Hyperkalemia.   3.  History of diabetes type 2.  4.  Hypertension.  5.  History of seizure disorder.  6.  History of alcohol abuse.  7.  Dementia.  8.  Gout.  9.  Fall secondary to dementia and acute renal failure.  10.  Urinary tract infection.   DISCHARGE MEDICATIONS: 1.  Ativan 1 mg at bedtime and 1 mg q.6 hours as needed.  2.  Keppra 500 mg q.12 hours.  3.  Cymbalta 30 mg p.o. daily.  4.  Insulin with sliding scale coverage.  5.  Allopurinol 100 mg daily.  6.  Aspirin 81 mg daily.  7.  Haldol 1 mg every 3 to 4 hours as needed for agitation p.o.  8.  Risperdal 0.25 mg at 9:00 a.m. and 5:00 p.m.  9.  Metoprolol succinate 50 mg p.o. daily.  10.  Discontinue HCTZ.  11.  Memantine; that is Namenda, 5 mg p.o. daily.  12.  Pantoprazole 40 mg p.o. daily.  13.  Cipro 250 mg p.o. q.12 hours for 7 days.  14.  Folic acid 1 mg p.o. daily.  15.  The patient was taking insulin 70/30 but his p.o. intake is not predictable, so I stopped that. The patient will continue sliding scale with coverage. For blood glucose less than 150, no coverage; 151 to 200, 2 units; 201 to 250, 4 units; 251 to 300, 6 units; 301 to 350, 8 units; 351 to 400, 10 units; more than 400, call M.D.   DIET: Pureed diet with thin liquids. Regular diet is started because the patient is not eating much, so I changed that to a regular diet. Consistency will be pureed consistency.    ACTIVITY: As per physical recommendations.   CONSULTATIONS:  1.  Nephrology consult with Dr. Thedore Mins.  2.  Psychiatric consult with Dr. Brandy Hale.     HOSPITAL COURSE: The patient is a 81 year old demented male with history of alcohol abuse, seizure disorder, brought in from [facility] rehab because of the fall. The patient has dementia but unable to give history if he had any chest pain or headache.  1.  The patient was brought into the ER because of the fall and had a laceration on his head, which was repaired. Had update tetanus injection in the ER. The patient had acute cystitis on October 17th, started on p.o. Cipro at the nursing home. Admitted because of the acute renal failure with BUN 70 and  creatinine 3.28. Baseline creatinine is 1.54. His potassium also was elevated at 6.5 with no  ekg  changes.  Admitted to hospitalist service for acute renal failure, started on IV fluids, thought to be secondary to poor p.o. intake with dehydration and also was taking HCTZ. The patient's renal function improved with the fluids and creatinine also improved. His creatinine was 2.6 yesterday, BUN 47.  Plan is to stop HCTZ. Advised him to increase fluid intake. The patient to follow up with [facility] with Dr. Mady Haagensen first thing.  2.  Hyperkalemia. The patient has potassium of 6.5 on admission. Received Kayexalate, insulin with dextrose and calcium gluconate. His potassium repeat improved to 4.4.  3.  Urinary tract infection. The patient has been treated with  Cipro as an outpatient, so we are going to continue that. The patient's urine culture is not available. Bacteria 1+ in the urine here.  4.  Dementia with agitation.  The patient's CT head did not show any acute changes. The patient was seen by Dr. Garnetta BuddyFaheem. The patient had responded well to Risperdal before. The patient required a dose of Ativan, Haldol and Geodon on the 29th.  The patient was seen by a neurologist before and also psychiatric service before. He was not able to participate in exam yesterday, but he was trying to pull out the tubes, and the patient responded well to Risperdal in the past, so we will start him on Risperdal 0.25 mg p.o. b.i.d., and memantine also was started for memory issues.  The patient's  code status is DO NOT RESUSCITATE.  The plan is to continue Namenda and Risperdal.  5.  For his hypertension, he is on metoprolol. Heart rate is 89, blood pressure 142/90, so the dose that I told in discharge medications can be continued.  6.  Diabetes. The patient is not eating much. His p.o. intake. is really unpredictable. Continue sliding scale with coverage. When he can eat well, his 70/30 can be restarted. The patient's magnesium was 1.1, which is being replaced.   TIME SPENT ON DISCHARGE PREPARATION: More than 30 minutes.   CODE STATUS:  DO NOT RESUSCITATE.   ____________________________ Katha HammingSnehalatha Korrina Zern, MD sk:dmm D: 07/28/2013 10:12:26 ET T: 07/28/2013 10:30:00 ET JOB#: [id number]  cc: Katha HammingSnehalatha Bria Portales, MD, <Dictator> Mosetta PigeonHarmeet Singh, MD Ardeen FillersUzma S. Garnetta BuddyFaheem, MD Katha HammingSNEHALATHA Wave Calzada MD ELECTRONICALLY SIGNED 08/14/2013 23:24

## 2015-01-27 NOTE — Op Note (Signed)
PATIENT NAME:  Phillip Osborne, Phillip Osborne MR#:  [id number] DATE OF BIRTH:  [birth year]  DATE OF PROCEDURE:  08/26/2013  PREOPERATIVE DIAGNOSES:  1.  Cystitis.  2.  Systemic inflammatory response syndrome.  3.  Seizure disorder.   POSTOPERATIVE DIAGNOSES:  1.  Cystitis.  2.  Systemic inflammatory response syndrome.  3.  Seizure disorder.   PROCEDURES:  1. Ultrasound guidance for vascular access to right brachial vein.  2. Fluoroscopic guidance for placement of catheter.  3. Insertion of peripherally inserted central venous catheter, right arm.  SURGEON: Annice NeedyJason S. Dew, MD  ANESTHESIA: Local.   ESTIMATED BLOOD LOSS: Minimal.   INDICATION FOR PROCEDURE: A 81 year old white male with the above-mentioned issues who needs a PICC line for venous access.   DESCRIPTION OF PROCEDURE: The patient's right arm was sterilely prepped and draped, and a sterile surgical field was created. The right brachial vein was accessed under direct ultrasound guidance without difficulty with a micropuncture needle and permanent image was recorded. 0.018 wire was then placed into the superior vena cava. Peel-away sheath was placed over the wire. A single lumen peripherally inserted central venous catheter was then placed over the wire and the wire and peel-away sheath were removed. The catheter tip was placed into the superior vena cava and was secured at the skin at 38 cm with a sterile dressing. The catheter withdrew blood well and flushed easily with heparinized saline. The patient tolerated procedure well.   ____________________________ Annice NeedyJason S. Dew, MD jsd:np D: 08/26/2013 16:21:59 ET T: 08/26/2013 20:43:50 ET JOB#: [id number]  cc: Annice NeedyJason S. Dew, MD, <Dictator> Annice NeedyJASON S DEW MD ELECTRONICALLY SIGNED 09/13/2013 9:20

## 2015-01-27 NOTE — Consult Note (Signed)
Psychiatry: Dr Imogene Burnhen asked me about this patient today. On review of the chart I see I have not actually seen this patient this hospitalization. Dr Katrinka BlazingSmith from neurology was following the patient for the symptoms and problems that would have fallen under the heading of psychiatric, including delirium in detox and lasting dementia and behavior problems relating to cognitive damage. From reading his note I do not think there is anything different for us to evaluate other than what he has treated. I would prefer not to have two physicians trying to treat the same symptoms for fear of duplicating or overdoing meds. If there is something else psychiatry can do other than what neurology has already managed please call us back.  Electronic Signatures: Audery Amellapacs, Lyzette Reinhardt T (MD)  (Signed on 28-Aug-14 17:34)  Authored  Last Updated: 28-Aug-14 17:34 by Audery Amellapacs, Daianna Vasques T (MD)

## 2015-01-27 NOTE — Discharge Summary (Signed)
PATIENT NAME:  Phillip Osborne, Phillip Osborne MR#:  [id number] DATE OF BIRTH:  Apr 24, 1932  ADDENDUM  DATE OF ADMISSION:  08/21/2013 DATE OF DISCHARGE:  08/26/2013   ADDENDUM:  As mentioned above, the patient has seizure episodes. He was noted to be unresponsive initially in the facility which was felt by admitting physician to represent one episode of seizure. Also had one more seizure while en route to the Emergency Room. He had an electroencephalogram done on the 17th of November, 2014, read by Dr. Malvin JohnsPotter, which was unremarkable. Neurologist followed the patient along and recommended to change his Keppra to Vimpat which was done. The patient is to continue Vimpat at 100 mg twice daily dose.   No recurrences of seizures were noted while the patient was in the hospital.   While in the hospital, the patient was also noted to have generalized weakness. He was evaluated by a physical therapist who recommended home health physical therapy. While following the patient's blood count, it was noted that the patient's platelet count is going down, despite therapy, of sepsis. The patient's platelet count on arrival to the hospital the 14th of November 2014,  was 415; however, it declined slowly, progressively, to 140 on the day of discharge, the 19th of November 2014.   WE SUSPENDED THE PATIENT'S HEPARIN PRODUCTS AND SENT OUT HIT TESTING, AND WE WOULD LIKE TO EXTEND WARNING TO PHYSICIANS WHO ARE GOING TO SEE THE PATIENT IN THE FACILITY THAT THE PLATELET COUNT SHOULD BE FOLLOWED VERY CLOSELY, ESPECIALLY SINCE HEPARIN PRODUCTS CAN BE USED OR MAY BE USED IN PICC LINE FLUSHES AND DISCONTINUE HEPARIN FLUSHES IF HIT TESTING IS POSITIVE OR PLATELET COUNT DROPS DOWN.  THE PATIENT'S PLATELET COUNT WAS NOTED TO BE 140,000 ON THE 19TH OF NOVEMBER 2014.   The patient had subclavian central line placed on the left on the 14th of November 2014 which will be discontinued and PICC line placed on the 20th of November 2014 for IV antibiotic  use for 7 more days which will also be discontinued after antibiotics are infused.   The patient is being discharged in stable condition with the above-mentioned medications and follow-up.   DISCHARGE VITAL SIGNS: Temperature was 97.9. Pulse was 99. Respiratory rate was 18, blood pressure 132/78, saturation was 97% on room air at rest.   TIME SPENT: 40 minutes.   ____________________________ Katharina Caperima Reba Hulett, MD rv:np D: 08/26/2013 15:35:00 ET T: 08/26/2013 15:50:56 ET JOB#: [id number]  cc: Katharina Caperima Jaydrien Wassenaar, MD, <Dictator> Laurel Smeltz MD ELECTRONICALLY SIGNED 09/14/2013 15:45

## 2015-01-27 NOTE — H&P (Signed)
PATIENT NAME:  Phillip Osborne, Phillip Osborne MR#:  [id number] DATE OF BIRTH:  [birth year]  DATE OF ADMISSION:  07/26/2013  PRIMARY CARE PHYSICIAN:  Dr. Juleen China   CHIEF COMPLAINT: Fall.   HISTORY OF PRESENT ILLNESS: The patient is a 81 year old African American male with a past medical history of chronic renal insufficiency, residing at [facility], with a history of dementia. He was brought into the ER after he sustained a fall. The patient went to the bathroom, and when he was by himself in the bathroom he fell and hit his head on the sink. Because of the fall and laceration, he was brought into the ER. The laceration was repaired. CAT scan of the head was negative. CAT scan of the neck also revealed no fractures. The patient was diagnosed with acute sinusitis on CT of the head. The patient was diagnosed with acute cystitis on October 17, and he was started on p.o. levofloxacin for acute cystitis. In the ER, the patient seemed to be dehydrated. Renal function is abnormal with BUN 70 and creatinine 3.28.  His BUN was at 34 and creatinine 1.54 back in September 2014. The patient's potassium is elevated at 6.5 with no EKG changes. The patient has reported no chest pain, shortness of breath to the daughters. The patient was given sodium bicarbonate, D50, and insulin in the ER. During my examination, the patient is resting comfortably and two daughters were at bedside. The patient has a chronic history of alcohol abuse, which was stopped by him recently, but because of the chronic alcoholism he has chronic laryngitis and he usually whispers. No other complaints. No history of nausea, vomiting, diarrhea or dizziness.   PAST MEDICAL HISTORY: Chronic renal insufficiency, diabetes mellitus type 2, hypertension, history of seizures, alcohol abuse, dementia.  PAST SURGICAL HISTORY: None.   ALLERGIES: ACE INHIBITORS, LISINOPRIL, NORVASC.    PSYCHOSOCIAL HISTORY: Currently residing at [facility]. The patient has no  history of tobacco or drug abuse. He used to drink alcohol heavily and has stopped drinking recently after he was admitted to [facility].   FAMILY HISTORY: Diabetes runs in his sister.   HOME MEDICATIONS: Pantoprazole 40 mg once daily, NovoLog 70/30, 14 units subcutaneous once daily, metoprolol succinate 250 mg 1 tablet once daily, levetiracetam 500 mg 2 times a day, Levaquin 750 mg subcu q.24h., hydrochlorothiazide 12.5 mg once daily, haloperidol 2 mg 3 times a day, folic acid 1 mg once daily, Cymbalta 30 mg once daily, Ativan 1 mg p.o. q.6 hours as needed, aspirin 81 mg once daily, allopurinol 100 mg once a day.   REVIEW OF SYSTEMS:  Unobtainable, as the patient is with baseline dementia.   PHYSICAL EXAMINATION:  VITAL SIGNS: Temperature 97.9, pulse 72, respirations 20, blood pressure 106/77, pulse ox 94% on room air.  GENERAL APPEARANCE: Not in acute distress. Moderately built and nourished.  HEENT: Normocephalic. Pupils are equally reactive to light and accommodation. No scleral icterus. No conjunctival injection. Laceration at the nasal edge of the left eyebrow is repaired; no acute bleeding is noticed. No sinus tenderness. Dry mucous membranes. Uvula is midline. No angioedema.  NECK: Supple. No JVD or thyromegaly.  LUNGS: Clear to auscultation bilaterally. No accessory muscle use. No anterior chest wall tenderness on palpation. No peripheral edema.  CARDIAC: S1, S2 normal. Regular rate and rhythm.  GASTROINTESTINAL: Soft. Bowel sounds are positive in all four quadrants. Nontender, nondistended. No hepatosplenomegaly. No masses felt.  NEUROLOGIC: Awake, alert, but disoriented, not following verbal commands, but opens his  eyes to his name, and whispers and mumbles.  Reflexes are 2+. EXTREMITIES: No edema. No cyanosis. No clubbing. No peripheral edema. Peripheral pulses, dorsalis pedis and posterior tibialis, are intact.  SKIN: Warm to touch. Decreased turgor. No rashes. No lesions.   PSYCHIATRIC: Normal mood and affect could not be elicited as the patient is with dementia.  LABORATORIES AND IMAGING STUDIES: CT of the head without IV contrast has revealed no acute intracranial process. Soft tissue attenuating mass in the left sphenoid sinus with osseous expansion without osseous destruction, unchanged. Air-fluid levels in the left maxillary sinus correlate with acute sinusitis.   CT of the cervical spine: No acute fracture.   Serum glucose is 372, BUN 73, creatinine 3.28, sodium 134, potassium 6.5, chloride 105, CO2 27. GFR is 20. Anion gap 3. Serum osmolality 306.  Calcium is 10.0. Total protein 8.5, albumin 3.3, total bilirubin 2.2, alkaline phosphatase 162, AST 25, ALT 28.  WBC 6.0, hemoglobin 13.6 . ESR 10.1, platelets 339. Urinalysis from October 17: Yellow, cloudy,  nitrite is negative. WBC clumps are present.   A 12-lead EKG: Normal sinus rhythm at 79 beats per minute, normal PR interval, no acute ST-T wave changes.   ASSESSMENT AND PLAN: A 81 year old [place] American male, brought into the ER from [facility] after he sustained a fall. Will be admitted with the following assessment and plan.  1.  Acute on chronic renal insufficiency with hyperkalemia. No EKG changes. Will admit him to telemetry bed. Provide him IV fluids, Foley catheter. Nephrology consult is placed to Dr. Juleen China.  The patient has received sodium bicarbonate, D50 and insulin in the ER.  Kayexalate is ordered.  Repeat potassium is ordered.  Will check a.m. labs, potassium and magnesium.  2.  Acute sinusitis and acute cystitis. We will provide him IV levofloxacin.  3.  Status post fall and laceration on the nasal aspect of the left eyebrow, which was repaired.  Will continue wound care.  4.  Hypertension. Blood pressure is stable.  5.  Diabetes mellitus. We will provide him insulin sliding scale at this time.  6.  Chronic history of dementia.  7.  The patient will be provided with GI and deep vein  thrombosis prophylaxis with heparin subcutaneous.   CODE STATUS: He is DO NOT RESUSCITATE. Children are medical power of attorney.   Diagnosis and plan of care were discussed in detail with the patient's two daughters at bedside. They verbalized understanding of the plan.   TOTAL TIME SPENT ON ADMISSION: 45 minutes.  ____________________________ Nicholes Mango, MD ag:cg D: 07/26/2013 00:41:00 ET T: 07/26/2013 01:04:12 ET JOB#: [id number]  cc: Mamie Levers, MD Nicholes Mango MD ELECTRONICALLY SIGNED 08/11/2013 7:20

## 2015-01-27 NOTE — H&P (Signed)
PATIENT NAME:  Phillip Osborne, Phillip Osborne MR#:  [id number] DATE OF BIRTH:  June 25, 1932  DATE OF ADMISSION:  05/09/2013  PRIMARY CARE PHYSICIAN:  Dr. Leavy Cella of [place]   REFERRING PHYSICIAN:  Dr. Fanny Bien   CHIEF COMPLAINT:  Diarrhea.   HISTORY OF PRESENT ILLNESS: The patient is a pleasant, 81 year old African-American male with a history of diabetes, hypertension, seizure disorder, and alcoholic seizure and withdrawals in the past, CKD stage III. Of note, he presents with a 2-week history of diarrhea off and on, up to 2 to 4 episodes per day, loose, brown, watery. He had no sick contacts, and denies taking any recent antibiotics. He states that he has had a recent scan of his abdomen as an outpatient. He came in here with persistent symptoms, poor p.o. intake, and poor appetite. He was noted to have hyperkalemia with significant acidosis, with lactate of 5, with serum CO2 of 16. He also drinks heavily, about a pint of vodka a day. He states that his last drink was yesterday. He is also noted to have some minor tremors and some signs of withdrawal, and he was given some Ativan. A CT of abdomen and pelvis was repeated with contrast p.o., which shows no colitis. Hospitalist services were contacted for further evaluation and management.   PAST MEDICAL HISTORY: History of alcoholic seizures, seizure disorder, hypertension, diabetes, CKD stage III, alcohol withdrawal, alcoholic DTs.   SOCIAL HISTORY:  No tobacco or drug use. Lives by himself, and occasionally girlfriend stays with him. He drinks a pint of vodka a day most days, last drink yesterday.   ALLERGIES:  ACE INHIBITOR, LISINOPRIL, NORVASC.   OUTPATIENT MEDICATIONS:  Acetaminophen/tramadol 325/37.5 mg 1 tab every 6 to 8 hours as needed for pain, allopurinol 100 mg daily, amlodipine 10 mg daily, aspirin 81 mg daily, hydrochlorothiazide 12.5 mg daily, Keppra 500 mg 2 times a day, magnesium oxide 400 mg 2 times a day, metoprolol extended release 50 mg once a  day, NovoLog 70/30 mix, he told me he takes about 14 units in the morning and about 8 units at night, sildenafil p.r.n.   FAMILY HISTORY:  Diabetes runs in a sister.   REVIEW OF SYSTEMS:  CONSTITUTIONAL:  No fever, but has occasional chills. Had a 7-pound weight loss recently, in the last 6 months.  HEENT: No blurry vision or double vision. Has had eye surgery on the right.  EARS, NOSE, THROAT: No tinnitus, hearing loss, postnasal drip.  RESPIRATORY:  No cough, wheezing, shortness of breath or hemoptysis.  CARDIOVASCULAR:  No chest pain, swelling in the legs or arrhythmia. Has hypertension. GASTROINTESTINAL:  One episode of vomiting today, but otherwise has had no significant vomiting, while he did have the diarrhea. No abdominal pain. No dark stools. No bloody stools. Possibly one episode of red stuff in the stool, but otherwise no blood reported. Diarrhea is brown.    GENITOURINARY:  Denies frequency, but has occasional dysuria.  HEMATOLOGIC/LYMPHATIC:  No anemia or easy bruising.  SKIN:  No rashes.  MUSCULOSKELETAL:  Denies acute gout, but has some right shoulder pain after a fall a  couple of months ago. NEUROLOGIC:  History of seizures.  PSYCHIATRIC:  Denies anxiety or insomnia.   PHYSICAL EXAMINATION: VITAL SIGNS: Temperature on arrival 97.4, pulse rate 77, respiratory rate 18, blood pressure 103/57, O2 sat 97% on room air.  GENERAL:  The patient is a well-developed, African-American male, lying in bed in no obvious distress.  HEENT: Normocephalic, atraumatic. Pupils are equal and reactive. Anicteric  sclerae. Dry mucous membranes.  NECK:  Supple. No thyroid tenderness. No cervical lymphadenopathy.  CARDIOVASCULAR: S1, S2. Distant heart sounds, but no significant murmurs could be appreciated.  LUNGS:  Clear to auscultation. Without wheezing, rhonchi or rales.  ABDOMEN:  Hyperactive bowel sounds, but nontender. No rebound or guarding.  EXTREMITIES: No significant lower extremity edema.   NEUROLOGIC: Cranial nerves II to XII grossly intact. Strength is 5/5 all extremities. Sensation is intact to light touch.  SKIN:  No obvious rashes.  PSYCHIATRIC:  Awake, alert, oriented x 3. The patient does have mild tremors on stretching of his hands.   LABS AND IMAGING: CT of abdomen and pelvis shows no evidence of obstructive or inflammatory abnormalities. He does appear to have gastric diverticulum, a small hiatal hernia. No abdominal aortic aneurysm.   Glucose 123, BUN 35, creatinine 1.54, sodium 131, potassium 5.7, serum CO2 is 16, GFR 49. Alcohol percentage is 0.068. Lipase 133, calcium 9.2, total protein 9, otherwise LFTs are within normal limits. WBC 5, hemoglobin 15.4, platelets are 238. Stool cultures so far shows no Campylobacter. VBG showed pH of 7.2, pCO2 of 40, lactic acid of 5.   EKG: Normal sinus rhythm, rate is 78, left axis deviation, no acute ST elevations or depressions.   ASSESSMENT AND PLAN:  We have a 81 year old male with history of alcoholic withdrawal and seizures, who is on Keppra; diabetes chronic kidney disease stage III, hypertension, who presents with subacute diarrhea for 2 weeks without sick contacts or recent antibiotics, per him. He has had some weight loss and has poor p.o. intake, and appears to be dehydrated, with lactic acidosis, and will be admitted to the hospital. We would follow with the stool cultures, which have been sent. He does not appear to have a colitis. He has no fever or leukocytosis. We have also sent in C. diff, and we are waiting for that. In the meantime, we are going to start the patient on some IV fluids and obtain a GI consult.   In regards to the hyperkalemia, although he is having diarrhea, his potassium is significantly elevated to 5.7. We would hold the hydrochlorothiazide, give the patient a one-time half-dose Kayexalate at a 15-gram dose, and recheck another BNP later tonight. Also give the patient some insulin and D50. He does appear  to have significant metabolic acidosis, which could be secondary to dehydration as well as ethanol. Start the patient on some IV fluids. He does have some dysuria, and we will go ahead and check a UA and urine cultures, as he has had no UA so far. Would continue his diabetes medications at a lower dose, as he will be started on clears because of his significant diarrhea, and would advance the diet as tolerated. We would also continue the Keppra, the metoprolol.   In regards to withdrawal, he has alcohol on board. He has had history of withdrawals and DTs and seizures. He is at high risk for developing further withdrawals, and therefore would start him on CIWA protocol, multivitamin, folate, thiamine, and standing Librium for now. We will start him on DVT with heparin.   The patient is a FULL CODE.   Total time spent is 65 minutes.     ____________________________ Krystal EatonShayiq Haasini Patnaude, MD sa:mr D: 05/09/2013 17:21:00 ET T: 05/10/2013 18:59:33 ET JOB#: [id number]  cc: Krystal EatonShayiq Aiden Rao, MD, <Dictator> Krystal EatonSHAYIQ Maeghan Canny MD ELECTRONICALLY SIGNED 05/30/2013 12:25

## 2015-01-27 NOTE — Consult Note (Signed)
Brief Consult Note: Diagnosis: Diarrhea, dysphagia, weight loss, Alcoholism.   Comments: Dr. Mechele CollinElliott and I discussed this case and he recommends eventual EGD/colonoscospy in a few days after pt is over risk for DTs.  Electronic Signatures: Rowan BlaseMills, Temika Sutphin Ann (NP)  (Signed 04-Aug-14 17:30)  Authored: Brief Consult Note   Last Updated: 04-Aug-14 17:30 by Rowan BlaseMills, Paulino Cork Ann (NP)

## 2015-01-27 NOTE — Consult Note (Signed)
PATIENT NAME:  Phillip Osborne, Phillip Osborne MR#:  [id number] DATE OF BIRTH:  July 19, 1932  DATE OF CONSULTATION:  06/03/2013  REFERRING PHYSICIAN:   CONSULTING PHYSICIAN:  Sadira Standard K. Sherryll BurgerShah, MD  ADDENDUM to 06/03/2013 report.  On his exam, please add that I cannot check movement of his palate, his neck or shoulder shrug ability due to his poor level of alertness.   ____________________________ Durene CalHemang K. Sherryll BurgerShah, MD hks:jm D: 06/17/2013 16:18:00 ET T: 06/17/2013 16:48:55 ET JOB#: [id number]  cc: Merrel Crabbe K. Sherryll BurgerShah, MD, <Dictator> Durene CalHEMANG K [facility] MD ELECTRONICALLY SIGNED 06/21/2013 16:53
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
## 2015-01-28 NOTE — H&P (Signed)
PATIENT NAME:  Phillip Osborne, Phillip Osborne MR#:  [id number] DATE OF BIRTH:  November 28, 1931  DATE OF ADMISSION:  04/14/2014  PRIMARY CARE PHYSICIAN:  Dr. Maryellen Pile.  CHIEF COMPLAINT: Sent in for abnormal labs.   HISTORY OF PRESENT ILLNESS:  Phillip Osborne is a 81 year old African American male with a past medical history significant for CKD, stage III, baseline creatinine around 1.7, dementia, diabetes, and hypertension who was recently in the hospital about 10 days ago.  At that time also sent in for hyperkalemia with potassium of 6.1, and acute renal failure, creatinine about 3.  The patient's Lasix was stopped at the time.  His creatinine improved to 2.6, so he was discharged, and his potassium also improved after Kayexalate and insulin and dextrose treatments. The patient is not on any ACE inhibitors or ARBS and does not take any medication that causes potassium to go up.  He had follow-up labs done today and his potassium is 6.9, so sent over to the ER. The patient denies any chest pain, palpitations, or muscle weakness. He has myoclonic jerks occasionally, which are chronic, going on for a long time. So he is being admitted for hyperkalemia.   PAST MEDICAL HISTORY:  1.  Dementia.  2.  Dysphagia.  3.  Generalized muscle weakness.  4.  Unsteady gait.  5.  CKD stage III at baseline.  6.  Depression and anxiety.  7.  Paralysis agitans.  8.  Gastroesophageal reflux disease.  9.  Insulin-dependent diabetes mellitus.  10.  Seizure disorder.  11.  Hypertension.  12.  Hypothyroidism.   PAST SURGICAL HISTORY: None.   ALLERGIES TO MEDICATIONS: ACE INHIBITORS AND NORVASC.   CURRENT HOME MEDICATIONS:  1.  Keppra 250 mg p.o. b.i.d.  2.  Lacosamide 150 mg p.o. b.i.d.  3.  Tylenol 650 mg q. 4 hours p.r.n.  4.  Allopurinol 100 mg p.o. daily.  5.  Cymbalta 30 mg p.o. daily.  6.  Depakote 125 mg sprinkles, 2 capsules once a day at bedtime.  7.  Colace 100 mg p.o. b.i.d.  8.  Folic acid 1 mg p.o. daily.  9.   Guaifenesin 10 mL q. 6 hours p.r.n. for cough.  10.  Lantus 20 units subcutaneous daily.  11.  Memantine 5 mg p.o. daily.  12.  Sliding scale insulin.  13.  Omeprazole 20 mg p.o. daily.  14.  Sodium bicarbonate 650 mg p.o. t.i.d.  15.  Kayexalate 30 grams p.r.n. for potassium greater than 5.   SOCIAL HISTORY: He has been a resident at [facility].  No smoking or alcohol use.   FAMILY HISTORY: Significant for hypertension.   REVIEW OF SYSTEMS:  CONSTITUTIONAL: No fever, fatigue or weakness.  EYES: No blurred vision, double vision, inflammation or glaucoma.  EAR, NOSE, AND THROAT: No tinnitus, ear pain, hearing loss, epistaxis or discharge.  RESPIRATORY: Positive for chronic cough. No COPD or dyspnea.  CARDIOVASCULAR: No chest pain, orthopnea, edema, arrhythmia, palpitations, or syncope.  GASTROINTESTINAL: No nausea, vomiting, diarrhea, abdominal pain, hematemesis, or melena.  GENITOURINARY: No dysuria, hematochezia, renal calculus, frequency, or incontinence.  ENDOCRINE: No polyuria, nocturia, thyroid problems, heat or cold intolerance.  HEMATOLOGY: No anemia, easy bruising or bleeding.  SKIN: No acne, rash or lesions.  MUSCULOSKELETAL: Generalized weakness present, but no arthritis.  Positive for gout.  NEUROLOGIC: No numbness, weakness, CVA, or TIA.  Positive for seizures.  PSYCHOLOGICAL: No anxiety, insomnia, depression.   PHYSICAL EXAMINATION:  VITAL SIGNS: Temperature 97.7 degrees Fahrenheit, pulse 73, respirations 18, blood  pressure 119/75, pulse oximetry 100% on room air.  GENERAL: Well-built, well-nourished male lying in bed, not in any acute distress.  HEENT: Normocephalic, atraumatic. Pupils equal, round, reacting to light. Anicteric sclerae. Extraocular movements intact. Oropharynx clear without any erythema, mass or exudates.  NECK:  Supple without thyromegaly, JVD, or carotid bruits. No lymphadenopathy.  LUNGS: Moving air bilaterally. No wheeze or crackles. No use  of accessory muscles for breathing.  CARDIOVASCULAR: S1, S2, regular rate and rhythm. No murmurs, rubs, or gallops.  ABDOMEN: Obese, soft, nontender, nondistended. No hepatosplenomegaly. Normal bowel sounds.  EXTREMITIES: No pedal edema. No clubbing or cyanosis, 2+ dorsalis pedis palpable bilaterally.  SKIN: No acne, rash or lesions.  LYMPHATICS: No cervical lymphadenopathy.  NEUROLOGIC: Cranial nerves intact. No focal motor or sensory deficits.  PSYCHOLOGICAL: The patient is awake, alert, oriented x 3.   LABORATORY DATA: WBC 5.3, hemoglobin 11.3, hematocrit 34.9, platelet count 199,000.   Sodium 129, potassium 6.9, chloride 101, bicarbonate 18, BUN 40, creatinine of 2.83, glucose 206 and calcium of 8.7.   ALT 27, AST 25, alkaline phosphatase 96, total bilirubin 0.3, and albumin of 3.9.   Chest x-ray showing no active disease.   Urine is pending. Troponin is negative.   EKG showing normal sinus rhythm, heart rate of 72. No acute ST-T wave abnormalities.   ASSESSMENT AND PLAN: A 81 year old male with chronic kidney disease, stage III, baseline  creatinine 1.7, dementia, diabetes, hypertension from [facility] sent in for hyperkalemia and acute renal failure.   1.  Acute hyperkalemia secondary to acute renal failure. No significant EKG changes.  We will admit to telemetry.  Calcium gluconate, insulin, dextrose and Kayexalate have been ordered. Repeat check. He is not on any medications can increase potassium.  If persistent hyperkalemia after 24-hour creatinine clearance has been ordered, he might need to be on dialysis at some point.  No urgent indication at this time. Follow up repeat potassium. Nephrology has been consulted.   2.  Acute renal failure, baseline creatinine of 1.7 at discharge and the sedimentation rate was 2.6, now again 3.8. Gentle IV hydration, Renal ultrasound already done 10 days ago, did not show any acute obstruction. Ordered 24-hour creatinine clearance.   3.   Hypertension. Continue home medications.   4.  Diabetes mellitus. Continue Lantus and sliding scale insulin.   5.  Seizure disorder stable on Keppra and lacosamide.   6.  Dementia at baseline.  7.  Deep vein thrombosis prophylaxis   8.  CODE STATUS: FULL CODE.   TIME SPENT ON ADMISSION: 50 minutes.    ____________________________ Enid Baasadhika Emmanuel Gruenhagen, MD rk:ts D: 04/14/2014 18:34:07 ET T: 04/14/2014 19:30:56 ET JOB#: [id number]  cc: Enid Baasadhika Saksham Akkerman, MD, <Dictator> Serita ShellerErnest B. Maryellen PileEason, MD Enid BaasADHIKA Emmi Wertheim MD ELECTRONICALLY SIGNED 04/15/2014 12:01

## 2015-01-28 NOTE — Consult Note (Signed)
PATIENT NAME:  Phillip Osborne, PETITJEAN MR#:  [id number] DATE OF BIRTH:  Feb 01, 1932  DATE OF CONSULTATION:  04/16/2014  REFERRING PHYSICIAN:  Munsoor Lateef, MD CONSULTING PHYSICIAN:  A. Lavone Orn, MD  CHIEF COMPLAINT: Hyperkalemia.   HISTORY OF PRESENT ILLNESS: This is a 81 year old male seen in consultation at the request of Dr. Holley Raring for hyperkalemia. The patient has a history of hypertension, diabetes, CKD stage III, and has had recurrent hyperkalemia. The patient was admitted 2 days ago with a serum potassium of 6.9 and creatinine 2.83. He has been treated with IV fluids, Kayexalate, and today serum potassium was greatly normalized at 4.6. The patient was also admitted with hyperkalemia and acute or chronic renal insufficiency last month and also in February, although that admission was in the setting of a urinary tract infection as well. Typically with supportive care, potassium has normalized by the time of discharge. The patient resides at a nursing home where medications are given. He denies any known use of nonsteroidal antiinflammatory drugs. He has not recently been exposed to ACE inhibitors or ARB, as best I could tell. He does have a drug allergy listed as ACE inhibitors, likely due to prior history of hyperkalemia in that setting. He has not been exposed to glucocorticoids. A serum cortisol yesterday was 20. The patient is a poor historian and most of history is obtained from a chart review.   PAST MEDICAL HISTORY:  1.  Diabetes.  2.  Hypertension.  3.  Dementia.  4.  Stage III chronic kidney disease.  5.  History of seizures.  6.  History of alcohol abuse/dependence.   CURRENT MEDICATIONS:  1.  Allopurinol 100 mg daily.  2.  Valproic acid 250 mg at bedtime.  3.  Docusate 100 mg b.i.d.  4.  Duloxetine 30 mg daily.  5.  Folic acid 1 mg daily.  6.  Vimpat 150 mg q. 12 hours.  7.  Keppra 250 mg q. 12 hours.  8.  Namenda 5 mg daily.  9.  Pantoprazole 40 mg daily.  10.  Sodium  bicarbonate 650 mg t.i.d.  11.  NovoLog insulin sliding scale q. a.c. and at bedtime.  12.  Levemir 10 units at bedtime.   ALLERGIES: ACE inhibitors and Norvasc.   SOCIAL HISTORY: The patient has a history of heavy alcohol abuse. Reportedly stopped drinking last month. No tobacco use. He states he lives in a nursing facility.   FAMILY HISTORY: Diabetes.   REVIEW OF SYSTEMS:  GENERAL: Denies weight loss. Denies fevers.  HEENT: Denies blurred vision. Denies sore throat.  NECK: Denies neck pain or dysphagia.  CARDIAC: Denies chest pain or palpitation.  PULMONARY: Denies shortness of breath. Denies cough.  ABDOMEN: Reports good appetite. Denies recent change in bowel habits.  Denies diarrhea or constipation.  EXTREMITIES: Denies leg swelling.  HEMATOLOGIC: Denies easy bruisability or recent bleeding.  ENDOCRINE:  Denies heat or cold intolerance.   PHYSICAL EXAMINATION:  VITAL SIGNS: Height 65 inches, weight 136 pounds, BMI 22, temperature 97.8, pulse 78, respirations 18, blood pressure 125/76, pulse oximetry 100% on room air.  GENERAL: African American male, no acute distress.  HEENT: EOMI. Oropharynx is clear. Mucous membranes moist.  NECK: Supple. No thyromegaly appreciated.  LYMPH: No submandibular or supraclavicular lymphadenopathy noted.  CARDIAC: Regular rate and rhythm without murmur.  PULMONARY: Clear to auscultation bilaterally. No wheeze or rhonchi. Good inspiratory effort.  ABDOMEN: Diffusely soft, nontender, nondistended.  EXTREMITIES: No peripheral edema is present.  NEUROLOGIC: No dysarthria. The  patient is alert.  PSYCHIATRIC: Calm and cooperative.   LABORATORY RESULTS: Glucose 73, BUN 38, creatinine 2.5, sodium 139, potassium 4.6, chloride 108, bicarbonate 22, eGFR 27, albumin 3.3.  On 04/15/2014, at 11:00 a.m., cortisol 20.0.   ASSESSMENT: A 81 year old male with long-standing hypertension, diabetes, stage III chronic kidney disease, who has had recurrent severe  hyperkalemia in the setting of acute on chronic renal failure. Causes of hyperkalemia may include medication use, adrenal insufficiency, or renal  tubular acidosis. As he is not on any offending medication and has had a normal cortisol, medication use and adrenal insufficiency have effectively been ruled out. He may have an underlying type I or type II renal tubular acidosis; however, he is currently on sodium bicarbonate with the persistent hyperkalemia, so I am more concerned about a type IV renal tubular acidosis (hypoaldosteronism) is the cause.   RECOMMENDATIONS:  1.  We will check a serum renin and aldosterone level today. Would expect these to be low in the setting of hypoaldosteronism. If they are, could consider treatment with a low-dose mineralocorticoid.    2.  We will check a urine pH, which also tends to be low.   3.  Continue sodium bicarbonate.   4.  Continue to avoid offending medications including nonsteroidal antiinflammatory drugs, ACE inhibitors, and ARBs. Needs to avoid dehydration as this seems to precipitate in part his worsened renal function and then worsened hyperkalemia.   I will follow along with you.    ____________________________ A. Lavone Orn, MD ams:ts D: 04/16/2014 12:45:18 ET T: 04/16/2014 14:54:09 ET JOB#: [id number]  cc: A. Lavone Orn, MD, <Dictator> Sherlon Handing MD ELECTRONICALLY SIGNED 05/02/2014 22:44

## 2015-01-28 NOTE — Discharge Summary (Signed)
PATIENT NAME:  Phillip Osborne, Phillip Osborne MR#:  [id number] DATE OF BIRTH:  Aug 29, 1932  DATE OF ADMISSION:  11/22/2013 DATE OF DISCHARGE:  11/25/2013  PRESENTING COMPLAINT: Seizures.  DISCHARGE DIAGNOSES: 1.  Seizures, suspected due to renal failure, improved. No more seizures.  2.  History of chronic alcoholism.  3.  Type 2 diabetes, on sliding scale.  4.  Acute on chronic renal failure, improved with IV fluids.  5.  Hypotension, asymptomatic, resolved.   CODE STATUS: NO CODE, DNR.  DISPOSITION: Discharge to rehab facility.    LABS AT DISCHARGE: Glucose is 216, BUN 23, creatinine 1.55, sodium 138, potassium 4.2, chloride 106, and bicarbonate 25. White count is 4.2, H and H 10.1 and 29.7, and platelet count 192. Magnesium is 1.9. Hemoglobin is A1c 7.2. TSH is 4.84. Potassium is 4.8. UA negative for UTI.  BRIEF SUMMARY OF HOSPITAL COURSE: [name] is a 81 year old African American gentleman with history of seizure disorder, hypertension, and diabetes who comes in with:  1.  Seizures x2 from the facility. He was admitted on the medical floor with seizure precautions. He did have some acute on chronic renal failure with dehydration, which could have triggered his seizures. He was resumed back on his Keppra, Depakote, and Lamictal. He was monitored. He did not have any further more seizures here in the hospital. His sugars were stable.  2.  The patient has a history of chronic alcoholism; however, he is at a facility at this time and he reports not drinking at present. 3.  Type 2 diabetes, on insulin. His Levemir was changed to daytime, prior to lunch, since his morning sugars were staying on the lower side; however, the patient remained asymptomatic with those hypoglycemic sugars. He is advised to take a bedtime snack.  4.  Acute on chronic renal failure with hyperkalemia, improved with IV hydration. The patient's creatinine is down to 1.5. He is back on her sodium bicarbonate pills and his Lasix is  changed to every other day.  5.  Relative hypotension, resolved.  6.  Physical therapy recommends PT. The patient came in from a rehab facility. He is seizure free since admission. He will be discharged back to rehab today. He remains a NO CODE, DNR.   DISCHARGE NURSING INSTRUCTIONS:  1.  Mechanical soft, low-fat, low-cholesterol, ADA 1800 calorie diet. Please give bedtime snack.  2.  Seizure precautions.  3.  Physical therapy.   DISCHARGE MEDICATIONS: 1.  Allopurinol 100 mg daily.  2.  Lasix 20 mg every other day.  3.  Omeprazole 20 mg daily.  4.  Insulin Levemir 10 units subcu q. 24 prior to lunch.  5.  Sliding scale insulin.  6.  Folic acid 1 mg daily.  7.  Namenda 5 mg daily.  8.  Tylenol 650 q. 4 [name]  9.  Duloxetine that is Cymbalta 30 mg daily.  10.  Vimpat 150 mg b.i.d.  11.  Keppra 250 mg p.o. b.i.d.  12.  Sodium bicarbonate 650 p.o. t.i.d.  13.  Depakote sprinkles 250 mg at bedtime.   TIME SPENT: 40 minutes. ____________________________ Wylie HailSona A. Allena KatzPatel, MD sap:sb D: 11/25/2013 13:12:55 ET T: 11/25/2013 13:41:59 ET JOB#: [id number]  cc: Desiray Orchard A. Allena KatzPatel, MD, <Dictator> Serita ShellerErnest B. Maryellen PileEason, MD Laverda SorensonSarath C. Kolluru, MD Willow OraSONA A Theodor Mustin MD ELECTRONICALLY SIGNED 11/25/2013 17:25

## 2015-01-28 NOTE — Discharge Summary (Signed)
PATIENT NAME:  Phillip Osborne, Phillip Osborne#:  [id number] DATE OF BIRTH:  10/27/1931  DATE OF ADMISSION:  04/14/2014.  DATE OF DISCHARGE:  04/17/2014.  PRIMARY CARE PHYSICIAN:  Dr. Maryellen Osborne.   FINAL DIAGNOSES:  1.  Acute renal failure on chronic kidney disease.  2.  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  3.  Relative hypotension.  4.  Right frozen shoulder and tremor.  5.  Seizure disorder.  6.  Dementia.   MEDICATIONS ON DISCHARGE: Include folic acid 1 mg daily, allopurinol 100 mg daily, Cymbalta 30 mg daily, Namenda 5 mg daily, acetaminophen 325 mg 2 tablets every 4 hours as needed for fever or pain, Colace 100 mg twice a day, Keppra 250 mg twice a day, Depakote sprinkles 125 mg 2 capsules once a day at bedtime, lacosamide 150 mg twice a day, omeprazole 20 mg daily, sodium bicarbonate 650 mg 3 times a day, NovoLog FlexPen 4 times a day as needed for sliding scale, Kayexalate 120 mL once as needed for potassium greater than 5, Lantus decreased to 10 units subcutaneous injection at bedtime, fludrocortisone 0.1 mg tablet daily.   HOME HEALTH: None.   TREATMENTS: None.   DIET: Low potassium diet. Must stay hydrated. Regular consistency.   ACTIVITY: As tolerated.   FOLLOWUP: Dr. Tedd Osborne, endocrinology, in 1 week; Dr. Cherylann Osborne, nephrology, in 2 weeks.  Physical therapy for right shoulder 1-2 days, Dr. Maryellen Osborne.  Recommend checking a creatinine and potassium every Wednesday with results to go to both Dr. Tedd Osborne and Dr. Cherylann Osborne in nephrology. Can also consider an MRI of the shoulder or orthopedic consultation if physical therapy does not help the right shoulder.  HOSPITAL COURSE: The patient was admitted 04/14/2014, discharged 04/17/2014. Sent in for abnormal labs, potassium of 6.1. The patient was admitted for hyperkalemia, acute renal failure on chronic kidney disease. The patient was given Kayexalate and IV fluid hydration, nephrology consultation was done by Dr. Cherylann Osborne. Endocrine consultation  was done by Dr. Tedd Osborne.   LABORATORY AND RADIOLOGICAL DATA DURING THE HOSPITAL COURSE: Included a glucose of 206, BUN 40, creatinine 2.83, sodium 129, potassium 6.9, chloride 101, CO2 18, calcium 8.7. Liver function tests normal range. Troponin negative. White blood cell count 5.5, H and H 11.3 and 34.9, platelet count 199. Repeat potassium 6.6. Repeat creatinine went up to 3.04. Repeat potassium after that 6.3. Serum cortisol RIA 20, which was elevated.  On the 10th potassium was 5.9, creatinine 2.95.  On the 11th, potassium 4.6, creatinine 2.52, on the 12th creatinine 2.17, potassium 5.0.   Chest x-ray showed no active disease.   HOSPITAL COURSE PER PROBLEM LIST:  1.  Acute renal failure on chronic kidney disease. The patient was given IV fluid hydration. The patient must stay hydrated.  Hydration status was discussed at length with the patient.  2.  Hyperkalemia.  Possible hypoaldosteronism type IV RTA, rennin and aldosterone still pending. Dr. Tedd Osborne and Dr. Cherylann Osborne saw the patient in consultation. Low dose fludrocortisone was started. I still do recommend following up potassium and creatinine on a weekly basis at this point. Hopefully will not need any further Kayexalate, the fludrocortisone should help out with keeping the potassium in check.  3.  Relative hypotension. The patient is not on any blood pressure medications. The fludrocortisone can raise the blood pressure.  4.  Frozen right shoulder and tremor when moving his shoulder. I do recommend starting with physical therapy.  If no improvement with physical therapy, can consider an MRI of the shoulder or  orthopedic consultation.  5.  Seizure disorder, on Keppra and lacosamide. No seizures while in the hospital.  6.  Dementia, on Namenda. 7.  Diabetes. Did have a low sugar while here. May not need as much insulin when the patient has chronic kidney disease. I decreased the Lantus down to 10 units.   Time Spent on discharge: 35 minutes.     ____________________________ Phillip Osborne. Phillip Gloss, MD rjw:lt D: 04/17/2014 12:27:44 ET T: 04/17/2014 13:18:40 ET JOB#: [id number]  cc: Phillip Osborne. Phillip Gloss, MD, <Dictator> Phillip Osborne. Phillip Pile, MD A. Wendall Mola, MD Phillip Lizabeth Leyden, MD Phillip Scarlet MD ELECTRONICALLY SIGNED 04/20/2014 15:18

## 2015-01-28 NOTE — Consult Note (Signed)
Chief Complaint and History:  Referring Physician Dr. Cherylann RatelLateef   Chief Complaint Hyperkalemia   Allergies:  Ace Inhibitors: Swelling  Lisinopril: Swelling  Norvasc: Swelling  Assessment/Plan:  Assessment/Plan 81 to M with h/o HTN, DM, stage 3 CKD, dementia, alcoholuism, seizures admitted with acute on chronic renal failure and severe hyperkalemia. Potassium normalized with IVF and kayexalate and today level is 4.6. In reviewing his records, he has a h/o hyperkalemia with has been intermittent over the last 6+ months and worsened as renal function worsened. He was hospitalized in 03/2014 and 11/2013 for this issue. He has had a normal cortisol of 20. No significant metabolic acidosis, although he is on TID sodium bicarb. Urine pH in the 5-7 range. No use of ARB/ACE-I, glucocorticoids, NSAIDS. He resides at a NH where medications are managed.  Patient was interviewed, examined and chart reviewd.   A/ Hyperkalemia  Acute on CKD HTN Diabetes mellitus type 2 , controlled  P/ Causes may include medications, adrenal insufficiency, and metabolic acidosis. In this patient based on history and lab work-up to date, I suspect hyperkalemia may be due due a type IV RTA (hypoaldosteronism). Will check a repeat urine, as this is typically assoc with an acidic urine (pH <5.5) and also will check a renin-aldo, which also are expected to be low. If labs confirm hypoaldosteronism, then could treat with a mineralocorticoid (eg fludrocortisone 0.5 mg daily). Would continue sodium bicarb.   I will follow with you.   Electronic Signatures: Raj JanusSolum, Abiel Antrim M (MD)  (Signed 11-Jul-15 12:46)  Authored: Chief Complaint and History, ALLERGIES, HOME MEDICATIONS, Assessment/Plan   Last Updated: 11-Jul-15 12:46 by Raj JanusSolum, Santiana Glidden M (MD)

## 2015-01-28 NOTE — Discharge Summary (Signed)
PATIENT NAME:  Phillip Phillip Osborne, Phillip Phillip Osborne MR#:  [id number] DATE OF BIRTH:  [birth year]  DATE OF ADMISSION:  04/03/2014 DATE OF DISCHARGE:  04/04/2014  ADMISSION DIAGNOSES: 1.  Acute hyperkalemia.  2.  Acute renal failure.   DISCHARGE DIAGNOSES:  1.  Hyperkalemia. 2.  Acute renal failure, likely secondary to Lasix and poor p.o. intake.  3.  Dementia. 4.  History of seizures.   CONSULTATIONS: Nephrology.   DIAGNOSTIC DATA: White blood cells 4.4, hemoglobin 11, hematocrit 33, platelets 172,000. Potassium 5. Creatinine 2.82 at discharge.   HOSPITAL COURSE: A 81 year old male who presented from a nursing home with abnormal labs, found to have hyperkalemia and acute renal failure. For further details, please refer to the H and P. 1.  Hyperkalemia. Treated with Kayexalate, insulin, and dextrose and has improved. Likely this is from acute renal failure. The patient will need repeat potassium tomorrow.  2.  Acute renal failure, on chronic kidney disease, stage III. Likely a component of prerenal azotemia from not drinking adequate fluids as well as Lasix. Lasix is on hold for now. The patient will follow up with Dr. Wynelle LinkKolluru his primary nephrologist in 2 days.  3.  Dementia. The patient will continue outpatient medications.  4.  History of seizure disorder. Continue outpatient medications.  5.  Diabetes. The patient's blood sugars were controlled on his current dose of insulin.   DISCHARGE MEDICATIONS: 1.  Folic acid 1 mg daily.  2.  Allopurinol 100 mg daily.  3.  Cymbalta 30 mg daily.  4.  Memantine 5 mg at bedtime.  5.  Acetaminophen 325 two tablets q. 4 hours p.Phillip Osborne.n.  6.  Docusate 100 mg b.i.d.  7.  Guaifenesin 10 mL q. 6 hours p.Phillip Osborne.n.  8.  Keppra 250 mg b.i.d.  9.  Depakote sprinkles 125 to 250 mg at bedtime.  10.  Lacosamide 150 mg b.i.d.  11.  Lantus 20 units at bedtime.  12.  Omeprazole 20 mg daily.  13.  Sodium bicarb 650 t.i.d.  14.  Sliding scale insulin.  15.  Kayexalate 120 mL p.Phillip Osborne.n.  potassium greater than 5.   DISCHARGE DIET: Low sodium, ADA.  DISCHARGE ACTIVITY: As tolerated.   DISCHARGE REFERRAL: Physical therapy.   DISCHARGE FOLLOWUP: The patient will follow up with Dr. Wynelle LinkKolluru in 2 days. The patient needs a BMP tomorrow.   The patient is medically stable for discharge.   TIME SPENT: 40 minutes.   ____________________________ Janyth ContesSital P. Juliene PinaMody, MD spm:sb D: 04/04/2014 09:53:57 ET T: 04/04/2014 10:42:58 ET JOB#: [id number]  cc: Sital P. Juliene PinaMody, MD, <Dictator> Janyth ContesSITAL P MODY MD ELECTRONICALLY SIGNED 04/04/2014 13:15

## 2015-01-28 NOTE — H&P (Signed)
PATIENT NAME:  Phillip Osborne, Phillip Osborne MR#:  [id number] DATE OF BIRTH:  1932/06/15  DATE OF ADMISSION:  11/22/2013  REFERRING PHYSICIAN: Dr. Shaune PollackLord.   PRIMARY CARE PHYSICIAN: Dr. Maryellen PileEason.   NEPHROLOGIST:  Dr. Wynelle LinkKolluru.   CHIEF COMPLAINT:  Status post seizure x 2.   HISTORY OF PRESENT ILLNESS: The patient is a pleasant 81 year old African American male with a history of CKD, epilepsy, dementia who was hospitalized here in early December with UTI and seizures. He is a resident of [facility]. He was seeing his nephrologist where he had 2 episodes of seizures there and then was referred here. At that time also he was noted to have worsening renal failure with hyperkalemia. The seizure was mostly tremors in upper extremities and also the patient was "zoning out" per daughters, who were in the room. Apparently, the patient is back to his baseline. His potassium is 5.9. His BUN is 40, creatinine is 2.13. Hospitalist services were contacted for further evaluation and management.   PAST MEDICAL HISTORY:  1.  Epilepsy.  2.  History of alcohol abuse.  3.  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  4.  Chronic renal failure.  5.  Dysphagia.  6.  Diabetes.  7.  Hypertension.   PAST SURGICAL HISTORY: Denies.   ALLERGIES: ACE INHIBITOR, LISINOPRIL AND NORVASC.   FAMILY HISTORY:  Hypertension and diabetes running in the family.   SOCIAL HISTORY: Currently lives at [facility]. No tobacco or drug use. He used to drink heavily but stopped drinking.   OUTPATIENT MEDICATIONS: Tylenol 650 mg every 4 hours as needed for fever or pain, allopurinol 100 mg daily, Cymbalta 30 mg daily, Depakote sprinkles 125 mg extended release 2 caps once a day at bedtime, Docusate 100 mg 2 times a day for constipation, folic acid 1 mg daily, guaifenesin [name], Keppra 250 mg 2 times a day, lacosamide 150 mg 2 times a day, Lantus 20 units once a day in the morning, Lasix 20 mg daily, memantine 5 mg  daily, sliding scale insulin, omeprazole 20 mg daily, sodium bicarb 650 mg 3 times a day for supplement.  REVIEW OF SYSTEMS:  CONSTITUTIONAL:  Unable to fully obtain as the patient has dementia, but the patient denies having any fevers, but was sweaty when he had shaking episodes. Denies blurry vision. Denies sore throat.  RESPIRATORY: Denies cough or shortness of breath.  CARDIOVASCULAR: Denies pain in the chest or shortness of breath.  GASTROINTESTINAL: Denies nausea, vomiting or abdominal pain. Has dysphagia.  GENITOURINARY: Denies dysuria.  HEMATOLOGIC AND LYMPHATIC: Denies anemia.  SKIN: Denies rashes.  MUSCULOSKELETAL: Has chronic right shoulder pain.  NEUROLOGIC:  Has history of seizures.  PSYCHIATRIC: Denies anxiety, but the patient is not very reliable, but at baseline per family.   PHYSICAL EXAMINATION: VITAL SIGNS: Temperature 97.7, pulse rate 77, respiratory rate 18, blood pressure 112/79, O2 sats 100% on room air.  GENERAL: The patient is a well-developed, elderly male sitting in bed. No obvious distress.  HEENT: Normocephalic, atraumatic. Pupils are equal and reactive. Extraocular muscles intact. Moist mucous membranes.  NECK: Supple. No thyroid tenderness. No cervical lymphadenopathy.  CARDIOVASCULAR: S1, S2 regular.  LUNGS: Clear to auscultation without wheezing, rhonchi or rales.  ABDOMEN: Soft, nontender, nondistended. Positive bowel sounds.  EXTREMITIES: No pitting edema.  NEUROLOGIC: Cranial nerves II through XII grossly intact. Strength is 5 out of 5 in all extremities, but the right upper extremity motion is limited secondary to what appears to be a frozen  shoulder.   SKIN: No obvious rashes.  PSYCHIATRIC: Awake, alert, oriented x 1.   LABORATORY DATA AND IMAGING: Glucose 182, BUN 40, creatinine 2.13, sodium 135, potassium 5.9. Magnesium is 1.3. White count of 5.4, hemoglobin 11.2, platelets 192. UA not suggestive of infection, and there is no EKG done.    ASSESSMENT AND PLAN: We have a pleasant 81 year old with moderate dementia per family, seizure disorder on several seizure medications, and CKD, who had seizures x 2 at his nephrologist's office. Furthermore, he has acute on chronic renal failure with hyperkalemia and hypomagnesemia. At this point, the patient has reverted back to baseline and is not postictal.   In regards to the seizures, I would go to check a Depakote level, continue Keppra and her  lacosamide. Of note, the patient had her dose of lacosamide increased recently. The seizures could have been secondary to electrolyte abnormalities including hypomagnesemia, and the patient has received magnesium already. Would monitor the patient and do frequent neuro checks and seizure precautions.   In regards to her acute on chronic renal failure, would hold the Lasix and start the patient on gentle fluids with normal saline. Furthermore, the patient has hyperkalemia which is likely secondary to worsening renal failure. Would hold the Lasix, give the patient some Kayexalate and some insulin and D50. Would continue the sodium bicarb.   In regards to the diabetes would continue the insulin, long acting, as well as order sliding scale insulin.   He does not appear to have any UTI symptoms or rashes or GI issues, and no respiratory issues to suggest he is having an infectious process as a precipitating factor for the seizure.  If the patient has more frequent seizures, we could obtain a neurology consult. Neurology had seen him in December, and per Dr. Sherryll Burger, who saw the patient, an EEG would likely not change management.   The patient is DNR.   TOTAL TIME SPENT: 50 minutes.     ____________________________ Krystal Eaton, MD sa:dmm D: 11/22/2013 19:55:49 ET T: 11/22/2013 20:23:41 ET JOB#: [id number]  cc: Krystal Eaton, MD, <Dictator> Serita Sheller. Maryellen Pile, MD Laverda Sorenson, MD Marcelle [facility] MD ELECTRONICALLY SIGNED 12/10/2013 13:08

## 2015-01-28 NOTE — H&P (Signed)
PATIENT NAME:  Phillip Osborne, Phillip Osborne MR#:  [id number] DATE OF BIRTH:  July 05, 1932  DATE OF ADMISSION:  04/03/2014  PRIMARY CARE PHYSICIAN: Dr. Maryellen PileEason.   HISTORY OF PRESENT ILLNESS: Abnormal labs at [facility].   The patient is a very pleasant 81 year old male with history of dementia, chronic kidney disease, epilepsy, dysphasia, and diabetes, who presents with the above complaint. The patient had some routine labs and apparently his potassium high so he was asked to come to the ER for further evaluation. In the ER he was noted to have a potassium of 6.1. He was given insulin, dextrose, calcium for his potassium.  We have a repeat potassium pending. He was also noted to have a creatinine of 3.11.   REVIEW OF SYSTEMS: CONSTITUTIONAL: No fever or fatigue. He has weakness.  EYES: No blurred or double vision.  EARS, NOSE, THROAT: No ear pain, positive hearing loss. No snoring, postnasal drip.  CHEST:  No cough,  SOB  CARDIAC: No chest pain or orthopnea, edema, arrhythmia, dyspnea on exertion, palpitations.  GASTROINTESTINAL: He has some minimal nausea. No vomiting, diarrhea, abdominal pain, melena or ulcers.   GENITOURINARY:  No dysuria, hematuria.  ENDOCRINE:  No polyuria, polydipsia. HEMATOLOGIC AND LYMPHATIC:  No anemia, easy bruising.  SKIN: No rash or lesions.  MUSCULOSKELETAL: No pain in shoulders.  NEUROLOGIC: Positive history of seizures. Positive history of dementia.  PSYCHIATRIC: No depression.   PAST MEDICAL HISTORY:  1.  Epilepsy.  2.  Alcohol abuse.  3.  Dementia, likely a combination of alcohol and vascular dementia.  4.  Chronic renal failure stage III.  5.  Dysphasia.   6.  Diabetes.  7.  Hypertension.   SURGICAL HISTORY:  None.   ALLERGIES:  ACE INHIBITORS, LISINOPRIL, NORVASC.  FAMILY HISTORY:  Positive for diabetes, hypertension.   SOCIAL HISTORY:  The patient is a resident at [facility]. No tobacco or drug use. He was a former alcoholic, stopped many  years ago.   MEDICATIONS:  We are obtaining the list currently.   PHYSICAL EXAMINATION:  VITAL SIGNS: Temperature is 97.5, pulse 81, respirations 18, blood pressure 137/90, oxygen saturation 100% on room air.  GENERAL:  The patient is alert, oriented, not in acute distress.  NECK:  Supple, no JVD, carotid bruit or enlarged thyroid.  HEENT:  Head is atraumatic, anicteric sclerae,, mucous membranes moist, oropharynx is clear.  CARDIOVASCULAR: Regular rate and rhythm. No murmurs, gallops, or rubs.  LUNGS: Clear to auscultation without crackles, rales, rhonchi or wheezing. Normal to percussion.  ABDOMEN: Bowel sounds are positive. Nontender, nondistended, no hepatosplenomegaly.   EXTREMITIES: No clubbing, cyanosis, or edema.  SKIN: Without rashes or lesions.  BACK: No CVA or vertebral tenderness.   LABORATORY DATA: Sodium 136, potassium 6.1, chloride 108, bicarbonate 21, BUN 56, creatinine 3.11, glucose 106, bilirubin 0.3, calcium 9.6, alkaline phosphatase 105, ALT 25, AST 12. Total protein 8.4, albumin 4.2, troponin less than 0.02.  White blood cells 4.9, hemoglobin 13, hematocrit 38, platelets are 198,000.   IMAGING: Chest x-ray shows no acute cardiopulmonary disease.   EKG:  No peaked T-waves. He has a nonspecific intraventricular block.   ASSESSMENT AND PLAN:  The patient is a 81 year old male who presented from his nursing home with abnormal labs with hyperkalemia.  1.  Hyperkalemia. No apparent EKG changes. The patient was treated for his hyperkalemia.  Suspect this is probably induced by his renal failure. Will repeat a potassium today and make sure it continues to be within normal limits.  2.  Acute renal failure, chronic kidney disease, stage III.  Nephrology has been consulted.  We will start IV fluids, monitor BMP.  Further recommendations as per nephrology.   3.  History of seizures. We will continue his outpatient medications once we have the medication list. 4.  Diabetes. The  patient is on sliding scale insulin for now. He may resume his outpatient medication dose once we have obtained this list.  5.  The patient is a DNR status.   TIME SPENT: Approximately 50 minutes.     ____________________________ Janyth Contes. Juliene Pina, MD spm:lt D: 04/03/2014 10:05:36 ET T: 04/03/2014 12:30:01 ET JOB#: [id number]  cc: Sital P. Juliene Pina, MD, <Dictator> Janyth Contes MODY MD ELECTRONICALLY SIGNED 04/03/2014 20:53

## 2015-01-29 NOTE — Op Note (Signed)
PATIENT NAME:  Phillip Osborne, Phillip Osborne MR#:  [id number] DATE OF BIRTH:  04-20-1932  DATE OF PROCEDURE:  10/14/2011  PREOPERATIVE DIAGNOSIS: Cataract, right eye.   POSTOPERATIVE DIAGNOSIS: Cataract, right eye.   PROCEDURE PERFORMED:  Extracapsular cataract extraction using phacoemulsification with placement of an Alcon SN6CWS, 21.0-diopter posterior chamber lens, serial # [id number].  SURGEON:  Maylon PeppersSteven A. Kynzleigh Bandel, MD  ASSISTANT:  None.  ANESTHESIA:  4% lidocaine and 0.75% Marcaine in a 50/50 mixture with 10 units/mL of Vitrase added, given as a peribulbar.   ANESTHESIOLOGIST:  Heriberto AntiguaScott Palmer, MD  COMPLICATIONS:  None.  ESTIMATED BLOOD LOSS:  Less than 1 ml.  DESCRIPTION OF PROCEDURE:  The patient was brought to the operating room and given a peribulbar block.  The patient was then prepped and draped in the usual fashion.  The vertical rectus muscles were imbricated using 5-0 silk sutures.  These sutures were then clamped to the sterile drapes as bridle sutures.  A limbal peritomy was performed extending two clock hours and hemostasis was obtained with cautery.  A partial thickness scleral groove was made at the surgical limbus and dissected anteriorly in a lamellar dissection using an Alcon crescent knife.  The anterior chamber was entered superonasally with a Superblade and through the lamellar dissection with a 2.6 mm keratome.  DisCoVisc was used to replace the aqueous and a continuous tear capsulorrhexis was carried out.  Hydrodissection and hydrodelineation were carried out with balanced salt and a 27 gauge canula.  The nucleus was rotated to confirm the effectiveness of the hydrodissection.  Phacoemulsification was carried out using a divide-and-conquer technique.  Total ultrasound time was 2 minutes and 6 seconds with an average power of 23.8 percent.  Irrigation/aspiration was used to remove the residual cortex.  DisCoVisc was used to inflate the capsule and the internal incision was  enlarged to 3 mm with the crescent knife.  The intraocular lens was folded and inserted into the capsular bag using the AcrySert delivery system. Irrigation/aspiration was used to remove the residual DisCoVisc.  Miostat was injected into the anterior chamber through the paracentesis track to inflate the anterior chamber and induce miosis.  The wound was checked for leaks and none were found. The conjunctiva was closed with cautery and the bridle sutures were removed.  Two drops of 0.3% Vigamox were placed on the eye.   An eye shield was placed on the eye.  The patient was discharged to the recovery room in good condition. ____________________________ Maylon PeppersSteven A. Luceil Herrin, MD sad:slb D: 10/14/2011 12:50:04 ET T: 10/14/2011 13:29:29 ET JOB#: [id number]  cc: Viviann SpareSteven A. Gillie Fleites, MD, <Dictator> Erline LevineSTEVEN A Dimitri Dsouza MD ELECTRONICALLY SIGNED 10/21/2011 13:50

## 2016-02-13 ENCOUNTER — Emergency Department: Payer: Medicare Other

## 2016-02-13 ENCOUNTER — Emergency Department
Admission: EM | Admit: 2016-02-13 | Discharge: 2016-02-13 | Disposition: A | Discharge status: Home / Self Care | Payer: Medicare Other | Attending: Emergency Medicine | Admitting: Emergency Medicine

## 2016-02-13 DIAGNOSIS — N200 Calculus of kidney: Secondary | ICD-10-CM | POA: Diagnosis not present

## 2016-02-13 DIAGNOSIS — R319 Hematuria, unspecified: Secondary | ICD-10-CM | POA: Diagnosis present

## 2016-02-13 DIAGNOSIS — E119 Type 2 diabetes mellitus without complications: Secondary | ICD-10-CM | POA: Insufficient documentation

## 2016-02-13 DIAGNOSIS — Z8669 Personal history of other diseases of the nervous system and sense organs: Secondary | ICD-10-CM | POA: Diagnosis not present

## 2016-02-13 DIAGNOSIS — I10 Essential (primary) hypertension: Secondary | ICD-10-CM | POA: Insufficient documentation

## 2016-02-13 HISTORY — DX: Essential (primary) hypertension: I10

## 2016-02-13 HISTORY — DX: Type 2 diabetes mellitus without complications: E11.9

## 2016-02-13 HISTORY — DX: Disorder of thyroid, unspecified: E07.9

## 2016-02-13 LAB — URINALYSIS COMPLETE WITH MICROSCOPIC (ARMC ONLY)
BILIRUBIN URINE: NEGATIVE
GLUCOSE, UA: NEGATIVE mg/dL
KETONES UR: NEGATIVE mg/dL
Nitrite: NEGATIVE
PROTEIN: 30 mg/dL — AB
SPECIFIC GRAVITY, URINE: 1.015 (ref 1.005–1.030)
pH: 5 (ref 5.0–8.0)

## 2016-02-13 LAB — BASIC METABOLIC PANEL
ANION GAP: 10 (ref 5–15)
BUN: 56 mg/dL — ABNORMAL HIGH (ref 6–20)
CHLORIDE: 110 mmol/L (ref 101–111)
CO2: 21 mmol/L — ABNORMAL LOW (ref 22–32)
CREATININE: 2.79 mg/dL — AB (ref 0.61–1.24)
Calcium: 8.9 mg/dL (ref 8.9–10.3)
GFR calc Af Amer: 23 mL/min — ABNORMAL LOW (ref >60)
GFR, EST NON AFRICAN AMERICAN: 20 mL/min — AB (ref >60)
Glucose, Bld: 108 mg/dL — ABNORMAL HIGH (ref 65–99)
POTASSIUM: 5.2 mmol/L — AB (ref 3.5–5.1)
Sodium: 141 mmol/L (ref 135–145)

## 2016-02-13 LAB — CBC
HEMATOCRIT: 33.3 % — AB (ref 40.0–52.0)
HEMOGLOBIN: 10.9 g/dL — AB (ref 13.0–18.0)
MCH: 29.5 pg (ref 26.0–34.0)
MCHC: 32.8 g/dL (ref 32.0–36.0)
MCV: 89.9 fL (ref 80.0–100.0)
Platelets: 146 10*3/uL — ABNORMAL LOW (ref 150–440)
RBC: 3.71 MIL/uL — ABNORMAL LOW (ref 4.40–5.90)
RDW: 13.9 % (ref 11.5–14.5)
WBC: 10.6 10*3/uL (ref 3.8–10.6)

## 2016-02-13 MED ORDER — CEPHALEXIN 500 MG PO CAPS: 500.0000 mg | ORAL_CAPSULE | Freq: Four times a day (QID) | ORAL | Status: DC

## 2016-02-13 MED ORDER — TAMSULOSIN HCL 0.4 MG PO CAPS: 0.4000 mg | ORAL_CAPSULE | Freq: Every day | ORAL | Status: DC

## 2016-02-13 NOTE — ED Notes (Signed)
Pt presents to ED via [facility] from home d/t c/o hematuria. Pt reports getting up to the BR around 130am, where he reports paniful ("burning") urination and hematuria. Pt denies any h/x of blood in his urine before, denies any h/x of kidney stones. Pt also denies any fevers, N/V/D. Pt also denies any signs of lightheadedness, dizziness, or feeling faint. Pt reports no use of prescription blood thinners. Pt is A&O, in NAD, with respirations even, regular, and unlabored. Pt does c/o bilateral knee pain, which he states is new. EMS reports VS as : BP 128/86; 95% O2 on RA; 100 HR; and CBG of 177

## 2016-02-13 NOTE — Discharge Instructions (Signed)
Hematuria, Adult °Hematuria is blood in your urine. It can be caused by a bladder infection, kidney infection, prostate infection, kidney stone, or cancer of your urinary tract. Infections can usually be treated with medicine, and a kidney stone usually will pass through your urine. If neither of these is the cause of your hematuria, further workup to find out the reason may be needed. °It is very important that you tell your health care provider about any blood you see in your urine, even if the blood stops without treatment or happens without causing pain. Blood in your urine that happens and then stops and then happens again can be a symptom of a very serious condition. Also, pain is not a symptom in the initial stages of many urinary cancers. °HOME CARE INSTRUCTIONS  °· Drink lots of fluid, 3-4 quarts a day. If you have been diagnosed with an infection, cranberry juice is especially recommended, in addition to large amounts of water. °· Avoid caffeine, tea, and carbonated beverages because they tend to irritate the bladder. °· Avoid alcohol because it may irritate the prostate. °· Take all medicines as directed by your health care provider. °· If you were prescribed an antibiotic medicine, finish it all even if you start to feel better. °· If you have been diagnosed with a kidney stone, follow your health care provider's instructions regarding straining your urine to catch the stone. °· Empty your bladder often. Avoid holding urine for long periods of time. °· After a bowel movement, women should cleanse front to back. Use each tissue only once. °· Empty your bladder before and after sexual intercourse if you are a male. °SEEK MEDICAL CARE IF: °· You develop back pain. °· You have a fever. °· You have a feeling of sickness in your stomach (nausea) or vomiting. °· Your symptoms are not better in 3 days. Return sooner if you are getting worse. °SEEK IMMEDIATE MEDICAL CARE IF:  °· You develop severe vomiting and  are unable to keep the medicine down. °· You develop severe back or abdominal pain despite taking your medicines. °· You begin passing a large amount of blood or clots in your urine. °· You feel extremely weak or faint, or you pass out. °MAKE SURE YOU:  °· Understand these instructions. °· Will watch your condition. °· Will get help right away if you are not doing well or get worse. °  °This information is not intended to replace advice given to you by your health care provider. Make sure you discuss any questions you have with your health care provider. °  °Document Released: 09/23/2005 Document Revised: 10/14/2014 Document Reviewed: 05/24/2013 °Elsevier Interactive Patient Education ©2016 [employer] ° °Kidney Stones °Kidney stones (urolithiasis) are deposits that form inside your kidneys. The intense pain is caused by the stone moving through the urinary tract. When the stone moves, the ureter goes into spasm around the stone. The stone is usually passed in the urine.  °CAUSES  °· A disorder that makes certain neck glands produce too much parathyroid hormone (primary hyperparathyroidism). °· A buildup of uric acid crystals, similar to gout in your joints. °· Narrowing (stricture) of the ureter. °· A kidney obstruction present at birth (congenital obstruction). °· Previous surgery on the kidney or ureters. °· Numerous kidney infections. °SYMPTOMS  °· Feeling sick to your stomach (nauseous). °· Throwing up (vomiting). °· Blood in the urine (hematuria). °· Pain that usually spreads (radiates) to the groin. °· Frequency or urgency of urination. °DIAGNOSIS  °·   Taking a history and physical exam. °· Blood or urine tests. °· CT scan. °· Occasionally, an examination of the inside of the urinary bladder (cystoscopy) is performed. °TREATMENT  °· Observation. °· Increasing your fluid intake. °· Extracorporeal shock wave lithotripsy--This is a noninvasive procedure that uses shock waves to break up kidney  stones. °· Surgery may be needed if you have severe pain or persistent obstruction. There are various surgical procedures. Most of the procedures are performed with the use of small instruments. Only small incisions are needed to accommodate these instruments, so recovery time is minimized. °The size, location, and chemical composition are all important variables that will determine the proper choice of action for you. Talk to your health care provider to better understand your situation so that you will minimize the risk of injury to yourself and your kidney.  °HOME CARE INSTRUCTIONS  °· Drink enough water and fluids to keep your urine clear or pale yellow. This will help you to pass the stone or stone fragments. °· Strain all urine through the provided strainer. Keep all particulate matter and stones for your health care provider to see. The stone causing the pain may be as small as a grain of salt. It is very important to use the strainer each and every time you pass your urine. The collection of your stone will allow your health care provider to analyze it and verify that a stone has actually passed. The stone analysis will often identify what you can do to reduce the incidence of recurrences. °· Only take over-the-counter or prescription medicines for pain, discomfort, or fever as directed by your health care provider. °· Keep all follow-up visits as told by your health care provider. This is important. °· Get follow-up X-rays if required. The absence of pain does not always mean that the stone has passed. It may have only stopped moving. If the urine remains completely obstructed, it can cause loss of kidney function or even complete destruction of the kidney. It is your responsibility to make sure X-rays and follow-ups are completed. Ultrasounds of the kidney can show blockages and the status of the kidney. Ultrasounds are not associated with any radiation and can be performed easily in a matter of  minutes. °· Make changes to your daily diet as told by your health care provider. You may be told to: °¨ Limit the amount of salt that you eat. °¨ Eat 5 or more servings of fruits and vegetables each day. °¨ Limit the amount of meat, poultry, fish, and eggs that you eat. °· Collect a 24-hour urine sample as told by your health care provider. You may need to collect another urine sample every 6-12 months. °SEEK MEDICAL CARE IF: °· You experience pain that is progressive and unresponsive to any pain medicine you have been prescribed. °SEEK IMMEDIATE MEDICAL CARE IF:  °· Pain cannot be controlled with the prescribed medicine. °· You have a fever or shaking chills. °· The severity or intensity of pain increases over 18 hours and is not relieved by pain medicine. °· You develop a new onset of abdominal pain. °· You feel faint or pass out. °· You are unable to urinate. °  °This information is not intended to replace advice given to you by your health care provider. Make sure you discuss any questions you have with your health care provider. °  °Document Released: 09/23/2005 Document Revised: 06/14/2015 Document Reviewed: 02/24/2013 °Elsevier Interactive Patient Education ©2016 [employer] ° °

## 2016-02-13 NOTE — ED Provider Notes (Signed)
[facility] Emergency Department Provider Note   ____________________________________________  Time seen: Approximately 319 AM  I have reviewed the triage vital signs and the nursing notes.   HISTORY  Chief Complaint Hematuria    HPI Phillip Osborne is a 82 y.o. male comes into the hospital today with hematuria. The patient reports that he urinated blood tonight. He woke up to use restroom and when he looked down and noticed that there was blood coming from his penis. The patient also has some burning with urination. He denies any abdominal pain but reports that he has had some back pain. He reports that he typically has some tightness in his back. He's never had a history of blood in his urine. He denies any nausea, vomiting, diarrhea. He felt well earlier today so was surprised when the symptoms started up tonight.The patient has some 8 out of 10 knee pain but reports that that is chronic.   Past Medical History  Diagnosis Date  . Hypertension   . Diabetes mellitus without complication (HCC)   . Thyroid disease     There are no active problems to display for this patient.   Past Surgical History  Procedure Laterality Date  . Hernia repair      Current Outpatient Rx  Name  Route  Sig  Dispense  Refill  . allopurinol (ZYLOPRIM) 100 MG tablet      TAKE 1 TABLET BY MOUTH EVERY DAY   30 tablet   0   . cephALEXin (KEFLEX) 500 MG capsule   Oral   Take 1 capsule (500 mg total) by mouth 4 (four) times daily.   20 capsule   0   . EXPIRED: levETIRAcetam (KEPPRA) 500 MG tablet   Oral   Take 1 tablet (500 mg total) by mouth every 12 (twelve) hours.   60 tablet   3   . metoprolol succinate (TOPROL-XL) 50 MG 24 hr tablet      TAKE 1 TABLET BY MOUTH ONCE A DAY. TAKE WITH OR IMMEDIATELY FOLLOWING A MEAL   90 tablet   3   . metoprolol succinate (TOPROL-XL) 50 MG 24 hr tablet      TAKE 1 TABLET BY MOUTH ONCE A DAY. TAKE WITH OR IMMEDIATELY  FOLLOWING A MEAL   90 tablet   3   . tamsulosin (FLOMAX) 0.4 MG CAPS capsule   Oral   Take 1 capsule (0.4 mg total) by mouth daily.   7 capsule   0   . TRUETRACK TEST test strip      TEST BLOOD SUGAR TWICE A DAY AS DIRECTED   100 each   2     Allergies Lisinopril  No family history on file.  Social History Social History  Substance Use Topics  . Smoking status: Never Smoker   . Smokeless tobacco: None  . Alcohol Use: No    Review of Systems Constitutional: No fever/chills Eyes: No visual changes. ENT: No sore throat. Cardiovascular: Denies chest pain. Respiratory: Denies shortness of breath. Gastrointestinal: No abdominal pain.  No nausea, no vomiting.  No diarrhea.  No constipation. Genitourinary: Hematuria Musculoskeletal: Bilateral knee pain. Skin: Negative for rash. Neurological: Negative for headaches, focal weakness or numbness.  10-point ROS otherwise negative.  ____________________________________________   PHYSICAL EXAM:  VITAL SIGNS: ED Triage Vitals  Enc Vitals Group     BP 02/13/16 0236 120/75 mmHg     Pulse Rate 02/13/16 0236 94     Resp 02/13/16 0236  14     Temp 02/13/16 0236 98.8 F (37.1 C)     Temp Source 02/13/16 0236 Oral     SpO2 02/13/16 0236 97 %     Weight 02/13/16 0236 164 lb (74.39 kg)     Height 02/13/16 0236 5\' 5"  (1.651 m)     Head Cir --      Peak Flow --      Pain Score 02/13/16 0237 8     Pain Loc --      Pain Edu? --      Excl. in GC? --     Constitutional: Alert and oriented. Well appearing and in no acute distress. Eyes: Conjunctivae are normal. PERRL. EOMI. Head: Atraumatic. Nose: No congestion/rhinnorhea. Mouth/Throat: Mucous membranes are moist.  Oropharynx non-erythematous. Cardiovascular: Normal rate, regular rhythm. Grossly normal heart sounds.  Good peripheral circulation. Respiratory: Normal respiratory effort.  No retractions. Lungs CTAB. Gastrointestinal: Soft and nontender. No distention.    Musculoskeletal: No lower extremity tenderness nor edema.  . Neurologic:  Normal speech and language.  Skin:  Skin is warm, dry and intact. Psychiatric: Mood and affect are normal.   ____________________________________________   LABS (all labs ordered are listed, but only abnormal results are displayed)  Labs Reviewed  URINALYSIS COMPLETEWITH MICROSCOPIC ([facility] ONLY) - Abnormal; Notable for the following:    Color, Urine YELLOW (*)    APPearance HAZY (*)    Hgb urine dipstick 3+ (*)    Protein, ur 30 (*)    Leukocytes, UA 2+ (*)    Bacteria, UA RARE (*)    Squamous Epithelial / LPF 0-5 (*)    All other components within normal limits  CBC - Abnormal; Notable for the following:    RBC 3.71 (*)    Hemoglobin 10.9 (*)    HCT 33.3 (*)    Platelets 146 (*)    All other components within normal limits  BASIC METABOLIC PANEL - Abnormal; Notable for the following:    Potassium 5.2 (*)    CO2 21 (*)    Glucose, Bld 108 (*)    BUN 56 (*)    Creatinine, Ser 2.79 (*)    GFR calc non Af Amer 20 (*)    GFR calc Af Amer 23 (*)    All other components within normal limits   ____________________________________________  EKG  none ____________________________________________  RADIOLOGY  Ct renal stone study: 5 x 7 mm left UVJ calculus, chronic, hiatal hernia, gastric diverticulum. ____________________________________________   PROCEDURES  Procedure(s) performed: None  Critical Care performed: No  ____________________________________________   INITIAL IMPRESSION / ASSESSMENT AND PLAN / ED COURSE  Pertinent labs & imaging results that were available during my care of the patient were reviewed by me and considered in my medical decision making (see chart for details).  This is an 82 yo M who comes into the hospital with hematuria. It appears that the patient has left UVJ calculus. He was not having significant pain. As I am unsure the chronicity of this stone I will give  him a prescription for Flomax as well as a prescription for Keflex. I discussed this with the family and reports that he need to follow up with urology. The patient will be discharged to home so that he may follow-up. ____________________________________________   FINAL CLINICAL IMPRESSION(S) / ED DIAGNOSES  Final diagnoses:  Hematuria  Kidney stone      NEW MEDICATIONS STARTED DURING THIS VISIT:  New Prescriptions   CEPHALEXIN (KEFLEX) 500  MG CAPSULE    Take 1 capsule (500 mg total) by mouth 4 (four) times daily.   TAMSULOSIN (FLOMAX) 0.4 MG CAPS CAPSULE    Take 1 capsule (0.4 mg total) by mouth daily.     Note:  This document was prepared using Dragon voice recognition software and may include unintentional dictation errors.    Rebecka ApleyAllison P Sahira Cataldi, MD 02/13/16 [phone]

## 2016-02-14 ENCOUNTER — Encounter: Payer: Self-pay | Admitting: *Deleted

## 2016-02-14 ENCOUNTER — Emergency Department
Admission: EM | Admit: 2016-02-14 | Discharge: 2016-02-14 | Disposition: A | Discharge status: Home / Self Care | Payer: Medicare Other | Attending: Student | Admitting: Student

## 2016-02-14 DIAGNOSIS — Z794 Long term (current) use of insulin: Secondary | ICD-10-CM | POA: Insufficient documentation

## 2016-02-14 DIAGNOSIS — E079 Disorder of thyroid, unspecified: Secondary | ICD-10-CM | POA: Insufficient documentation

## 2016-02-14 DIAGNOSIS — Z79899 Other long term (current) drug therapy: Secondary | ICD-10-CM | POA: Insufficient documentation

## 2016-02-14 DIAGNOSIS — E119 Type 2 diabetes mellitus without complications: Secondary | ICD-10-CM | POA: Diagnosis not present

## 2016-02-14 DIAGNOSIS — G40909 Epilepsy, unspecified, not intractable, without status epilepticus: Secondary | ICD-10-CM | POA: Insufficient documentation

## 2016-02-14 DIAGNOSIS — I10 Essential (primary) hypertension: Secondary | ICD-10-CM | POA: Insufficient documentation

## 2016-02-14 HISTORY — DX: Unspecified convulsions: R56.9

## 2016-02-14 LAB — BASIC METABOLIC PANEL
Anion gap: 7 (ref 5–15)
BUN: 62 mg/dL — ABNORMAL HIGH (ref 6–20)
CO2: 18 mmol/L — AB (ref 22–32)
Calcium: 8.1 mg/dL — ABNORMAL LOW (ref 8.9–10.3)
Chloride: 110 mmol/L (ref 101–111)
Creatinine, Ser: 3.12 mg/dL — ABNORMAL HIGH (ref 0.61–1.24)
GFR calc non Af Amer: 17 mL/min — ABNORMAL LOW (ref >60)
GFR, EST AFRICAN AMERICAN: 20 mL/min — AB (ref >60)
Glucose, Bld: 244 mg/dL — ABNORMAL HIGH (ref 65–99)
POTASSIUM: 5.4 mmol/L — AB (ref 3.5–5.1)
SODIUM: 135 mmol/L (ref 135–145)

## 2016-02-14 LAB — CBC WITH DIFFERENTIAL/PLATELET
Basophils Absolute: 0 10*3/uL (ref 0–0.1)
Basophils Relative: 0 %
EOS ABS: 0.1 10*3/uL (ref 0–0.7)
Eosinophils Relative: 0 %
HEMATOCRIT: 30.5 % — AB (ref 40.0–52.0)
Hemoglobin: 10 g/dL — ABNORMAL LOW (ref 13.0–18.0)
LYMPHS ABS: 0.5 10*3/uL — AB (ref 1.0–3.6)
MCH: 29.6 pg (ref 26.0–34.0)
MCHC: 32.6 g/dL (ref 32.0–36.0)
MCV: 90.7 fL (ref 80.0–100.0)
Monocytes Absolute: 1.2 10*3/uL — ABNORMAL HIGH (ref 0.2–1.0)
NEUTROS ABS: 11 10*3/uL — AB (ref 1.4–6.5)
Neutrophils Relative %: 86 %
Platelets: 131 10*3/uL — ABNORMAL LOW (ref 150–440)
RBC: 3.37 MIL/uL — AB (ref 4.40–5.90)
RDW: 14.3 % (ref 11.5–14.5)
WBC: 12.8 10*3/uL — AB (ref 3.8–10.6)

## 2016-02-14 LAB — URINALYSIS COMPLETE WITH MICROSCOPIC (ARMC ONLY)
BILIRUBIN URINE: NEGATIVE
GLUCOSE, UA: NEGATIVE mg/dL
Ketones, ur: NEGATIVE mg/dL
NITRITE: NEGATIVE
Protein, ur: NEGATIVE mg/dL
Specific Gravity, Urine: 1.009 (ref 1.005–1.030)
pH: 5 (ref 5.0–8.0)

## 2016-02-14 LAB — COMPREHENSIVE METABOLIC PANEL
ALK PHOS: 40 U/L (ref 38–126)
ALT: 12 U/L — AB (ref 17–63)
ANION GAP: 9 (ref 5–15)
AST: 18 U/L (ref 15–41)
Albumin: 3.6 g/dL (ref 3.5–5.0)
BILIRUBIN TOTAL: 0.6 mg/dL (ref 0.3–1.2)
BUN: 63 mg/dL — ABNORMAL HIGH (ref 6–20)
CALCIUM: 8.7 mg/dL — AB (ref 8.9–10.3)
CO2: 19 mmol/L — ABNORMAL LOW (ref 22–32)
CREATININE: 3.31 mg/dL — AB (ref 0.61–1.24)
Chloride: 109 mmol/L (ref 101–111)
GFR calc non Af Amer: 16 mL/min — ABNORMAL LOW (ref >60)
GFR, EST AFRICAN AMERICAN: 19 mL/min — AB (ref >60)
GLUCOSE: 172 mg/dL — AB (ref 65–99)
Potassium: 5.5 mmol/L — ABNORMAL HIGH (ref 3.5–5.1)
SODIUM: 137 mmol/L (ref 135–145)
TOTAL PROTEIN: 6.7 g/dL (ref 6.5–8.1)

## 2016-02-14 LAB — VALPROIC ACID LEVEL: Valproic Acid Lvl: 91 ug/mL (ref 50.0–100.0)

## 2016-02-14 MED ORDER — SODIUM CHLORIDE 0.9 % IV BOLUS (SEPSIS)
1000.0000 mL | Freq: Once | INTRAVENOUS | Status: AC
Start: 2016-02-14 — End: 2016-02-14
  Administered 2016-02-14: 1000 mL via INTRAVENOUS

## 2016-02-14 NOTE — ED Notes (Signed)
Assisted pt with urinal

## 2016-02-14 NOTE — ED Notes (Signed)
Patient took home meds per Dr. Joya SalmGayle's approval.

## 2016-02-14 NOTE — ED Provider Notes (Addendum)
[facility] Emergency Department Provider Note   ____________________________________________  Time seen: Approximately 11:14 AM  I have reviewed the triage vital signs and the nursing notes.   HISTORY  Chief Complaint Seizures    HPI Phillip Osborne is a 82 y.o. male history of hypertension, diabetes, seizure disorder, mild dementia, thyroid disease who presents for evaluation of 2 witnessed seizures today, sudden onset, now resolved, severe, no modifying factors. According to the patient's family, he was witnessed to go to the bathroom this morning and he had a seizure on the toilet, he fell onto the floor. According to his girlfriend, he may have had up to 2 seizures. He reports compliance with all of his medications. He was seen in the emergency department this morning for hematuria, diagnosed with a kidney stone and started on antibiotics. He reports that he is not having any flank pain at this time. No fevers or chills. No vomiting or diarrhea. No chest pain or trouble breathing. He has not had a seizure in almost one year.   Past Medical History  Diagnosis Date  . Hypertension   . Diabetes mellitus without complication (HCC)   . Thyroid disease   . Seizures (HCC)     There are no active problems to display for this patient.   Past Surgical History  Procedure Laterality Date  . Hernia repair      Current Outpatient Rx  Name  Route  Sig  Dispense  Refill  . cephALEXin (KEFLEX) 500 MG capsule   Oral   Take 1 capsule (500 mg total) by mouth 4 (four) times daily.   20 capsule   0   . divalproex (DEPAKOTE SPRINKLE) 125 MG capsule   Oral   Take 250 mg by mouth at bedtime.         . donepezil (ARICEPT) 5 MG tablet   Oral   Take 5 mg by mouth at bedtime.         . febuxostat (ULORIC) 40 MG tablet   Oral   Take 40 mg by mouth daily.         . ferrous sulfate 325 (65 FE) MG tablet   Oral   Take 325 mg by mouth daily.           . folic acid (FOLVITE) 1 MG tablet   Oral   Take 1 mg by mouth daily.         . furosemide (LASIX) 20 MG tablet   Oral   Take 20 mg by mouth every morning.         . insulin glargine (LANTUS) 100 UNIT/ML injection   Subcutaneous   Inject 45 Units into the skin at bedtime.         . insulin lispro (HUMALOG) 100 UNIT/ML injection   Subcutaneous   Inject 5 Units into the skin 3 (three) times daily before meals.         Marland Kitchen levothyroxine (SYNTHROID, LEVOTHROID) 25 MCG tablet   Oral   Take 50 mcg by mouth every Monday, Wednesday, and Friday. Alternating with 25 mcg         . levothyroxine (SYNTHROID, LEVOTHROID) 25 MCG tablet   Oral   Take 25 mcg by mouth every Tuesday, Thursday, Saturday, and Sunday. Alternating with 50 mcg         . magnesium oxide (MAG-OX) 400 MG tablet   Oral   Take 400 mg by mouth daily.         Marland Kitchen  tamsulosin (FLOMAX) 0.4 MG CAPS capsule   Oral   Take 1 capsule (0.4 mg total) by mouth daily.   7 capsule   0   . Valproic Acid 250 MG CPDR   Oral   Take 250 mg by mouth 2 (two) times daily.           Allergies Lisinopril  History reviewed. No pertinent family history.  Social History Social History  Substance Use Topics  . Smoking status: Never Smoker   . Smokeless tobacco: None  . Alcohol Use: No    Review of Systems Constitutional: No fever/chills Eyes: No visual changes. ENT: No sore throat. Cardiovascular: Denies chest pain. Respiratory: Denies shortness of breath. Gastrointestinal: No abdominal pain.  No nausea, no vomiting.  No diarrhea.  No constipation. Genitourinary: Positive for hematuria. Musculoskeletal: Negative for back pain. Skin: Negative for rash. Neurological: Negative for headaches, focal weakness or numbness.  10-point ROS otherwise negative.  ____________________________________________   PHYSICAL EXAM:  Filed Vitals:   02/14/16 1158 02/14/16 1210 02/14/16 1401 02/14/16 1425  BP: 102/63 105/63  110/65 114/68  Pulse: 80 79 75 76  Temp:      TempSrc:      Resp: 22 20 18 20   Height:      Weight:      SpO2: 100% 98% 100% 100%    VITAL SIGNS: ED Triage Vitals  Enc Vitals Group     BP 02/14/16 0911 126/64 mmHg     Pulse Rate 02/14/16 0911 82     Resp 02/14/16 0911 18     Temp 02/14/16 0911 98.6 F (37 C)     Temp Source 02/14/16 0911 Oral     SpO2 02/14/16 0911 98 %     Weight 02/14/16 0911 164 lb (74.39 kg)     Height 02/14/16 0911 5\' 5"  (1.651 m)     Head Cir --      Peak Flow --      Pain Score 02/14/16 0912 5     Pain Loc --      Pain Edu? --      Excl. in GC? --     Constitutional: Alert and oriented. Well appearing and in no acute distress. Eyes: Conjunctivae are normal. PERRL. EOMI. Head: Atraumatic. Nose: No congestion/rhinnorhea. Mouth/Throat: Mucous membranes are moist.  Oropharynx non-erythematous. Neck: No stridor.  No cervical spine tenderness to palpation. Cardiovascular: Normal rate, regular rhythm. Grossly normal heart sounds.  Good peripheral circulation. Respiratory: Normal respiratory effort.  No retractions. Lungs CTAB. Gastrointestinal: Soft and nontender. No distention. No CVA tenderness. Genitourinary: deferred Musculoskeletal: No lower extremity tenderness nor edema.  No joint effusions. Neurologic:  Normal speech and language. No gross focal neurologic deficits are appreciated. No gait instability. 5 out of 5 strength bilateral upper and lower extremity, sensation intact to light touch throughout, cranial nerves II through XII intact. Skin:  Skin is warm, dry and intact. No rash noted. Psychiatric: Mood and affect are normal. Speech and behavior are normal.  ____________________________________________   LABS (all labs ordered are listed, but only abnormal results are displayed)  Labs Reviewed  CBC WITH DIFFERENTIAL/PLATELET - Abnormal; Notable for the following:    WBC 12.8 (*)    RBC 3.37 (*)    Hemoglobin 10.0 (*)    HCT 30.5 (*)     Platelets 131 (*)    Neutro Abs 11.0 (*)    Lymphs Abs 0.5 (*)    Monocytes Absolute 1.2 (*)    All  other components within normal limits  COMPREHENSIVE METABOLIC PANEL - Abnormal; Notable for the following:    Potassium 5.5 (*)    CO2 19 (*)    Glucose, Bld 172 (*)    BUN 63 (*)    Creatinine, Ser 3.31 (*)    Calcium 8.7 (*)    ALT 12 (*)    GFR calc non Af Amer 16 (*)    GFR calc Af Amer 19 (*)    All other components within normal limits  URINALYSIS COMPLETEWITH MICROSCOPIC ([facility] ONLY) - Abnormal; Notable for the following:    Color, Urine YELLOW (*)    APPearance CLOUDY (*)    Hgb urine dipstick 3+ (*)    Leukocytes, UA 2+ (*)    Bacteria, UA RARE (*)    Squamous Epithelial / LPF 6-30 (*)    All other components within normal limits  BASIC METABOLIC PANEL - Abnormal; Notable for the following:    Potassium 5.4 (*)    CO2 18 (*)    Glucose, Bld 244 (*)    BUN 62 (*)    Creatinine, Ser 3.12 (*)    Calcium 8.1 (*)    GFR calc non Af Amer 17 (*)    GFR calc Af Amer 20 (*)    All other components within normal limits  VALPROIC ACID LEVEL  CBG MONITORING, ED   ____________________________________________  EKG  ED ECG REPORT I, Gayla Doss, the attending physician, personally viewed and interpreted this ECG.   Date: 02/14/2016  EKG Time: 09:12  Rate: 83  Rhythm: normal sinus rhythm  Axis: normal  Intervals:none  ST&T Change: LVH with repolarization abnormality. No acute ST elevation. ST depression noted in inferior and lateral leads however this was apparent on prior EKG from 04/03/14.  ____________________________________________  RADIOLOGY  none ____________________________________________   PROCEDURES  Procedure(s) performed: None  Critical Care performed: No  ____________________________________________   INITIAL IMPRESSION / ASSESSMENT AND PLAN / ED COURSE  Pertinent labs & imaging results that were available during my care of the patient  were reviewed by me and considered in my medical decision making (see chart for details).  ARDIS FULLWOOD is a 82 y.o. male history of hypertension, diabetes, seizure disorder, mild dementia, thyroid disease who presents for evaluation of 2 witnessed seizures today. On exam, he is very well-appearing and in no acute distress. Vital signs stable, he is afebrile. His exam is atraumatic, he has an intact neurological examination. We'll obtain screening labs, check a Depakote level, give his home dose of medications and observe in the emergency department. Reassess for disposition.  ----------------------------------------- 2:32 PM on 02/14/2016 -----------------------------------------  The patient has been observed for almost 5 and half hours in the emergency department without any recurrence of seizure activity. He sitting up in bed eating a sandwich, watching television, has no complaints. I reviewed his labs, his white blood cell count is notable for mild leukocytosis, urinalysis is unchanged from yesterday and he is on Keflex. Initial CMP showed potassium 5.5, creatinine of 3.31. Repeat after 1 L of normal saline shows potassium 5.4, creatinine 3.12 and his baseline creatinine appears to be closer to 2.95. I discussed this with his kidney doctor, Dr. Wynelle Link who reports that both creatinine and potassium have been higher previously. Dr. Wynelle Link will help to arrange close expedient follow-up for the patient. He has no acute hyperkalemic EKG changes and appears well. He has a therapeutic Depakote level. We discussed return precautions, need for close nephrology  and neurology follow-up and he is comfortable with the discharge plan. DC home. ____________________________________________   FINAL CLINICAL IMPRESSION(S) / ED DIAGNOSES  Final diagnoses:  Seizure disorder (HCC)      NEW MEDICATIONS STARTED DURING THIS VISIT:  New Prescriptions   No medications on file     Note:  This document  was prepared using Dragon voice recognition software and may include unintentional dictation errors.    Gayla Doss, MD 02/14/16 1435  Gayla Doss, MD 02/14/16 [phone]

## 2016-02-14 NOTE — ED Notes (Signed)
Seizure pads placed around pt.  

## 2016-02-14 NOTE — ED Notes (Signed)
States 2 seizures this AM witnessed by girlfriend, hx of seizures, pt was seen in Ed yesterday for kidney stone and started on abx

## 2016-02-14 NOTE — ED Notes (Signed)
Pt in bed eating and watching tv.

## 2016-02-20 ENCOUNTER — Ambulatory Visit
Admission: RE | Admit: 2016-02-20 | Discharge: 2016-02-20 | Disposition: A | Discharge status: Home / Self Care | Payer: Medicare Other | Source: Ambulatory Visit | Attending: Urology | Admitting: Urology

## 2016-02-20 ENCOUNTER — Encounter: Payer: Self-pay | Admitting: Urology

## 2016-02-20 ENCOUNTER — Ambulatory Visit (INDEPENDENT_AMBULATORY_CARE_PROVIDER_SITE_OTHER): Payer: Medicare Other | Admitting: Urology

## 2016-02-20 VITALS — BP 120/61 | HR 82 | Ht 65.0 in | Wt 165.6 lb

## 2016-02-20 DIAGNOSIS — N201 Calculus of ureter: Secondary | ICD-10-CM

## 2016-02-20 DIAGNOSIS — R310 Gross hematuria: Secondary | ICD-10-CM | POA: Diagnosis not present

## 2016-02-20 LAB — URINALYSIS, COMPLETE
BILIRUBIN UA: NEGATIVE
Glucose, UA: NEGATIVE
Ketones, UA: NEGATIVE
LEUKOCYTES UA: NEGATIVE
Nitrite, UA: NEGATIVE
PROTEIN UA: NEGATIVE
RBC UA: NEGATIVE
Specific Gravity, UA: 1.01 (ref 1.005–1.030)
Urobilinogen, Ur: 0.2 mg/dL (ref 0.2–1.0)
pH, UA: 5 (ref 5.0–7.5)

## 2016-02-20 LAB — MICROSCOPIC EXAMINATION
RBC, UA: NONE SEEN /hpf (ref 0–?)
WBC, UA: NONE SEEN /hpf (ref 0–?)

## 2016-02-20 NOTE — Progress Notes (Signed)
02/20/2016 9:59 PM   Phillip Osborne 05/02/1932 [id number]  Referring provider: [facility] [place], [place] [address]  Chief Complaint  Patient presents with  . Nephrolithiasis    referred by ER    HPI: Patient is an 82 year old African-American male who presents today as a referral from [facility] emergency department for nephrolithiasis.  His daughter, Phillip Osborne, is present with him as he is experiencing mild dementia.  Patient stated that he had the sudden onset of gross hematuria.  He also had the associated left flank pain that radiated to the waist and then into the penis. It startled him so much that he called 911 and was taken to the emergency department.  A CT renal scan was performed in the emergency room on 02/14/2016 and a 5 x 7 mm left UVJ stone was found.  He was then discharged to home and instructed to follow up with urology.    He has baseline urinary symptoms of urgency, dysuria and nocturia.  His UA today is unremarkable.    He does not recall having a prior episode of nephrolithiasis.  I personally reviewed the CT images with the patient. I personally reviewed the images of the KUB.  PMH: Past Medical History  Diagnosis Date  . Hypertension   . Diabetes mellitus without complication (Phillip Osborne)   . Thyroid disease   . Seizures ([place])   . Kidney stone     Surgical History: Past Surgical History  Procedure Laterality Date  . Hernia repair  70's    Home Medications:    Medication List       This list is accurate as of: 02/20/16  9:59 PM.  Always use your most recent med list.               calcitRIOL 0.25 MCG capsule  Commonly known as:  ROCALTROL  Take by mouth.     cephALEXin 500 MG capsule  Commonly known as:  KEFLEX  Take 1 capsule (500 mg total) by mouth 4 (four) times daily.     divalproex 125 MG capsule  Commonly known as:  DEPAKOTE SPRINKLE  Take 250 mg by mouth at bedtime.     donepezil 5 MG tablet    Commonly known as:  ARICEPT  Take 5 mg by mouth at bedtime.     febuxostat 40 MG tablet  Commonly known as:  ULORIC  Take 40 mg by mouth daily.     ferrous sulfate 325 (65 FE) MG tablet  Take 325 mg by mouth daily.     folic acid 1 MG tablet  Commonly known as:  FOLVITE  Take 1 mg by mouth daily.     furosemide 20 MG tablet  Commonly known as:  LASIX  Take 20 mg by mouth every morning.     insulin glargine 100 UNIT/ML injection  Commonly known as:  LANTUS  Inject 45 Units into the skin at bedtime.     insulin lispro 100 UNIT/ML injection  Commonly known as:  HUMALOG  Inject 5 Units into the skin 3 (three) times daily before meals.     levothyroxine 25 MCG tablet  Commonly known as:  SYNTHROID, LEVOTHROID  Take 50 mcg by mouth every Monday, Wednesday, and Friday. Alternating with 25 mcg     levothyroxine 25 MCG tablet  Commonly known as:  SYNTHROID, LEVOTHROID  Take 25 mcg by mouth every Tuesday, Thursday, Saturday, and Sunday. Alternating with 50 mcg  magnesium oxide 400 MG tablet  Commonly known as:  MAG-OX  Take 400 mg by mouth daily.     tamsulosin 0.4 MG Caps capsule  Commonly known as:  FLOMAX  Take 1 capsule (0.4 mg total) by mouth daily.     Valproic Acid 250 MG Cpdr  Take 250 mg by mouth 2 (two) times daily.        Allergies:  Allergies  Allergen Reactions  . Enalapril Swelling    Face swelling.  . Lisinopril Swelling    Family History: Family History  Problem Relation Age of Onset  . Kidney disease Neg Hx   . Prostate cancer Neg Hx     Social History:  reports that he has never smoked. He does not have any smokeless tobacco history on file. He reports that he does not drink alcohol or use illicit drugs.  ROS: UROLOGY Frequent Urination?: No Hard to postpone urination?: Yes Burning/pain with urination?: Yes Get up at night to urinate?: Yes Leakage of urine?: No Urine stream starts and stops?: No Trouble starting stream?: No Do you  have to strain to urinate?: No Blood in urine?: Yes Urinary tract infection?: Yes Sexually transmitted disease?: No Injury to kidneys or bladder?: No Painful intercourse?: No Weak stream?: No Erection problems?: Yes Penile pain?: No  Gastrointestinal Nausea?: No Vomiting?: No Indigestion/heartburn?: No Diarrhea?: No Constipation?: No  Constitutional Fever: No Night sweats?: No Weight loss?: No Fatigue?: Yes  Skin Skin rash/lesions?: No Itching?: No  Eyes Blurred vision?: No Double vision?: No  Ears/Nose/Throat Sore throat?: No Sinus problems?: No  Hematologic/Lymphatic Swollen glands?: No Easy bruising?: No  Cardiovascular Leg swelling?: No Chest pain?: No  Respiratory Cough?: Yes Shortness of breath?: Yes  Endocrine Excessive thirst?: No  Musculoskeletal Back pain?: Yes Joint pain?: Yes  Neurological Headaches?: No Dizziness?: No  Psychologic Depression?: No Anxiety?: No  Physical Exam: BP 120/61 mmHg  Pulse 82  Ht '5\' 5"'$  (1.651 m)  Wt 165 lb 9.6 oz (75.116 kg)  BMI 27.56 kg/m2  Constitutional: Well nourished. Alert and oriented, No acute distress. HEENT: [place] AT, moist mucus membranes. Trachea midline, no masses. Cardiovascular: No clubbing, cyanosis, or edema. Respiratory: Normal respiratory effort, no increased work of breathing. GI: Abdomen is soft, non tender, non distended, no abdominal masses. Liver and spleen not palpable.  No hernias appreciated.  Stool sample for occult testing is not indicated.   GU: No CVA tenderness.  No bladder fullness or masses.   Skin: No rashes, bruises or suspicious lesions. Lymph: No cervical or inguinal adenopathy. Neurologic: Grossly intact, no focal deficits, moving all 4 extremities. Psychiatric: Normal mood and affect.  Laboratory Data: Lab Results  Component Value Date   WBC 12.8* 02/14/2016   HGB 10.0* 02/14/2016   HCT 30.5* 02/14/2016   MCV 90.7 02/14/2016   PLT 131* 02/14/2016    Lab  Results  Component Value Date   CREATININE 3.12* 02/14/2016    Lab Results  Component Value Date   HGBA1C 7.2* 11/23/2013    Lab Results  Component Value Date   TSH 4.84* 11/23/2013       Component Value Date/Time   CHOL 121 05/10/2013 0415   HDL 60 05/10/2013 0415   VLDL 24 05/10/2013 0415   LDLCALC 37 05/10/2013 0415    Lab Results  Component Value Date   AST 18 02/14/2016   Lab Results  Component Value Date   ALT 12* 02/14/2016    Urinalysis Results for orders placed or performed  in visit on 02/20/16  Microscopic Examination  Result Value Ref Range   WBC, UA None seen 0 -  5 /hpf   RBC, UA None seen 0 -  2 /hpf   Epithelial Cells (non renal) 0-10 0 - 10 /hpf   Bacteria, UA Few (A) None seen/Few  Urinalysis, Complete  Result Value Ref Range   Specific Gravity, UA 1.010 1.005 - 1.030   pH, UA 5.0 5.0 - 7.5   Color, UA Yellow Yellow   Appearance Ur Clear Clear   Leukocytes, UA Negative Negative   Protein, UA Negative Negative/Trace   Glucose, UA Negative Negative   Ketones, UA Negative Negative   RBC, UA Negative Negative   Bilirubin, UA Negative Negative   Urobilinogen, Ur 0.2 0.2 - 1.0 mg/dL   Nitrite, UA Negative Negative   Microscopic Examination See below:     Pertinent Imaging: CLINICAL DATA: Hematuria.  EXAM: CT ABDOMEN AND PELVIS WITHOUT CONTRAST  TECHNIQUE: Multidetector CT imaging of the abdomen and pelvis was performed following the standard protocol without IV contrast.  COMPARISON: 05/09/2013  FINDINGS: There is a 5 x 7 mm calculus in the distal left ureter at the UPJ. This is chronic, with no significant ureterectasis or hydronephrosis and it appears to be unchanged from 05/09/2013.  Hypodense renal parenchymal lesions are present bilaterally, likely cysts but not conclusively characterized in the absence of intravenous contrast.  There are unremarkable unenhanced appearances of the liver, gallbladder, bile ducts,  spleen, pancreas and adrenals. There is a gastric diverticulum which simulates a left adrenal nodule. The stomach is otherwise remarkable only for a hiatal hernia. Small bowel and colon are unremarkable. Appendix is normal. Abdominal aorta is normal in caliber with mild atherosclerotic calcification.  No significant abnormalities evident in the lower chest. No significant musculoskeletal abnormality is evident.  IMPRESSION: 1. 5 x 7 mm left UVJ calculus, chronic. 2. Hiatal hernia. 3. 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   Electronically Signed  By: Andreas Newport M.D.  On: 02/13/2016 05:10        CLINICAL DATA: History of distal left ureteral stone. Flank pain.  EXAM: ABDOMEN - 1 VIEW  COMPARISON: 02/13/2016 unenhanced CT abdomen/pelvis.  FINDINGS: Stable 6 mm left ureterovesical junction stone. Additional tiny calcifications in the bilateral deep pelvis are unchanged and represent calcified venous phleboliths as seen on the recent CT study. No radiopaque stones overlie the kidneys. No dilated small bowel loops. Physiologic stool in the colon. No evidence of pneumatosis or pneumoperitoneum. Mild-to-moderate degenerative changes in the lumbar spine.  IMPRESSION: Stable 6 mm left UVJ stone.   Electronically Signed  By: Ilona Sorrel M.D.  On: 02/20/2016 13:53   Assessment & Plan:   Patient has decided to undergo ESWL for definitive treatment of his left UVJ stone.  1. Left UVJ stone:   We discussed MET versus ESWL versus URS/LL/ureteral stent placement as treatment options for his stone. He has decided to undergo left ESWL.  I did explain to the patient that undergoing ESWL did not guarantee he would not have to undergo URS/LL/stent placement. I stated that the ESWL may not be successful in fragmenting the stones due to the position and size.   - Urinalysis, Complete - CULTURE, URINE COMPREHENSIVE  2. Gross hematuria:   We will  continue to monitor the patient's UA after the treatment/passage of the stone to ensure the hematuria has resolved.  If hematuria persists, we will pursue a hematuria workup with CT Urogram and cystoscopy if  appropriate.  Return for left ESWL.  These notes generated with voice recognition software. I apologize for typographical errors.  Zara Council, [facility] [address] [place], [place] [address] [phone]

## 2016-02-21 ENCOUNTER — Encounter
Admission: RE | Admit: 2016-02-21 | Discharge: 2016-02-21 | Disposition: A | Discharge status: Home / Self Care | Payer: Medicare Other | Source: Ambulatory Visit | Attending: Urology | Admitting: Urology

## 2016-02-21 DIAGNOSIS — G40909 Epilepsy, unspecified, not intractable, without status epilepticus: Secondary | ICD-10-CM | POA: Diagnosis not present

## 2016-02-21 DIAGNOSIS — I10 Essential (primary) hypertension: Secondary | ICD-10-CM | POA: Diagnosis not present

## 2016-02-21 DIAGNOSIS — E119 Type 2 diabetes mellitus without complications: Secondary | ICD-10-CM | POA: Diagnosis not present

## 2016-02-21 DIAGNOSIS — N201 Calculus of ureter: Secondary | ICD-10-CM | POA: Diagnosis not present

## 2016-02-22 ENCOUNTER — Ambulatory Visit
Admission: RE | Admit: 2016-02-22 | Discharge: 2016-02-22 | Disposition: A | Discharge status: Home / Self Care | Payer: Medicare Other | Source: Ambulatory Visit | Attending: Urology | Admitting: Urology

## 2016-02-22 ENCOUNTER — Other Ambulatory Visit: Payer: Self-pay | Admitting: Urology

## 2016-02-22 ENCOUNTER — Encounter
Admission: RE | Disposition: A | Discharge status: Home / Self Care | Payer: Self-pay | Source: Ambulatory Visit | Attending: Urology

## 2016-02-22 ENCOUNTER — Encounter: Payer: Self-pay | Admitting: *Deleted

## 2016-02-22 ENCOUNTER — Ambulatory Visit: Payer: Medicare Other

## 2016-02-22 DIAGNOSIS — I10 Essential (primary) hypertension: Secondary | ICD-10-CM | POA: Insufficient documentation

## 2016-02-22 DIAGNOSIS — E119 Type 2 diabetes mellitus without complications: Secondary | ICD-10-CM | POA: Insufficient documentation

## 2016-02-22 DIAGNOSIS — N201 Calculus of ureter: Secondary | ICD-10-CM | POA: Insufficient documentation

## 2016-02-22 DIAGNOSIS — N200 Calculus of kidney: Secondary | ICD-10-CM

## 2016-02-22 DIAGNOSIS — G40909 Epilepsy, unspecified, not intractable, without status epilepticus: Secondary | ICD-10-CM | POA: Insufficient documentation

## 2016-02-22 HISTORY — PX: EXTRACORPOREAL SHOCK WAVE LITHOTRIPSY: SHX1557

## 2016-02-22 LAB — GLUCOSE, CAPILLARY
GLUCOSE-CAPILLARY: 61 mg/dL — AB (ref 65–99)
GLUCOSE-CAPILLARY: 201 mg/dL — AB (ref 65–99)

## 2016-02-22 SURGERY — LITHOTRIPSY, ESWL
Anesthesia: Moderate Sedation | Laterality: Left

## 2016-02-22 MED ORDER — MIDAZOLAM HCL 2 MG/2ML IJ SOLN
1.0000 mg | Freq: Once | INTRAMUSCULAR | Status: AC
  Administered 2016-02-22: 1 mg via INTRAMUSCULAR

## 2016-02-22 MED ORDER — MORPHINE SULFATE (PF) 10 MG/ML IV SOLN
10.0000 mg | Freq: Once | INTRAVENOUS | Status: AC
  Administered 2016-02-22: 10 mg via INTRAMUSCULAR

## 2016-02-22 MED ORDER — CIPROFLOXACIN HCL 500 MG PO TABS
500.0000 mg | ORAL_TABLET | ORAL | Status: AC
Start: 2016-02-22 — End: 2016-02-22
  Administered 2016-02-22: 500 mg via ORAL

## 2016-02-22 MED ORDER — ONDANSETRON HCL 4 MG/2ML IJ SOLN
4.0000 mg | Freq: Once | INTRAMUSCULAR | Status: AC
  Administered 2016-02-22: 4 mg via INTRAVENOUS

## 2016-02-22 MED ORDER — DEXTROSE-NACL 5-0.45 % IV SOLN
INTRAVENOUS | Status: DC
  Administered 2016-02-22: 12:00:00 via INTRAVENOUS

## 2016-02-22 MED ORDER — CIPROFLOXACIN HCL 500 MG PO TABS
ORAL_TABLET | ORAL | Status: AC
  Administered 2016-02-22: 500 mg via ORAL
  Filled 2016-02-22: qty 1

## 2016-02-22 MED ORDER — MIDAZOLAM HCL 2 MG/2ML IJ SOLN
INTRAMUSCULAR | Status: AC
  Administered 2016-02-22: 1 mg via INTRAMUSCULAR
  Filled 2016-02-22: qty 2

## 2016-02-22 MED ORDER — MORPHINE SULFATE (PF) 10 MG/ML IV SOLN
INTRAVENOUS | Status: AC
  Administered 2016-02-22: 10 mg via INTRAMUSCULAR
  Filled 2016-02-22: qty 1

## 2016-02-22 MED ORDER — ONDANSETRON HCL 4 MG/2ML IJ SOLN
INTRAMUSCULAR | Status: AC
  Administered 2016-02-22: 4 mg via INTRAVENOUS
  Filled 2016-02-22: qty 2

## 2016-02-22 NOTE — Discharge Instructions (Addendum)
AMBULATORY SURGERY  DISCHARGE INSTRUCTIONS   1) The drugs that you were given will stay in your system until tomorrow so for the next 24 hours you should not:  A) Drive an automobile B) Make any legal decisions C) Drink any alcoholic beverage   2) You may resume regular meals tomorrow.  Today it is better to start with liquids and gradually work up to solid foods.  You may eat anything you prefer, but it is better to start with liquids, then soup and crackers, and gradually work up to solid foods.   3) Please notify your doctor immediately if you have any unusual bleeding, trouble breathing, redness and pain at the surgery site, drainage, fever, or pain not relieved by medication.    4) Additional Instructions:        Please contact your physician with any problems or Same Day Surgery at [phone], Monday through Friday 6 am to 4 pm, or [place] at [facility] Main number at [phone].   RX GIVEN FOR PAIN MED IF NEEDED (PERCOCET)

## 2016-02-23 LAB — CULTURE, URINE COMPREHENSIVE

## 2016-03-08 ENCOUNTER — Ambulatory Visit (INDEPENDENT_AMBULATORY_CARE_PROVIDER_SITE_OTHER): Payer: Medicare Other | Admitting: Urology

## 2016-03-08 ENCOUNTER — Encounter: Payer: Self-pay | Admitting: Urology

## 2016-03-08 ENCOUNTER — Ambulatory Visit
Admission: RE | Admit: 2016-03-08 | Discharge: 2016-03-08 | Disposition: A | Discharge status: Home / Self Care | Payer: Medicare Other | Source: Ambulatory Visit | Attending: Urology | Admitting: Urology

## 2016-03-08 ENCOUNTER — Ambulatory Visit: Payer: Medicare Other

## 2016-03-08 VITALS — BP 99/62 | HR 97 | Ht 65.0 in | Wt 162.0 lb

## 2016-03-08 DIAGNOSIS — Z87442 Personal history of urinary calculi: Secondary | ICD-10-CM | POA: Diagnosis present

## 2016-03-08 DIAGNOSIS — Z09 Encounter for follow-up examination after completed treatment for conditions other than malignant neoplasm: Secondary | ICD-10-CM | POA: Diagnosis not present

## 2016-03-08 DIAGNOSIS — R310 Gross hematuria: Secondary | ICD-10-CM

## 2016-03-08 DIAGNOSIS — N200 Calculus of kidney: Secondary | ICD-10-CM

## 2016-03-08 DIAGNOSIS — N201 Calculus of ureter: Secondary | ICD-10-CM

## 2016-03-08 LAB — URINALYSIS, COMPLETE
BILIRUBIN UA: NEGATIVE
Glucose, UA: NEGATIVE
Ketones, UA: NEGATIVE
NITRITE UA: NEGATIVE
PROTEIN UA: NEGATIVE
SPEC GRAV UA: 1.01 (ref 1.005–1.030)
UUROB: 0.2 mg/dL (ref 0.2–1.0)
pH, UA: 5.5 (ref 5.0–7.5)

## 2016-03-08 LAB — MICROSCOPIC EXAMINATION: BACTERIA UA: NONE SEEN

## 2016-03-08 NOTE — Progress Notes (Signed)
11:41 AM   Phillip LandryCurtis R Crandall September 21, 1932 [id number]  Referring provider: [facility] [address] [place], [place] [id number]  Chief Complaint  Patient presents with  . Routine Post Op    2 week follow up from ESWL    HPI: Patient is an 82 year old African-American male who is 2 weeks status post ESWL for a left UVJ stone.  His daughter, Milda SmartBenita, is present with him as he is experiencing mild dementia.   Background history Patient is an 82 year old African-American male who  presented as a referral from [facility] emergency department for nephrolithiasis.  Patient stated that he had the sudden onset of gross hematuria.  He also had the associated left flank pain that radiated to the waist and then into the penis. It startled him so much that he called 911 and was taken to the emergency department.  A CT renal scan was performed in the emergency room on 02/14/2016 and a 5 x 7 mm left UVJ stone was found.  He was then discharged to home and instructed to follow up with urology.  He has baseline urinary symptoms of urgency, dysuria and nocturia.  He does not recall having a prior episode of nephrolithiasis.  Patient underwent left ESWL on 02/22/2016 definitive for his stone.  His post operative course was uneventful.  His UA today was unremarkable.  He has not had flank pain, fevers, chills, nausea or vomiting.  He did not capture a stone fragment.  KUB taken today did not demonstrate a stone.  I have personally reviewed the films.  He denies any gross hematuria.    PMH: Past Medical History  Diagnosis Date  . Hypertension   . Diabetes mellitus without complication ([facility])   . Thyroid disease   . Seizures ([facility])   . Kidney stone     Surgical History: Past Surgical History  Procedure Laterality Date  . Hernia repair  70's  . Extracorporeal shock wave lithotripsy Left 02/22/2016    Procedure: EXTRACORPOREAL SHOCK WAVE LITHOTRIPSY (ESWL);  Surgeon: Hildred LaserBrian James  Budzyn, MD;  Location: [facility] ORS;  Service: Urology;  Laterality: Left;    Home Medications:    Medication List       This list is accurate as of: 03/08/16 11:59 PM.  Always use your most recent med list.               calcitRIOL 0.25 MCG capsule  Commonly known as:  ROCALTROL  Take by mouth.     cephALEXin 500 MG capsule  Commonly known as:  KEFLEX  Take 1 capsule (500 mg total) by mouth 4 (four) times daily.     divalproex 125 MG capsule  Commonly known as:  DEPAKOTE SPRINKLE  Take 250 mg by mouth at bedtime.     donepezil 5 MG tablet  Commonly known as:  ARICEPT  Take 5 mg by mouth at bedtime.     febuxostat 40 MG tablet  Commonly known as:  ULORIC  Take 40 mg by mouth daily.     ferrous sulfate 325 (65 FE) MG tablet  Take 325 mg by mouth daily.     folic acid 1 MG tablet  Commonly known as:  FOLVITE  Take 1 mg by mouth daily.     furosemide 20 MG tablet  Commonly known as:  LASIX  Take 20 mg by mouth every morning.     insulin glargine 100 UNIT/ML injection  Commonly known as:  LANTUS  Inject 45 Units into the skin at bedtime.     insulin lispro 100 UNIT/ML injection  Commonly known as:  HUMALOG  Inject 5 Units into the skin 3 (three) times daily before meals.     levETIRAcetam 500 MG tablet  Commonly known as:  KEPPRA  Take by mouth. Reported on 03/08/2016     levothyroxine 25 MCG tablet  Commonly known as:  SYNTHROID, LEVOTHROID  Take 50 mcg by mouth every Monday, Wednesday, and Friday. Alternating with 25 mcg     levothyroxine 25 MCG tablet  Commonly known as:  SYNTHROID, LEVOTHROID  Take 25 mcg by mouth every Tuesday, Thursday, Saturday, and Sunday. Alternating with 50 mcg     magnesium oxide 400 MG tablet  Commonly known as:  MAG-OX  Take 400 mg by mouth daily.     oxyCODONE-acetaminophen 10-325 MG tablet  Commonly known as:  PERCOCET  Take 1 tablet by mouth every 4 (four) hours as needed for pain.     tamsulosin 0.4 MG Caps capsule    Commonly known as:  FLOMAX  Take 1 capsule (0.4 mg total) by mouth daily.     Valproic Acid 250 MG Cpdr  Take 250 mg by mouth 2 (two) times daily.        Allergies:  Allergies  Allergen Reactions  . Enalapril Swelling    Face swelling.  . Lisinopril Swelling    Family History: Family History  Problem Relation Age of Onset  . Kidney disease Neg Hx   . Prostate cancer Neg Hx     Social History:  reports that he has never smoked. He does not have any smokeless tobacco history on file. He reports that he does not drink alcohol or use illicit drugs.  ROS: UROLOGY Frequent Urination?: No Hard to postpone urination?: No Burning/pain with urination?: No Get up at night to urinate?: Yes Leakage of urine?: No Urine stream starts and stops?: No Trouble starting stream?: No Do you have to strain to urinate?: No Blood in urine?: No Urinary tract infection?: No Sexually transmitted disease?: No Injury to kidneys or bladder?: No Painful intercourse?: No Weak stream?: No Erection problems?: No Penile pain?: No  Gastrointestinal Nausea?: No Vomiting?: No Indigestion/heartburn?: No Diarrhea?: No Constipation?: No  Constitutional Fever: No Night sweats?: No Weight loss?: No Fatigue?: No  Skin Skin rash/lesions?: No Itching?: No  Eyes Blurred vision?: No Double vision?: No  Ears/Nose/Throat Sore throat?: No Sinus problems?: No  Hematologic/Lymphatic Swollen glands?: No Easy bruising?: Yes  Cardiovascular Leg swelling?: No Chest pain?: No  Respiratory Cough?: No Shortness of breath?: No  Endocrine Excessive thirst?: No  Musculoskeletal Back pain?: Yes Joint pain?: No  Neurological Headaches?: No Dizziness?: No  Psychologic Depression?: No Anxiety?: No  Physical Exam: BP 99/62 mmHg  Pulse 97  Ht  (1.651 m)  Wt 162 lb (73.483 kg)  BMI 26.96 kg/m2  Constitutional: Well nourished. Alert and oriented, No acute distress. HEENT: [place]  AT, moist mucus membranes. Trachea midline, no masses. Cardiovascular: No clubbing, cyanosis, or edema. Respiratory: Normal respiratory effort, no increased work of breathing. GI: Abdomen is soft, non tender, non distended, no abdominal masses. Liver and spleen not palpable.  No hernias appreciated.  Stool sample for occult testing is not indicated.   GU: No CVA tenderness.  No bladder fullness or masses.   Skin: No rashes, bruises or suspicious lesions. Lymph: No cervical or inguinal adenopathy. Neurologic: Grossly intact, no focal deficits, moving all 4 extremities. Psychiatric: Normal  mood and affect.  Laboratory Data: Lab Results  Component Value Date   WBC 12.8* 02/14/2016   HGB 10.0* 02/14/2016   HCT 30.5* 02/14/2016   MCV 90.7 02/14/2016   PLT 131* 02/14/2016    Lab Results  Component Value Date   CREATININE 3.12* 02/14/2016    Lab Results  Component Value Date   HGBA1C 7.2* 11/23/2013    Lab Results  Component Value Date   TSH 4.84* 11/23/2013       Component Value Date/Time   CHOL 121 05/10/2013 0415   HDL 60 05/10/2013 0415   VLDL 24 05/10/2013 0415   LDLCALC 37 05/10/2013 0415    Lab Results  Component Value Date   AST 18 02/14/2016   Lab Results  Component Value Date   ALT 12* 02/14/2016    Urinalysis Results for orders placed or performed in visit on 03/08/16  Microscopic Examination  Result Value Ref Range   WBC, UA 11-30 (A) 0 -  5 /hpf   RBC, UA 0-2 0 -  2 /hpf   Epithelial Cells (non renal) 0-10 0 - 10 /hpf   Bacteria, UA None seen None seen/Few  Urinalysis, Complete  Result Value Ref Range   Specific Gravity, UA 1.010 1.005 - 1.030   pH, UA 5.5 5.0 - 7.5   Color, UA Yellow Yellow   Appearance Ur Clear Clear   Leukocytes, UA 2+ (A) Negative   Protein, UA Negative Negative/Trace   Glucose, UA Negative Negative   Ketones, UA Negative Negative   RBC, UA Trace (A) Negative   Bilirubin, UA Negative Negative   Urobilinogen, Ur 0.2  0.2 - 1.0 mg/dL   Nitrite, UA Negative Negative   Microscopic Examination See below:     Pertinent Imaging: CLINICAL DATA: 82 year old male status post lithotripsy 2 weeks ago for distal left side stone disease. Subsequent encounter.  EXAM: ABDOMEN - 1 VIEW  COMPARISON: 02/22/2016 and earlier.  FINDINGS: Previously demonstrated 6 mm left UVJ region calculus is no longer identified. Right upper pole region punctate calculi at demonstrated by CT on 02/13/2016 are not apparent radiographically. Stable small pelvic vascular calcifications otherwise. Normal visible bowel gas pattern. Stable visualized osseous structures.  IMPRESSION: Resolved distal left ureteral calculus. No residual urologic calculi identified radiographically.   Electronically Signed  By: Odessa Fleming M.D.  On: 03/08/2016 13:56  Assessment & Plan:   Patient underwent ESWL for definitive treatment of his left UVJ stone on 02/22/2016.    1. Left UVJ stone:   Patient has completed ESWL for the stone.  He has not passed a fragment.  He is not experiencing flank pain or gross hematuria.    - Urinalysis, Complete  2. Gross hematuria:   Patient has not had any further gross hematuria.  His UA today is negative for hematuria.  We will continue to monitor.    Return in about 1 month (around 04/07/2016) for RUS report .  These notes generated with voice recognition software. I apologize for typographical errors.  Michiel Cowboy, PA-C  [facility] [address] [place], [place] [address] [phone]

## 2016-04-01 ENCOUNTER — Ambulatory Visit
Admission: RE | Admit: 2016-04-01 | Discharge: 2016-04-01 | Disposition: A | Discharge status: Home / Self Care | Payer: Medicare Other | Source: Ambulatory Visit | Attending: Urology | Admitting: Urology

## 2016-04-01 DIAGNOSIS — N329 Bladder disorder, unspecified: Secondary | ICD-10-CM | POA: Insufficient documentation

## 2016-04-01 DIAGNOSIS — N201 Calculus of ureter: Secondary | ICD-10-CM | POA: Insufficient documentation

## 2016-04-01 DIAGNOSIS — R310 Gross hematuria: Secondary | ICD-10-CM | POA: Diagnosis not present

## 2016-04-01 DIAGNOSIS — N281 Cyst of kidney, acquired: Secondary | ICD-10-CM | POA: Diagnosis not present

## 2016-04-12 ENCOUNTER — Ambulatory Visit (INDEPENDENT_AMBULATORY_CARE_PROVIDER_SITE_OTHER): Payer: Medicare Other | Admitting: Urology

## 2016-04-12 ENCOUNTER — Encounter: Payer: Self-pay | Admitting: Urology

## 2016-04-12 VITALS — BP 108/63 | HR 78 | Ht 65.0 in | Wt 162.5 lb

## 2016-04-12 DIAGNOSIS — N201 Calculus of ureter: Secondary | ICD-10-CM

## 2016-04-12 DIAGNOSIS — R310 Gross hematuria: Secondary | ICD-10-CM

## 2016-04-12 DIAGNOSIS — N132 Hydronephrosis with renal and ureteral calculous obstruction: Secondary | ICD-10-CM

## 2016-04-12 LAB — URINALYSIS, COMPLETE
Bilirubin, UA: NEGATIVE
GLUCOSE, UA: NEGATIVE
KETONES UA: NEGATIVE
Nitrite, UA: NEGATIVE
Protein, UA: NEGATIVE
RBC, UA: NEGATIVE
SPEC GRAV UA: 1.015 (ref 1.005–1.030)
Urobilinogen, Ur: 0.2 mg/dL (ref 0.2–1.0)
pH, UA: 7 (ref 5.0–7.5)

## 2016-04-12 LAB — MICROSCOPIC EXAMINATION
Bacteria, UA: NONE SEEN
RBC, UA: NONE SEEN /hpf (ref 0–?)

## 2016-04-12 NOTE — Progress Notes (Signed)
11:29 AM   Phillip Osborne 06/08/1932 [id number]  Referring provider: [facility] [address] [place], [place] [address]  Chief Complaint  Patient presents with  . Results    RUS    HPI: Patient is an 82 year old African-American male who is 4 weeks status post ESWL for a left UVJ stone who presents for RUS results.  His daughter, Phillip Osborne, is present with him as he is experiencing mild dementia.   Background history Patient is an 82 year old African-American male who  presented as a referral from [facility] emergency department for nephrolithiasis.  Patient stated that he had the sudden onset of gross hematuria.  He also had the associated left flank pain that radiated to the waist and then into the penis. It startled him so much that he called 911 and was taken to the emergency department.  A CT renal scan was performed in the emergency room on 02/14/2016 and a 5 x 7 mm left UVJ stone was found.  He was then discharged to home and instructed to follow up with urology.  He has baseline urinary symptoms of urgency, dysuria and nocturia.  He does not recall having a prior episode of nephrolithiasis.  Patient underwent left ESWL on 02/22/2016 definitive for his stone.  His post operative course was uneventful.  His UA today was unremarkable.  He has not had flank pain, fevers, chills, nausea or vomiting.  He did not capture a stone fragment.  KUB taken on 03/08/2016 did not demonstrate a stone.  I have personally reviewed the films.  He denies any gross hematuria.  RUS performed on 04/01/2016 demonstrated that the 5 mm left UVJ was still present.    Today, he is having no symptoms.  He has not passed a fragment.  He has not had gross hematuria.    PMH: Past Medical History  Diagnosis Date  . Hypertension   . Diabetes mellitus without complication (HCC)   . Thyroid disease   . Seizures (HCC)   . Kidney stone     Surgical History: Past Surgical History    Procedure Laterality Date  . Hernia repair  70's  . Extracorporeal shock wave lithotripsy Left 02/22/2016    Procedure: EXTRACORPOREAL SHOCK WAVE LITHOTRIPSY (ESWL);  Surgeon: Hildred Laser, MD;  Location: [facility] ORS;  Service: Urology;  Laterality: Left;    Home Medications:    Medication List       This list is accurate as of: 04/12/16 11:29 AM.  Always use your most recent med list.               calcitRIOL 0.25 MCG capsule  Commonly known as:  ROCALTROL  Take by mouth.     cephALEXin 500 MG capsule  Commonly known as:  KEFLEX  Take 1 capsule (500 mg total) by mouth 4 (four) times daily.     divalproex 125 MG capsule  Commonly known as:  DEPAKOTE SPRINKLE  Take 250 mg by mouth at bedtime.     donepezil 5 MG tablet  Commonly known as:  ARICEPT  Take 5 mg by mouth at bedtime.     febuxostat 40 MG tablet  Commonly known as:  ULORIC  Take 40 mg by mouth daily.     ferrous sulfate 325 (65 FE) MG tablet  Take 325 mg by mouth daily.     folic acid 1 MG tablet  Commonly known as:  FOLVITE  Take 1 mg by mouth  daily.     furosemide 20 MG tablet  Commonly known as:  LASIX  Take 20 mg by mouth every morning.     insulin glargine 100 UNIT/ML injection  Commonly known as:  LANTUS  Inject 45 Units into the skin at bedtime.     insulin lispro 100 UNIT/ML injection  Commonly known as:  HUMALOG  Inject 5 Units into the skin 3 (three) times daily before meals.     levETIRAcetam 500 MG tablet  Commonly known as:  KEPPRA  Take by mouth. Reported on 03/08/2016     levothyroxine 25 MCG tablet  Commonly known as:  SYNTHROID, LEVOTHROID  Take 50 mcg by mouth every Monday, Wednesday, and Friday. Alternating with 25 mcg     levothyroxine 25 MCG tablet  Commonly known as:  SYNTHROID, LEVOTHROID  Take 25 mcg by mouth every Tuesday, Thursday, Saturday, and Sunday. Alternating with 50 mcg     magnesium oxide 400 MG tablet  Commonly known as:  MAG-OX  Take 400 mg by mouth  daily.     oxyCODONE-acetaminophen 10-325 MG tablet  Commonly known as:  PERCOCET  Take 1 tablet by mouth every 4 (four) hours as needed for pain.     tamsulosin 0.4 MG Caps capsule  Commonly known as:  FLOMAX  Take 1 capsule (0.4 mg total) by mouth daily.     Valproic Acid 250 MG Cpdr  Take 250 mg by mouth 2 (two) times daily.        Allergies:  Allergies  Allergen Reactions  . Enalapril Swelling    Face swelling.  . Lisinopril Swelling    Family History: Family History  Problem Relation Age of Onset  . Kidney disease Neg Hx   . Prostate cancer Neg Hx     Social History:  reports that he has never smoked. He does not have any smokeless tobacco history on file. He reports that he does not drink alcohol or use illicit drugs.  ROS: UROLOGY Frequent Urination?: No Hard to postpone urination?: No Burning/pain with urination?: No Get up at night to urinate?: No Leakage of urine?: No Urine stream starts and stops?: No Trouble starting stream?: No Do you have to strain to urinate?: No Blood in urine?: No Urinary tract infection?: No Sexually transmitted disease?: No Injury to kidneys or bladder?: No Painful intercourse?: No Weak stream?: No Erection problems?: No Penile pain?: No  Gastrointestinal Nausea?: No Vomiting?: No Indigestion/heartburn?: No Diarrhea?: No Constipation?: No  Constitutional Fever: No Night sweats?: No Weight loss?: No Fatigue?: No  Skin Skin rash/lesions?: No Itching?: No  Eyes Blurred vision?: No Double vision?: No  Ears/Nose/Throat Sore throat?: No Sinus problems?: No  Hematologic/Lymphatic Swollen glands?: No Easy bruising?: No  Cardiovascular Leg swelling?: No Chest pain?: No  Respiratory Cough?: No Shortness of breath?: No  Endocrine Excessive thirst?: No  Musculoskeletal Back pain?: Yes Joint pain?: No  Neurological Headaches?: No Dizziness?: No  Psychologic Depression?: No Anxiety?:  No  Physical Exam: BP 108/63 mmHg  Pulse 78  Ht 5\' 5"  (1.651 m)  Wt 162 lb 8 oz (73.71 kg)  BMI 27.04 kg/m2  Constitutional: Well nourished. Alert and oriented, No acute distress. HEENT: [place] AT, moist mucus membranes. Trachea midline, no masses. Cardiovascular: No clubbing, cyanosis, or edema. Respiratory: Normal respiratory effort, no increased work of breathing. GI: Abdomen is soft, non tender, non distended, no abdominal masses. Liver and spleen not palpable.  No hernias appreciated.  Stool sample for occult testing is not  indicated.   GU: No CVA tenderness.  No bladder fullness or masses.   Skin: No rashes, bruises or suspicious lesions. Lymph: No cervical or inguinal adenopathy. Neurologic: Grossly intact, no focal deficits, moving all 4 extremities. Psychiatric: Normal mood and affect.  Laboratory Data: Lab Results  Component Value Date   WBC 12.8* 02/14/2016   HGB 10.0* 02/14/2016   HCT 30.5* 02/14/2016   MCV 90.7 02/14/2016   PLT 131* 02/14/2016    Lab Results  Component Value Date   CREATININE 3.12* 02/14/2016    Lab Results  Component Value Date   HGBA1C 7.2* 11/23/2013    Lab Results  Component Value Date   TSH 4.84* 11/23/2013       Component Value Date/Time   CHOL 121 05/10/2013 0415   HDL 60 05/10/2013 0415   VLDL 24 05/10/2013 0415   LDLCALC 37 05/10/2013 0415    Lab Results  Component Value Date   AST 18 02/14/2016   Lab Results  Component Value Date   ALT 12* 02/14/2016    Urinalysis Results for orders placed or performed in visit on 04/12/16  Microscopic Examination  Result Value Ref Range   WBC, UA 0-5 0 -  5 /hpf   RBC, UA None seen 0 -  2 /hpf   Epithelial Cells (non renal) 0-10 0 - 10 /hpf   Bacteria, UA None seen None seen/Few  Urinalysis, Complete  Result Value Ref Range   Specific Gravity, UA 1.015 1.005 - 1.030   pH, UA 7.0 5.0 - 7.5   Color, UA Yellow Yellow   Appearance Ur Clear Clear   Leukocytes, UA Trace (A)  Negative   Protein, UA Negative Negative/Trace   Glucose, UA Negative Negative   Ketones, UA Negative Negative   RBC, UA Negative Negative   Bilirubin, UA Negative Negative   Urobilinogen, Ur 0.2 0.2 - 1.0 mg/dL   Nitrite, UA Negative Negative   Microscopic Examination See below:     Pertinent Imaging: CLINICAL DATA: Left ureteral calculus. Gross hematuria. Recent lithotripsy on 02/22/2016.  EXAM: RENAL / URINARY TRACT ULTRASOUND COMPLETE  COMPARISON: CT on 02/13/2016  FINDINGS: Right Kidney:  Length: 10.0 cm. Echogenicity within normal limits. Simple appearing cyst seen in lower pole measuring 2.9 cm. No mass or hydronephrosis visualized.  Left Kidney:  Length: 9.3 cm. Echogenicity within normal limits. Simple appearing cyst seen in lower pole measuring 2.6 cm. No mass or hydronephrosis visualized.  Bladder:  Urinary bladder is nondilated. Mild diffuse wall thickening is noted as seen on recent CT. A shadowing calculus is seen in the region of the left ureterovesical junction which measures 5 mm, without significant change compared to previous CT.  IMPRESSION: 5 mm shadowing calculus at left ureterovesical junction, as seen on recent CT.  No evidence of hydronephrosis. Bilateral renal cysts incidentally noted.  Diffuse bladder wall thickening as seen on previous CT, which may be due to chronic bladder outlet obstruction or cystitis.   Electronically Signed  By: Myles RosenthalJohn Stahl M.D.  On: 04/01/2016 15:07  Assessment & Plan:   Patient underwent ESWL for definitive treatment of his left UVJ stone on 02/22/2016.    1. Left UVJ stone:   Patient has completed ESWL for the stone.  He has not passed a fragment.  He is not experiencing flank pain or gross hematuria.  RUS demonstrates the presents of the left UVJ.  We discussed repeated the RUS in one month or obtaining a CT study.  He would like  to return in one month for RUS.    - Urinalysis,  Complete  2. Gross hematuria:   Patient has not had any further gross hematuria.  His UA today is negative for hematuria.  We will continue to monitor.    Return in about 1 month (around 05/13/2016) for RUS report.  These notes generated with voice recognition software. I apologize for typographical errors.  Michiel Cowboy, PA-C  [facility] [address], [address] [place], [place] [address] [phone]

## 2016-05-04 ENCOUNTER — Emergency Department
Admission: EM | Admit: 2016-05-04 | Discharge: 2016-05-04 | Disposition: A | Discharge status: Home / Self Care | Payer: Medicare Other | Attending: Emergency Medicine | Admitting: Emergency Medicine

## 2016-05-04 ENCOUNTER — Emergency Department: Payer: Medicare Other

## 2016-05-04 DIAGNOSIS — E119 Type 2 diabetes mellitus without complications: Secondary | ICD-10-CM | POA: Insufficient documentation

## 2016-05-04 DIAGNOSIS — S0990XA Unspecified injury of head, initial encounter: Secondary | ICD-10-CM | POA: Diagnosis not present

## 2016-05-04 DIAGNOSIS — Y929 Unspecified place or not applicable: Secondary | ICD-10-CM | POA: Diagnosis not present

## 2016-05-04 DIAGNOSIS — R55 Syncope and collapse: Secondary | ICD-10-CM | POA: Diagnosis present

## 2016-05-04 DIAGNOSIS — Y9389 Activity, other specified: Secondary | ICD-10-CM | POA: Diagnosis not present

## 2016-05-04 DIAGNOSIS — Z794 Long term (current) use of insulin: Secondary | ICD-10-CM | POA: Insufficient documentation

## 2016-05-04 DIAGNOSIS — W1839XA Other fall on same level, initial encounter: Secondary | ICD-10-CM | POA: Diagnosis not present

## 2016-05-04 DIAGNOSIS — Y999 Unspecified external cause status: Secondary | ICD-10-CM | POA: Insufficient documentation

## 2016-05-04 DIAGNOSIS — I10 Essential (primary) hypertension: Secondary | ICD-10-CM | POA: Insufficient documentation

## 2016-05-04 LAB — CBC
HEMATOCRIT: 33.6 % — AB (ref 40.0–52.0)
HEMOGLOBIN: 11.3 g/dL — AB (ref 13.0–18.0)
MCH: 30.3 pg (ref 26.0–34.0)
MCHC: 33.7 g/dL (ref 32.0–36.0)
MCV: 89.9 fL (ref 80.0–100.0)
Platelets: 137 10*3/uL — ABNORMAL LOW (ref 150–440)
RBC: 3.74 MIL/uL — ABNORMAL LOW (ref 4.40–5.90)
RDW: 15.3 % — ABNORMAL HIGH (ref 11.5–14.5)
WBC: 7.8 10*3/uL (ref 3.8–10.6)

## 2016-05-04 LAB — COMPREHENSIVE METABOLIC PANEL
ALBUMIN: 4.3 g/dL (ref 3.5–5.0)
ALK PHOS: 43 U/L (ref 38–126)
ALT: 12 U/L — AB (ref 17–63)
AST: 22 U/L (ref 15–41)
Anion gap: 13 (ref 5–15)
BILIRUBIN TOTAL: 0.7 mg/dL (ref 0.3–1.2)
BUN: 57 mg/dL — AB (ref 6–20)
CALCIUM: 9.2 mg/dL (ref 8.9–10.3)
CO2: 24 mmol/L (ref 22–32)
Chloride: 103 mmol/L (ref 101–111)
Creatinine, Ser: 3.11 mg/dL — ABNORMAL HIGH (ref 0.61–1.24)
GFR calc Af Amer: 20 mL/min — ABNORMAL LOW (ref >60)
GFR calc non Af Amer: 17 mL/min — ABNORMAL LOW (ref >60)
GLUCOSE: 144 mg/dL — AB (ref 65–99)
Potassium: 4.9 mmol/L (ref 3.5–5.1)
Sodium: 140 mmol/L (ref 135–145)
TOTAL PROTEIN: 8.3 g/dL — AB (ref 6.5–8.1)

## 2016-05-04 LAB — VALPROIC ACID LEVEL: Valproic Acid Lvl: 100 ug/mL (ref 50.0–100.0)

## 2016-05-04 LAB — TROPONIN I: Troponin I: <0.03 ng/mL (ref <0.03)

## 2016-05-04 MED ORDER — SODIUM CHLORIDE 0.9 % IV BOLUS (SEPSIS)
500.0000 mL | Freq: Once | INTRAVENOUS | Status: AC
  Administered 2016-05-04: 500 mL via INTRAVENOUS

## 2016-05-04 NOTE — ED Notes (Signed)
Lab at bedside drawing blood.

## 2016-05-04 NOTE — ED Notes (Signed)
Pt transported to xray 

## 2016-05-04 NOTE — ED Triage Notes (Signed)
Patient from home via [facility] with complaint of seizure x2. Patient states that he remembers the seizure and remembers "falling forward off the commode". Denies memory of floor impact. Denies postictal period, cooberated with EMS. Hx of szrs, on depakote. Patient has recent hx of cough and was recently started on a medication, patient unsure of name. Patient presents with productive cough with thick yellow sputum in the ER as well as a  0.5" laceration to the bridge of the nose.

## 2016-05-04 NOTE — ED Provider Notes (Signed)
[facility] Emergency Department Provider Note   ____________________________________________    I have reviewed the triage vital signs and the nursing notes.   HISTORY  Chief Complaint syncope  Patient with apparent history of dementia  HPI Phillip Osborne is a 82 y.o. male who presents after a syncopal episode. Patient reports he was sitting on the toilet and went to wipe himself and felt lightheaded and fell forward. He hit his head on the floor but he does not think that he lost consciousness. Patient does have a history of seizures but there is no evidence of seizure-like activity. EMS reports there was no postictal period. Patient reports he feels well early. He does not feel dizzy. He has no chest pain. He does have cough which is PCP has been treating. EMS reports glucose of 168   Past Medical History:  Diagnosis Date  . Diabetes mellitus without complication (HCC)   . Hypertension   . Kidney stone   . Seizures (HCC)   . Thyroid disease     Patient Active Problem List   Diagnosis Date Noted  . Left ureteral stone 02/20/2016  . Gross hematuria 02/20/2016    Past Surgical History:  Procedure Laterality Date  . EXTRACORPOREAL SHOCK WAVE LITHOTRIPSY Left 02/22/2016   Procedure: EXTRACORPOREAL SHOCK WAVE LITHOTRIPSY (ESWL);  Surgeon: Hildred Laser, MD;  Location: [facility] ORS;  Service: Urology;  Laterality: Left;  . HERNIA REPAIR  70's    Prior to Admission medications   Medication Sig Start Date End Date Taking? Authorizing Provider  calcitRIOL (ROCALTROL) 0.25 MCG capsule Take by mouth.    Historical Provider, MD  cephALEXin (KEFLEX) 500 MG capsule Take 1 capsule (500 mg total) by mouth 4 (four) times daily. Patient not taking: Reported on 03/08/2016 02/13/16   Rebecka Apley, MD  divalproex (DEPAKOTE SPRINKLE) 125 MG capsule Take 250 mg by mouth at bedtime.    Historical Provider, MD  donepezil (ARICEPT) 5 MG tablet Take 5 mg by  mouth at bedtime.    Historical Provider, MD  febuxostat (ULORIC) 40 MG tablet Take 40 mg by mouth daily.    Historical Provider, MD  ferrous sulfate 325 (65 FE) MG tablet Take 325 mg by mouth daily.    Historical Provider, MD  folic acid (FOLVITE) 1 MG tablet Take 1 mg by mouth daily.    Historical Provider, MD  furosemide (LASIX) 20 MG tablet Take 20 mg by mouth every morning.    Historical Provider, MD  insulin glargine (LANTUS) 100 UNIT/ML injection Inject 45 Units into the skin at bedtime.    Historical Provider, MD  insulin lispro (HUMALOG) 100 UNIT/ML injection Inject 5 Units into the skin 3 (three) times daily before meals.    Historical Provider, MD  levETIRAcetam (KEPPRA) 500 MG tablet Take by mouth. Reported on 03/08/2016 02/20/16 08/18/16  Historical Provider, MD  levothyroxine (SYNTHROID, LEVOTHROID) 25 MCG tablet Take 50 mcg by mouth every Monday, Wednesday, and Friday. Alternating with 25 mcg    Historical Provider, MD  levothyroxine (SYNTHROID, LEVOTHROID) 25 MCG tablet Take 25 mcg by mouth every Tuesday, Thursday, Saturday, and Sunday. Alternating with 50 mcg    Historical Provider, MD  magnesium oxide (MAG-OX) 400 MG tablet Take 400 mg by mouth daily.    Historical Provider, MD  oxyCODONE-acetaminophen (PERCOCET) 10-325 MG tablet Take 1 tablet by mouth every 4 (four) hours as needed for pain.    Historical Provider, MD  tamsulosin (FLOMAX) 0.4 MG CAPS  capsule Take 1 capsule (0.4 mg total) by mouth daily. 02/13/16   Rebecka Apley, MD  Valproic Acid 250 MG CPDR Take 250 mg by mouth 2 (two) times daily.    Historical Provider, MD  .   Allergies Enalapril and Lisinopril  Family History  Problem Relation Age of Onset  . Kidney disease Neg Hx   . Prostate cancer Neg Hx     Social History Social History  Substance Use Topics  . Smoking status: Never Smoker  . Smokeless tobacco: Not on file  . Alcohol use No    Review of Systems  Constitutional: denies dizziness at this  time Eyes: No visual changes.  ENT: No neck pain Cardiovascular: Denies chest pain.no palpitations Respiratory: Denies shortness of breath.Positive cough Gastrointestinal: No abdominal pain.  No nausea, no vomiting.    Musculoskeletal: Negative for back pain.no extremity injury Skin: Negative for rash.abrasion to the nose Neurological: Negative for headaches or weakness  10-point ROS otherwise negative.  ____________________________________________   PHYSICAL EXAM:  VITAL SIGNS: BP 122/71   Pulse 82   Resp 14   SpO2 95%    Constitutional: Alert. No acute distress. Pleasant and interactive Eyes: Conjunctivae are normal.  Head: small abrasion to the bridge of the nose, no nasal swelling, no septal hematoma Nose: No congestion/rhinnorhea. Mouth/Throat: Mucous membranes are moist.   Neck:  Painless ROM Cardiovascular: Normal rate, regular rhythm. Grossly normal heart sounds.  Good peripheral circulation. Respiratory: Normal respiratory effort.  No retractions. Lungs CTAB. Gastrointestinal: Soft and nontender. No distention.  No CVA tenderness. Genitourinary: deferred Musculoskeletal: No lower extremity tenderness nor edema.  Warm and well perfused Neurologic:  Normal speech and language. No gross focal neurologic deficits are appreciated.  Skin:  Skin is warm, dry and intact. No rash noted. Psychiatric: Mood and affect are normal. Speech and behavior are normal.  ____________________________________________   LABS (all labs ordered are listed, but only abnormal results are displayed)  Labs Reviewed  VALPROIC ACID LEVEL  CBC  COMPREHENSIVE METABOLIC PANEL  TROPONIN I   ____________________________________________  EKG  ED ECG REPORT I, Jene Every, the attending physician, personally viewed and interpreted this ECG.  Date: 05/04/2016 EKG Time: 8:26 AM Rate: 76 Rhythm: normal sinus rhythm QRS Axis: normal Intervals: normal ST/T Wave abnormalities:  normal Conduction Disturbances: none Narrative Interpretation: LV[facility]  ____________________________________________  RADIOLOGY  CT head unremarkable Chest x-ray unremarkable ____________________________________________   PROCEDURES  Procedure(s) performed: No    Critical Care performed: No ____________________________________________   INITIAL IMPRESSION / ASSESSMENT AND PLAN / ED COURSE  Pertinent labs & imaging results that were available during my care of the patient were reviewed by me and considered in my medical decision making (see chart for details).  Patient presents what sounds like a near syncopal episode. We will check labs including troponin. EKG is reassuring. We'll do CT head given his head injury and reevaluate.  ----------------------------------------- 10:08 AM on 05/04/2016 -----------------------------------------  Lab work is in line with prior results.  ----------------------------------------- 11:08 AM on 05/04/2016 -----------------------------------------  Orthostatics normal, chest x-ray unremarkable. Patient well-appearing and comfortable. Workup is unremarkable. Recommended discharge and I discussed this extensively with family and patient. They agree with this plan. We also discussed return precautions.  Clinical Course   ____________________________________________   FINAL CLINICAL IMPRESSION(S) / ED DIAGNOSES  Final diagnoses:  Near syncope  Head injury, initial encounter      NEW MEDICATIONS STARTED DURING THIS VISIT:  New Prescriptions   No medications on file  Note:  This document was prepared using Dragon voice recognition software and may include unintentional dictation errors.    Jene Every, MD 05/04/16 [id number]

## 2016-05-04 NOTE — ED Notes (Signed)
Pt transported to CT ?

## 2016-05-22 ENCOUNTER — Encounter: Payer: Medicare Other | Admitting: Urology

## 2016-05-24 ENCOUNTER — Ambulatory Visit: Payer: Medicare Other

## 2016-05-25 NOTE — Progress Notes (Signed)
This encounter was created in error - please disregard.

## 2016-05-27 ENCOUNTER — Ambulatory Visit
Admission: RE | Admit: 2016-05-27 | Discharge: 2016-05-27 | Disposition: A | Discharge status: Home / Self Care | Payer: Medicare Other | Source: Ambulatory Visit | Attending: Urology | Admitting: Urology

## 2016-05-27 DIAGNOSIS — N1330 Unspecified hydronephrosis: Secondary | ICD-10-CM | POA: Diagnosis present

## 2016-05-27 DIAGNOSIS — Q6102 Congenital multiple renal cysts: Secondary | ICD-10-CM | POA: Diagnosis not present

## 2016-05-27 DIAGNOSIS — N201 Calculus of ureter: Secondary | ICD-10-CM

## 2016-05-27 DIAGNOSIS — N132 Hydronephrosis with renal and ureteral calculous obstruction: Secondary | ICD-10-CM

## 2016-05-31 ENCOUNTER — Encounter: Payer: Self-pay | Admitting: Urology

## 2016-05-31 ENCOUNTER — Ambulatory Visit (INDEPENDENT_AMBULATORY_CARE_PROVIDER_SITE_OTHER): Payer: Medicare Other | Admitting: Urology

## 2016-05-31 VITALS — BP 116/64 | HR 81 | Ht 65.0 in | Wt 157.5 lb

## 2016-05-31 DIAGNOSIS — N201 Calculus of ureter: Secondary | ICD-10-CM | POA: Diagnosis not present

## 2016-05-31 DIAGNOSIS — R310 Gross hematuria: Secondary | ICD-10-CM | POA: Diagnosis not present

## 2016-05-31 NOTE — Progress Notes (Signed)
10:07 AM   Richardson Landry 01/31/1932 [id number]  Referring provider: Marisue Ivan, MD [id number] [facility] [address] [facility] [place], [place] [address]  Chief Complaint  Patient presents with  . Results    RUS    HPI: Patient is an 82 year old African-American male who is 2 months status post ESWL for a left UVJ stone who presents for RUS results.  His daughter, Mitzi Davenport, is present with him as he is experiencing mild dementia.   Background history Patient is an 82 year old African-American male who  presented as a referral from [facility] emergency department for nephrolithiasis.  Patient stated that he had the sudden onset of gross hematuria.  He also had the associated left flank pain that radiated to the waist and then into the penis. It startled him so much that he called 911 and was taken to the emergency department.  A CT renal scan was performed in the emergency room on 02/14/2016 and a 5 x 7 mm left UVJ stone was found.  He was then discharged to home and instructed to follow up with urology.  He has baseline urinary symptoms of urgency, dysuria and nocturia.  He does not recall having a prior episode of nephrolithiasis.  Patient underwent left ESWL on 02/22/2016 definitive for his stone.  His post operative course was uneventful.  His UA today was unremarkable.  He has not had flank pain, fevers, chills, nausea or vomiting.  He did not capture a stone fragment.  KUB taken on 03/08/2016 did not demonstrate a stone.  I have personally reviewed the films.  He denies any gross hematuria.  RUS performed on 04/01/2016 demonstrated that the 5 mm left UVJ was still present.    Today, he is having no symptoms.  He has not passed a fragment.  He has not had gross hematuria.  He has baseline frequency, urgency and nocturia.  He has not had fevers, chills, nausea or vomiting.  He did not capture a fragment.    RUS performed on 05/27/2016 noted a cyst in each kidney. The renal  echogenicity is increased bilaterally. The left kidney is small with renal cortical thinning. The appearance of the kidneys raises question of a degree of medical renal disease. On the left, there may also be a degree of renal artery stenosis given the overall appearance. In this regard, question whether patient is hypertensive. No obstructing lesions are evident on either side.  I have personally reviewed the films.    PMH: Past Medical History:  Diagnosis Date  . Diabetes mellitus without complication (HCC)   . Hypertension   . Kidney stone   . Seizures (HCC)   . Thyroid disease     Surgical History: Past Surgical History:  Procedure Laterality Date  . EXTRACORPOREAL SHOCK WAVE LITHOTRIPSY Left 02/22/2016   Procedure: EXTRACORPOREAL SHOCK WAVE LITHOTRIPSY (ESWL);  Surgeon: Hildred Laser, MD;  Location: [facility] ORS;  Service: Urology;  Laterality: Left;  . HERNIA REPAIR  70's    Home Medications:    Medication List       Accurate as of 05/31/16 10:07 AM. Always use your most recent med list.          ACCU-CHEK AVIVA test strip Generic drug:  glucose blood 1 each by Other route 3 (three) times daily. (Fasting and 2 hours after largest meal)   calcitRIOL 0.25 MCG capsule Commonly known as:  ROCALTROL Take by mouth.   donepezil 5 MG tablet Commonly known as:  ARICEPT Take 5 mg by mouth at bedtime.   febuxostat 40 MG tablet Commonly known as:  ULORIC Take 40 mg by mouth daily.   ferrous sulfate 325 (65 FE) MG tablet Take 325 mg by mouth daily.   ferrous sulfate 325 (65 FE) MG EC tablet Take by mouth.   folic acid 1 MG tablet Commonly known as:  FOLVITE Take 1 mg by mouth daily.   furosemide 20 MG tablet Commonly known as:  LASIX Take 20 mg by mouth every morning.   insulin glargine 100 UNIT/ML injection Commonly known as:  LANTUS Inject 45 Units into the skin at bedtime.   insulin lispro 100 UNIT/ML injection Commonly known as:  HUMALOG Inject 5 Units  into the skin 3 (three) times daily before meals. (Hold if CBG is less than 100)   levETIRAcetam 500 MG tablet Commonly known as:  KEPPRA Take 500 mg by mouth 2 (two) times daily. Reported on 03/08/2016   levothyroxine 25 MCG tablet Commonly known as:  SYNTHROID, LEVOTHROID Take 50 mcg by mouth every Monday, Wednesday, and Friday. Alternating with 25 mcg   levothyroxine 25 MCG tablet Commonly known as:  SYNTHROID, LEVOTHROID Take 25 mcg by mouth every Tuesday, Thursday, Saturday, and Sunday. Alternating with 50 mcg   magnesium oxide 400 MG tablet Commonly known as:  MAG-OX Take 400 mg by mouth daily.   oxyCODONE-acetaminophen 10-325 MG tablet Commonly known as:  PERCOCET Take 1 tablet by mouth every 4 (four) hours as needed for pain.   sodium bicarbonate 650 MG tablet Take 1,300 mg by mouth 3 (three) times daily.   tamsulosin 0.4 MG Caps capsule Commonly known as:  FLOMAX Take 1 capsule (0.4 mg total) by mouth daily.   UNABLE TO FIND Sodium Bicarb 10 grain tablet- 2 tablets PO TID   Valproic Acid 250 MG Cpdr Take 250 mg by mouth 2 (two) times daily.       Allergies:  Allergies  Allergen Reactions  . Enalapril Swelling    Face swelling.  . Lisinopril Swelling    Family History: Family History  Problem Relation Age of Onset  . Kidney disease Neg Hx   . Prostate cancer Neg Hx     Social History:  reports that he has never smoked. He does not have any smokeless tobacco history on file. He reports that he does not drink alcohol or use drugs.  ROS: UROLOGY Frequent Urination?: Yes Hard to postpone urination?: Yes Burning/pain with urination?: No Get up at night to urinate?: Yes Leakage of urine?: No Urine stream starts and stops?: No Trouble starting stream?: No Do you have to strain to urinate?: No Blood in urine?: No Urinary tract infection?: No Sexually transmitted disease?: No Injury to kidneys or bladder?: No Painful intercourse?: No Weak stream?:  No Erection problems?: No Penile pain?: No  Gastrointestinal Nausea?: No Vomiting?: No Indigestion/heartburn?: No Diarrhea?: No Constipation?: No  Constitutional Fever: No Night sweats?: No Weight loss?: No Fatigue?: No  Skin Skin rash/lesions?: No Itching?: No  Eyes Blurred vision?: No Double vision?: No  Ears/Nose/Throat Sore throat?: No Sinus problems?: No  Hematologic/Lymphatic Swollen glands?: No Easy bruising?: No  Cardiovascular Leg swelling?: No Chest pain?: No  Respiratory Cough?: No Shortness of breath?: No  Endocrine Excessive thirst?: No  Musculoskeletal Back pain?: No Joint pain?: No  Neurological Headaches?: No Dizziness?: No  Psychologic Depression?: No Anxiety?: No  Physical Exam: BP 116/64   Pulse 81   Ht 5\' 5"  (1.651 m)  Wt 157 lb 8 oz (71.4 kg)   BMI 26.21 kg/m   Constitutional: Well nourished. Alert and oriented, No acute distress. HEENT: [place] AT, moist mucus membranes. Trachea midline, no masses. Cardiovascular: No clubbing, cyanosis, or edema. Respiratory: Normal respiratory effort, no increased work of breathing. GI: Abdomen is soft, non tender, non distended, no abdominal masses. Liver and spleen not palpable.  No hernias appreciated.  Stool sample for occult testing is not indicated.   GU: No CVA tenderness.  No bladder fullness or masses.   Skin: No rashes, bruises or suspicious lesions. Lymph: No cervical or inguinal adenopathy. Neurologic: Grossly intact, no focal deficits, moving all 4 extremities. Psychiatric: Normal mood and affect.  Laboratory Data: Lab Results  Component Value Date   WBC 7.8 05/04/2016   HGB 11.3 (L) 05/04/2016   HCT 33.6 (L) 05/04/2016   MCV 89.9 05/04/2016   PLT 137 (L) 05/04/2016    Lab Results  Component Value Date   CREATININE 3.11 (H) 05/04/2016    Lab Results  Component Value Date   HGBA1C 7.2 (H) 11/23/2013    Lab Results  Component Value Date   TSH 4.84 (H)  11/23/2013       Component Value Date/Time   CHOL 121 05/10/2013 0415   HDL 60 05/10/2013 0415   VLDL 24 05/10/2013 0415   LDLCALC 37 05/10/2013 0415    Lab Results  Component Value Date   AST 22 05/04/2016   Lab Results  Component Value Date   ALT 12 (L) 05/04/2016    Urinalysis Results for orders placed or performed during the hospital encounter of 05/04/16  Valproic acid level  Result Value Ref Range   Valproic Acid Lvl 100 50.0 - 100.0 ug/mL  CBC  Result Value Ref Range   WBC 7.8 3.8 - 10.6 K/uL   RBC 3.74 (L) 4.40 - 5.90 MIL/uL   Hemoglobin 11.3 (L) 13.0 - 18.0 g/dL   HCT 16.133.6 (L) 09.640.0 - 04.552.0 %   MCV 89.9 80.0 - 100.0 fL   MCH 30.3 26.0 - 34.0 pg   MCHC 33.7 32.0 - 36.0 g/dL   RDW 40.915.3 (H) 81.111.5 - 91.414.5 %   Platelets 137 (L) 150 - 440 K/uL  Comprehensive metabolic panel  Result Value Ref Range   Sodium 140 135 - 145 mmol/L   Potassium 4.9 3.5 - 5.1 mmol/L   Chloride 103 101 - 111 mmol/L   CO2 24 22 - 32 mmol/L   Glucose, Bld 144 (H) 65 - 99 mg/dL   BUN 57 (H) 6 - 20 mg/dL   Creatinine, Ser 7.823.11 (H) 0.61 - 1.24 mg/dL   Calcium 9.2 8.9 - 95.610.3 mg/dL   Total Protein 8.3 (H) 6.5 - 8.1 g/dL   Albumin 4.3 3.5 - 5.0 g/dL   AST 22 15 - 41 U/L   ALT 12 (L) 17 - 63 U/L   Alkaline Phosphatase 43 38 - 126 U/L   Total Bilirubin 0.7 0.3 - 1.2 mg/dL   GFR calc non Af Amer 17 (L) >60 mL/min   GFR calc Af Amer 20 (L) >60 mL/min   Anion gap 13 5 - 15  Troponin I  Result Value Ref Range   Troponin I <0.03 <0.03 ng/mL    Pertinent Imaging: CLINICAL DATA:  Recent ureteral calculus  EXAM: RENAL / URINARY TRACT ULTRASOUND COMPLETE  COMPARISON:  April 01, 2016  FINDINGS: Right Kidney:  Length: 9.7 cm. Echogenicity is increased. The renal cortical thickness is normal. There  is a cyst in the mid to lower pole right kidney region measuring 2.4 x 2.8 x 2.4 cm. No perinephric fluid or hydronephrosis visualized. No sonographically demonstrable calculus or  ureterectasis.  Left Kidney:  Length: 8.6 cm. Echogenicity is increased. There is mild renal cortical thinning. No perinephric fluid or hydronephrosis visualized. There is a cyst arising from the mid to lower pole left kidney measuring 2.4 x 2.4 x 2.3 cm. No sonographically demonstrable calculus or ureterectasis.  Bladder:  Appears normal for degree of bladder distention.  IMPRESSION: There is a cyst in each kidney. The renal echogenicity is increased bilaterally. The left kidney is small with renal cortical thinning. The appearance of the kidneys raises question of a degree of medical renal disease. On the left, there may also be a degree of renal artery stenosis given the overall appearance. In this regard, question whether patient is hypertensive. No obstructing lesions are evident on either side.   Electronically Signed   By: Bretta Bang III M.D.   On: 05/27/2016 15:37  Assessment & Plan:   Patient underwent ESWL for definitive treatment of his left UVJ stone on 02/22/2016.    1. Left UVJ stone  - no stone or hydronephrosis seen on RUS  - RTC PRN for flank pain or gross hematuria  2. Gross hematuria  - resolved  Return if symptoms worsen or fail to improve.  These notes generated with voice recognition software. I apologize for typographical errors.  Michiel Cowboy, PA-C  [facility] [address], [address] [place], [place] [address] [phone]

## 2016-11-04 ENCOUNTER — Emergency Department: Payer: Medicare Other

## 2016-11-04 ENCOUNTER — Observation Stay
Admission: EM | Admit: 2016-11-04 | Discharge: 2016-11-05 | Disposition: A | Discharge status: Home / Self Care | Payer: Medicare Other | Attending: Internal Medicine | Admitting: Internal Medicine

## 2016-11-04 ENCOUNTER — Encounter: Payer: Self-pay | Admitting: Emergency Medicine

## 2016-11-04 DIAGNOSIS — I129 Hypertensive chronic kidney disease with stage 1 through stage 4 chronic kidney disease, or unspecified chronic kidney disease: Secondary | ICD-10-CM | POA: Insufficient documentation

## 2016-11-04 DIAGNOSIS — E1140 Type 2 diabetes mellitus with diabetic neuropathy, unspecified: Secondary | ICD-10-CM | POA: Insufficient documentation

## 2016-11-04 DIAGNOSIS — E1122 Type 2 diabetes mellitus with diabetic chronic kidney disease: Secondary | ICD-10-CM | POA: Insufficient documentation

## 2016-11-04 DIAGNOSIS — E119 Type 2 diabetes mellitus without complications: Secondary | ICD-10-CM | POA: Diagnosis not present

## 2016-11-04 DIAGNOSIS — N23 Unspecified renal colic: Secondary | ICD-10-CM | POA: Diagnosis not present

## 2016-11-04 DIAGNOSIS — Z79899 Other long term (current) drug therapy: Secondary | ICD-10-CM | POA: Insufficient documentation

## 2016-11-04 DIAGNOSIS — R52 Pain, unspecified: Secondary | ICD-10-CM

## 2016-11-04 DIAGNOSIS — N184 Chronic kidney disease, stage 4 (severe): Secondary | ICD-10-CM | POA: Diagnosis not present

## 2016-11-04 DIAGNOSIS — N210 Calculus in bladder: Secondary | ICD-10-CM | POA: Insufficient documentation

## 2016-11-04 DIAGNOSIS — Z794 Long term (current) use of insulin: Secondary | ICD-10-CM | POA: Insufficient documentation

## 2016-11-04 DIAGNOSIS — F0390 Unspecified dementia without behavioral disturbance: Secondary | ICD-10-CM | POA: Diagnosis not present

## 2016-11-04 DIAGNOSIS — R569 Unspecified convulsions: Secondary | ICD-10-CM | POA: Diagnosis not present

## 2016-11-04 DIAGNOSIS — N281 Cyst of kidney, acquired: Secondary | ICD-10-CM | POA: Diagnosis not present

## 2016-11-04 DIAGNOSIS — N183 Chronic kidney disease, stage 3 (moderate): Secondary | ICD-10-CM | POA: Diagnosis not present

## 2016-11-04 DIAGNOSIS — E875 Hyperkalemia: Secondary | ICD-10-CM | POA: Diagnosis not present

## 2016-11-04 DIAGNOSIS — M109 Gout, unspecified: Secondary | ICD-10-CM | POA: Diagnosis not present

## 2016-11-04 DIAGNOSIS — N400 Enlarged prostate without lower urinary tract symptoms: Secondary | ICD-10-CM | POA: Insufficient documentation

## 2016-11-04 DIAGNOSIS — R109 Unspecified abdominal pain: Secondary | ICD-10-CM

## 2016-11-04 DIAGNOSIS — E039 Hypothyroidism, unspecified: Secondary | ICD-10-CM | POA: Diagnosis not present

## 2016-11-04 DIAGNOSIS — Z87442 Personal history of urinary calculi: Secondary | ICD-10-CM | POA: Insufficient documentation

## 2016-11-04 HISTORY — DX: Gout, unspecified: M10.9

## 2016-11-04 LAB — COMPREHENSIVE METABOLIC PANEL
ALT: 13 U/L — ABNORMAL LOW (ref 17–63)
ANION GAP: 3 — AB (ref 5–15)
AST: 17 U/L (ref 15–41)
Albumin: 3.8 g/dL (ref 3.5–5.0)
Alkaline Phosphatase: 54 U/L (ref 38–126)
BUN: 36 mg/dL — ABNORMAL HIGH (ref 6–20)
CHLORIDE: 110 mmol/L (ref 101–111)
CO2: 23 mmol/L (ref 22–32)
Calcium: 9 mg/dL (ref 8.9–10.3)
Creatinine, Ser: 2.44 mg/dL — ABNORMAL HIGH (ref 0.61–1.24)
GFR calc non Af Amer: 23 mL/min — ABNORMAL LOW (ref >60)
GFR, EST AFRICAN AMERICAN: 27 mL/min — AB (ref >60)
GLUCOSE: 108 mg/dL — AB (ref 65–99)
Potassium: 6.4 mmol/L (ref 3.5–5.1)
Sodium: 136 mmol/L (ref 135–145)
Total Bilirubin: 0.4 mg/dL (ref 0.3–1.2)
Total Protein: 7.4 g/dL (ref 6.5–8.1)

## 2016-11-04 LAB — URINALYSIS, COMPLETE (UACMP) WITH MICROSCOPIC
Bilirubin Urine: NEGATIVE
GLUCOSE, UA: NEGATIVE mg/dL
Hgb urine dipstick: NEGATIVE
KETONES UR: NEGATIVE mg/dL
Nitrite: NEGATIVE
PH: 6 (ref 5.0–8.0)
Protein, ur: NEGATIVE mg/dL
Specific Gravity, Urine: 1.015 (ref 1.005–1.030)

## 2016-11-04 LAB — VALPROIC ACID LEVEL: Valproic Acid Lvl: 60 ug/mL (ref 50.0–100.0)

## 2016-11-04 LAB — CBC WITH DIFFERENTIAL/PLATELET
Basophils Absolute: 0 10*3/uL (ref 0–0.1)
Basophils Relative: 0 %
Eosinophils Absolute: 0.1 10*3/uL (ref 0–0.7)
Eosinophils Relative: 1 %
HEMATOCRIT: 30.8 % — AB (ref 40.0–52.0)
HEMOGLOBIN: 10.3 g/dL — AB (ref 13.0–18.0)
LYMPHS ABS: 1.2 10*3/uL (ref 1.0–3.6)
LYMPHS PCT: 17 %
MCH: 29.2 pg (ref 26.0–34.0)
MCHC: 33.3 g/dL (ref 32.0–36.0)
MCV: 87.7 fL (ref 80.0–100.0)
MONO ABS: 0.8 10*3/uL (ref 0.2–1.0)
MONOS PCT: 11 %
NEUTROS ABS: 4.7 10*3/uL (ref 1.4–6.5)
NEUTROS PCT: 71 %
Platelets: 181 10*3/uL (ref 150–440)
RBC: 3.51 MIL/uL — ABNORMAL LOW (ref 4.40–5.90)
RDW: 15.1 % — ABNORMAL HIGH (ref 11.5–14.5)
WBC: 6.8 10*3/uL (ref 3.8–10.6)

## 2016-11-04 LAB — GLUCOSE, CAPILLARY: GLUCOSE-CAPILLARY: 203 mg/dL — AB (ref 65–99)

## 2016-11-04 MED ORDER — GLUCOSE BLOOD VI STRP
1.0000 | ORAL_STRIP | Freq: Three times a day (TID) | Status: DC
  Administered 2016-11-04: 1
  Filled 2016-11-04 (×4): qty 1

## 2016-11-04 MED ORDER — FEBUXOSTAT 40 MG PO TABS
40.0000 mg | ORAL_TABLET | Freq: Every day | ORAL | Status: DC
  Administered 2016-11-05: 40 mg via ORAL
  Filled 2016-11-04: qty 1

## 2016-11-04 MED ORDER — ONDANSETRON HCL 4 MG/2ML IJ SOLN: 4.0000 mg | Freq: Four times a day (QID) | INTRAMUSCULAR | Status: DC | PRN

## 2016-11-04 MED ORDER — DIVALPROEX SODIUM 125 MG PO CSDR
250.0000 mg | DELAYED_RELEASE_CAPSULE | Freq: Every day | ORAL | Status: DC
  Administered 2016-11-05: 250 mg via ORAL
  Filled 2016-11-04: qty 2

## 2016-11-04 MED ORDER — FERROUS SULFATE 325 (65 FE) MG PO TABS
325.0000 mg | ORAL_TABLET | Freq: Every day | ORAL | Status: DC
  Administered 2016-11-05: 325 mg via ORAL
  Filled 2016-11-04: qty 1

## 2016-11-04 MED ORDER — HEPARIN SODIUM (PORCINE) 5000 UNIT/ML IJ SOLN
5000.0000 [IU] | Freq: Three times a day (TID) | INTRAMUSCULAR | Status: DC
  Administered 2016-11-04 – 2016-11-05 (×2): 5000 [IU] via SUBCUTANEOUS
  Filled 2016-11-04 (×2): qty 1

## 2016-11-04 MED ORDER — DONEPEZIL HCL 5 MG PO TABS
5.0000 mg | ORAL_TABLET | Freq: Every day | ORAL | Status: DC
  Administered 2016-11-04: 5 mg via ORAL
  Filled 2016-11-04: qty 1

## 2016-11-04 MED ORDER — INSULIN GLARGINE 100 UNIT/ML ~~LOC~~ SOLN
50.0000 [IU] | Freq: Every day | SUBCUTANEOUS | Status: DC
  Administered 2016-11-05: 50 [IU] via SUBCUTANEOUS
  Filled 2016-11-04: qty 0.5

## 2016-11-04 MED ORDER — INSULIN ASPART 100 UNIT/ML ~~LOC~~ SOLN
0.0000 [IU] | Freq: Every day | SUBCUTANEOUS | Status: DC
  Administered 2016-11-04: 2 [IU] via SUBCUTANEOUS
  Filled 2016-11-04: qty 2

## 2016-11-04 MED ORDER — SODIUM BICARBONATE 650 MG PO TABS
1300.0000 mg | ORAL_TABLET | Freq: Three times a day (TID) | ORAL | Status: DC
  Administered 2016-11-04 – 2016-11-05 (×2): 1300 mg via ORAL
  Filled 2016-11-04 (×2): qty 2

## 2016-11-04 MED ORDER — INSULIN ASPART 100 UNIT/ML ~~LOC~~ SOLN: 0.0000 [IU] | Freq: Three times a day (TID) | SUBCUTANEOUS | Status: DC

## 2016-11-04 MED ORDER — GABAPENTIN 100 MG PO CAPS
200.0000 mg | ORAL_CAPSULE | Freq: Two times a day (BID) | ORAL | Status: DC
  Administered 2016-11-04 – 2016-11-05 (×2): 200 mg via ORAL
  Filled 2016-11-04 (×2): qty 2

## 2016-11-04 MED ORDER — LEVETIRACETAM 500 MG PO TABS
500.0000 mg | ORAL_TABLET | Freq: Two times a day (BID) | ORAL | Status: DC
  Administered 2016-11-04 – 2016-11-05 (×2): 500 mg via ORAL
  Filled 2016-11-04 (×2): qty 1

## 2016-11-04 MED ORDER — ONDANSETRON HCL 4 MG PO TABS: 4.0000 mg | ORAL_TABLET | Freq: Four times a day (QID) | ORAL | Status: DC | PRN

## 2016-11-04 MED ORDER — INSULIN ASPART 100 UNIT/ML ~~LOC~~ SOLN
25.0000 [IU] | Freq: Three times a day (TID) | SUBCUTANEOUS | Status: DC
  Administered 2016-11-05: 25 [IU] via SUBCUTANEOUS
  Filled 2016-11-04: qty 25

## 2016-11-04 MED ORDER — ACETAMINOPHEN 650 MG RE SUPP: 650.0000 mg | Freq: Four times a day (QID) | RECTAL | Status: DC | PRN

## 2016-11-04 MED ORDER — FUROSEMIDE 20 MG PO TABS
20.0000 mg | ORAL_TABLET | ORAL | Status: DC
  Administered 2016-11-05: 20 mg via ORAL
  Filled 2016-11-04: qty 1

## 2016-11-04 MED ORDER — VALPROIC ACID 250 MG PO CAPS
250.0000 mg | ORAL_CAPSULE | Freq: Two times a day (BID) | ORAL | Status: DC
Start: 2016-11-04 — End: 2016-11-05
  Administered 2016-11-05 (×2): 250 mg via ORAL
  Filled 2016-11-04 (×3): qty 1

## 2016-11-04 MED ORDER — GUAIFENESIN-DM 100-10 MG/5ML PO SYRP
5.0000 mL | ORAL_SOLUTION | ORAL | Status: DC | PRN
  Administered 2016-11-05 (×2): 5 mL via ORAL
  Filled 2016-11-04 (×2): qty 5

## 2016-11-04 MED ORDER — LEVOTHYROXINE SODIUM 75 MCG PO TABS
75.0000 ug | ORAL_TABLET | Freq: Every day | ORAL | Status: DC
  Administered 2016-11-05: 75 ug via ORAL
  Filled 2016-11-04: qty 1

## 2016-11-04 MED ORDER — ACETAMINOPHEN 325 MG PO TABS: 650.0000 mg | ORAL_TABLET | Freq: Four times a day (QID) | ORAL | Status: DC | PRN

## 2016-11-04 MED ORDER — SODIUM BICARBONATE 8.4 % IV SOLN
50.0000 meq | Freq: Once | INTRAVENOUS | Status: AC
  Administered 2016-11-04: 50 meq via INTRAVENOUS
  Filled 2016-11-04: qty 50

## 2016-11-04 MED ORDER — MAGNESIUM OXIDE 400 (241.3 MG) MG PO TABS
400.0000 mg | ORAL_TABLET | Freq: Every day | ORAL | Status: DC
Start: 2016-11-05 — End: 2016-11-05
  Administered 2016-11-05: 400 mg via ORAL
  Filled 2016-11-04: qty 1

## 2016-11-04 MED ORDER — SODIUM POLYSTYRENE SULFONATE 15 GM/60ML PO SUSP
30.0000 g | Freq: Once | ORAL | Status: AC
  Administered 2016-11-04: 30 g via ORAL
  Filled 2016-11-04: qty 120

## 2016-11-04 MED ORDER — SODIUM CHLORIDE 0.9% FLUSH
3.0000 mL | Freq: Two times a day (BID) | INTRAVENOUS | Status: DC
  Administered 2016-11-04 – 2016-11-05 (×2): 3 mL via INTRAVENOUS

## 2016-11-04 MED ORDER — FOLIC ACID 1 MG PO TABS
1.0000 mg | ORAL_TABLET | Freq: Every day | ORAL | Status: DC
  Administered 2016-11-05: 1 mg via ORAL
  Filled 2016-11-04: qty 1

## 2016-11-04 NOTE — ED Notes (Signed)
Patient given dinner tray and water. Patient expresses no other needs at this time. Will continue to monitor.

## 2016-11-04 NOTE — ED Provider Notes (Signed)
[facility] Emergency Department Provider Note   ____________________________________________   First MD Initiated Contact with Patient 11/04/16 1610     (approximate)  I have reviewed the triage vital signs and the nursing notes.   HISTORY  Chief Complaint Flank Pain    HPI Phillip Osborne is a 83 y.o. male who complains of left flank pain in the left paraspinous area and radiating around to his groin for about a week. His been getting worse. He feels something like when he's had his previous kidney stones. He had lithotripsy for the last one. He got some Toradol with EMS and were waiting to see how the pain does right now. It has been quite bad in the past. Nothing seems to make it better or worse. He did have some dysuria with it. No other abdominal pain.  Past Medical History:  Diagnosis Date  . Diabetes mellitus without complication (HCC)   . Gout   . Hypertension   . Kidney stone   . Seizures (HCC)   . Thyroid disease     Patient Active Problem List   Diagnosis Date Noted  . Left ureteral stone 02/20/2016  . Gross hematuria 02/20/2016    Past Surgical History:  Procedure Laterality Date  . EXTRACORPOREAL SHOCK WAVE LITHOTRIPSY Left 02/22/2016   Procedure: EXTRACORPOREAL SHOCK WAVE LITHOTRIPSY (ESWL);  Surgeon: Hildred Laser, MD;  Location: [facility] ORS;  Service: Urology;  Laterality: Left;  . HERNIA REPAIR  70's    Prior to Admission medications   Medication Sig Start Date End Date Taking? Authorizing Provider  calcitRIOL (ROCALTROL) 0.25 MCG capsule Take by mouth.    Historical Provider, MD  donepezil (ARICEPT) 5 MG tablet Take 5 mg by mouth at bedtime.    Historical Provider, MD  febuxostat (ULORIC) 40 MG tablet Take 40 mg by mouth daily.    Historical Provider, MD  ferrous sulfate 325 (65 FE) MG EC tablet Take by mouth.    Historical Provider, MD  ferrous sulfate 325 (65 FE) MG tablet Take 325 mg by mouth daily.    Historical  Provider, MD  folic acid (FOLVITE) 1 MG tablet Take 1 mg by mouth daily.    Historical Provider, MD  furosemide (LASIX) 20 MG tablet Take 20 mg by mouth every morning.    Historical Provider, MD  glucose blood (ACCU-CHEK AVIVA) test strip 1 each by Other route 3 (three) times daily. (Fasting and 2 hours after largest meal)    Historical Provider, MD  insulin glargine (LANTUS) 100 UNIT/ML injection Inject 45 Units into the skin at bedtime.    Historical Provider, MD  insulin lispro (HUMALOG) 100 UNIT/ML injection Inject 5 Units into the skin 3 (three) times daily before meals. (Hold if CBG is less than 100)    Historical Provider, MD  levothyroxine (SYNTHROID, LEVOTHROID) 25 MCG tablet Take 50 mcg by mouth every Monday, Wednesday, and Friday. Alternating with 25 mcg    Historical Provider, MD  levothyroxine (SYNTHROID, LEVOTHROID) 25 MCG tablet Take 25 mcg by mouth every Tuesday, Thursday, Saturday, and Sunday. Alternating with 50 mcg    Historical Provider, MD  magnesium oxide (MAG-OX) 400 MG tablet Take 400 mg by mouth daily.    Historical Provider, MD  oxyCODONE-acetaminophen (PERCOCET) 10-325 MG tablet Take 1 tablet by mouth every 4 (four) hours as needed for pain.    Historical Provider, MD  sodium bicarbonate 650 MG tablet Take 1,300 mg by mouth 3 (three) times daily.  Historical Provider, MD  tamsulosin (FLOMAX) 0.4 MG CAPS capsule Take 1 capsule (0.4 mg total) by mouth daily. 02/13/16   Rebecka Apley, MD  UNABLE TO FIND Sodium Bicarb 10 grain tablet- 2 tablets PO TID    Historical Provider, MD  Valproic Acid 250 MG CPDR Take 250 mg by mouth 2 (two) times daily.    Historical Provider, MD    Allergies Enalapril and Lisinopril  Family History  Problem Relation Age of Onset  . Kidney disease Neg Hx   . Prostate cancer Neg Hx     Social History Social History  Substance Use Topics  . Smoking status: Never Smoker  . Smokeless tobacco: Never Used  . Alcohol use No    Review of  Systems Constitutional: No fever/chills Eyes: No visual changes. ENT: No sore throat. Cardiovascular: Denies chest pain. Respiratory: Denies shortness of breath. Gastrointestinal: See history of present illness Genitourinary:dysuria. Musculoskeletal: Negative for back pain. Skin: Negative for rash.   10-point ROS otherwise negative.  ____________________________________________   PHYSICAL EXAM:  VITAL SIGNS: ED Triage Vitals  Enc Vitals Group     BP 11/04/16 1556 (!) 116/91     Pulse Rate 11/04/16 1556 87     Resp --      Temp 11/04/16 1556 98.9 F (37.2 C)     Temp Source 11/04/16 1556 Oral     SpO2 11/04/16 1556 96 %     Weight 11/04/16 1557 160 lb (72.6 kg)     Height 11/04/16 1557 5\' 5"  (1.651 m)     Head Circumference --      Peak Flow --      Pain Score 11/04/16 1557 9     Pain Loc --      Pain Edu? --      Excl. in GC? --     Constitutional: Alert and oriented. Well appearing and in no acute distress. Eyes: Conjunctivae are normal. PERRL. EOMI. Head: Atraumatic. Nose: No congestion/rhinnorhea. Mouth/Throat: Mucous membranes are moist.  Oropharynx non-erythematous. Neck: No stridor.  Cardiovascular: Normal rate, regular rhythm. Grossly normal heart sounds.  Good peripheral circulation. Respiratory: Normal respiratory effort.  No retractions. Lungs CTAB. Gastrointestinal: Soft Slight tenderness nor right lower quadrant. No distention. No abdominal bruits. No CVA tenderness. Musculoskeletal: No lower extremity tenderness nor edema.  No joint effusions. Neurologic:  Normal speech and language. No gross focal neurologic deficits are appreciated. No gait instability. Skin:  Skin is warm, dry and intact. No rash noted.  ____________________________________________   LABS (all labs ordered are listed, but only abnormal results are displayed)  Labs Reviewed  URINALYSIS, COMPLETE (UACMP) WITH MICROSCOPIC - Abnormal; Notable for the following:       Result Value    Color, Urine STRAW (*)    APPearance CLEAR (*)    Leukocytes, UA SMALL (*)    Bacteria, UA RARE (*)    Squamous Epithelial / LPF 0-5 (*)    All other components within normal limits  COMPREHENSIVE METABOLIC PANEL - Abnormal; Notable for the following:    Potassium 6.4 (*)    Glucose, Bld 108 (*)    BUN 36 (*)    Creatinine, Ser 2.44 (*)    ALT 13 (*)    GFR calc non Af Amer 23 (*)    GFR calc Af Amer 27 (*)    Anion gap 3 (*)    All other components within normal limits  CBC WITH DIFFERENTIAL/PLATELET - Abnormal; Notable for the following:  RBC 3.51 (*)    Hemoglobin 10.3 (*)    HCT 30.8 (*)    RDW 15.1 (*)    All other components within normal limits  VALPROIC ACID LEVEL   ____________________________________________  EKG   ____________________________________________  RADIOLOGY  Study Result   CLINICAL DATA:  Right groin and flank pain for 1 week. History kidney stones. Dysuria. Prior hernia repair.  EXAM: CT ABDOMEN AND PELVIS WITHOUT CONTRAST  TECHNIQUE: Multidetector CT imaging of the abdomen and pelvis was performed following the standard protocol without IV contrast.  COMPARISON:  02/13/2016 CT.  Renal ultrasound of 05/27/2016.  FINDINGS: Lower chest: Emphysema at the lung bases. Left base opacity is new since the prior exam, including on image 14/series 4. Mild cardiomegaly. Mild bilateral gynecomastia.  Hepatobiliary: Normal liver. Normal gallbladder, without biliary ductal dilatation.  Pancreas: Normal, without mass or ductal dilatation.  Spleen: Normal in size, without focal abnormality.  Adrenals/Urinary Tract: Normal adrenal glands. Mild renal cortical thinning bilaterally. Lower pole 2.7 cm right renal cyst. A lower pole 14 mm left renal low-density lesion is likely a cyst.  Posterior interpolar left renal lesion measures 2.8 cm and minimally greater than fluid density. This is present back to 04/27/13, where it measured 2.2  cm.  Punctate right renal collecting system calculus, without hydronephrosis or hydroureter. Moderately thickened bladder wall with mild pericystic edema. Left-sided bladder base stone measures 5 mm.  Stomach/Bowel: Gastric diverticulum again identified posteriorly. Otherwise normal stomach. Normal colon, appendix, and terminal ileum. Normal small bowel.  Vascular/Lymphatic: Aortic and branch vessel atherosclerosis. No abdominopelvic adenopathy.  Reproductive: Mild prostatomegaly.  Other: No significant free fluid.  Musculoskeletal: No acute osseous abnormality.  IMPRESSION: 1. Right nephrolithiasis, without obstructive uropathy. 2. Left-sided bladder stone. 3. Prostatomegaly. Concurrent bladder wall thickening and mild pericystic edema. Findings are grossly similar to 02/13/2016. The bladder findings could be related to bladder outlet obstruction or cystitis. 4. Bilateral renal low-density lesions are likely cysts. An interpolar left renal lesion measures slightly greater than fluid density, and has enlarged minimally since 04/27/2013. Favor a complex cyst. Technically indeterminate. Consider definitive characterization with pre and post contrast abdominal MRI. As an interpolar left renal lesion in this region appeared simple on the prior ultrasound (favored to represent the same lesion) ultrasound surveillance at 6-12 months would be a reasonable strategy. 5. New left base opacity since 02/13/2016. Favored to represent an area of infection or aspiration. Correlate with chest symptoms. Consider chest CT followup at 3- 6 months to confirm stability or resolution.   Electronically Signed   By: Jeronimo GreavesKyle  Talbot M.D.   On: 11/04/2016 17:23    Study Result   CLINICAL DATA:  Right groin and flank pain for 1 week. Burning with urination. Weakness today.  EXAM: CHEST  2 VIEW  COMPARISON:  Chest x-rays dated 05/04/2016, 04/14/2014  and 04/03/2014.  FINDINGS: Heart size and mediastinal contours are stable. Lungs are clear. No pleural effusion or pneumothorax seen. Osseous structures about the chest are unremarkable. The  IMPRESSION: No active cardiopulmonary disease. No evidence of pneumonia or pulmonary edema.   Electronically Signed   By: Bary RichardStan  Maynard M.D.   On: 11/04/2016 18:23     ____________________________________________   PROCEDURES  Procedure(s) performed:   Procedures  Critical Care performed:   ____________________________________________   INITIAL IMPRESSION / ASSESSMENT AND PLAN / ED COURSE  Pertinent labs & imaging results that were available during my care of the patient were reviewed by me and considered in my medical decision  making (see chart for details).        ____________________________________________   FINAL CLINICAL IMPRESSION(S) / ED DIAGNOSES  Final diagnoses:  Pain  Flank pain  Hyperkalemia    Patient also has the left lung base density which will require follow-up it does not show up on chest x-ray  NEW MEDICATIONS STARTED DURING THIS VISIT:  New Prescriptions   No medications on file     Note:  This document was prepared using Dragon voice recognition software and may include unintentional dictation errors.    Arnaldo Natal, MD 11/04/16 [phone]

## 2016-11-04 NOTE — ED Triage Notes (Signed)
[facility] of right groin and flank pain x 1 week. History of kidney stones with similar symptoms. Denies N/V. Denies fever. Reports burning with urination.

## 2016-11-04 NOTE — H&P (Signed)
[facility] - [place] at [facility]   PATIENT NAME: Phillip Osborne    MR#:  [id number]  DATE OF BIRTH:  01/30/1932  DATE OF ADMISSION:  11/04/2016  PRIMARY CARE PHYSICIAN: Marisue IvanLINTHAVONG, KANHKA, MD   REQUESTING/REFERRING PHYSICIAN: Dr. Dorothea GlassmanPaul Malinda  CHIEF COMPLAINT:   Chief Complaint  Patient presents with  . Flank Pain    HISTORY OF PRESENT ILLNESS:  Phillip Osborne  is a 83 y.o. male with a known history of Diabetes type 2 without complication, hypertension, history of renal stones, history of seizures who presents to the hospital due to right flank pain radiating to the groin. Patient says that he's been having right flank pain radiating to the groin ongoing for the past week or 2 weeks but today it was much worse and therefore he came to the ER for further evaluation. Patient underwent a CT scan of the abdomen and pelvis which showed no nephrolithiasis. Incidentally he was noted to be severely hyperkalemic with a 6.4. He had no acute EKG changes. Hospitalist services were contacted for admission. Patient denies any chest pain, shortness of breath, nausea, vomiting, fever, chills, melena, hematochezia or any other associated symptoms presently.  PAST MEDICAL HISTORY:   Past Medical History:  Diagnosis Date  . Diabetes mellitus without complication (HCC)   . Gout   . Hypertension   . Kidney stone   . Seizures (HCC)   . Thyroid disease     PAST SURGICAL HISTORY:   Past Surgical History:  Procedure Laterality Date  . EXTRACORPOREAL SHOCK WAVE LITHOTRIPSY Left 02/22/2016   Procedure: EXTRACORPOREAL SHOCK WAVE LITHOTRIPSY (ESWL);  Surgeon: Hildred LaserBrian James Budzyn, MD;  Location: [facility] ORS;  Service: Urology;  Laterality: Left;  . HERNIA REPAIR  70's    SOCIAL HISTORY:   Social History  Substance Use Topics  . Smoking status: Never Smoker  . Smokeless tobacco: Never Used  . Alcohol use No    FAMILY HISTORY:   Family History  Problem Relation Age of Onset   . Cancer Mother   . Diabetes Sister   . Kidney disease Neg Hx   . Prostate cancer Neg Hx     DRUG ALLERGIES:   Allergies  Allergen Reactions  . Enalapril Swelling    Face swelling.  . Lisinopril Swelling    REVIEW OF SYSTEMS:   Review of Systems  Constitutional: Negative for fever and weight loss.  HENT: Negative for congestion, nosebleeds and tinnitus.   Eyes: Negative for blurred vision, double vision and redness.  Respiratory: Negative for cough, hemoptysis and shortness of breath.   Cardiovascular: Negative for chest pain, orthopnea, leg swelling and PND.  Gastrointestinal: Positive for abdominal pain (Right Flank). Negative for diarrhea, melena, nausea and vomiting.  Genitourinary: Negative for dysuria, hematuria and urgency.  Musculoskeletal: Negative for falls and joint pain.  Neurological: Negative for dizziness, tingling, sensory change, focal weakness, seizures, weakness and headaches.  Endo/Heme/Allergies: Negative for polydipsia. Does not bruise/bleed easily.  Psychiatric/Behavioral: Negative for depression and memory loss. The patient is not nervous/anxious.     MEDICATIONS AT HOME:   Prior to Admission medications   Medication Sig Start Date End Date Taking? Authorizing Provider  divalproex (DEPAKOTE SPRINKLE) 125 MG capsule Take 250 mg by mouth at bedtime. 02/20/16  Yes Historical Provider, MD  donepezil (ARICEPT) 5 MG tablet Take 5 mg by mouth at bedtime.   Yes Historical Provider, MD  febuxostat (ULORIC) 40 MG tablet Take 40 mg by mouth daily.   Yes Historical Provider,  MD  ferrous sulfate 325 (65 FE) MG tablet Take 325 mg by mouth daily.   Yes Historical Provider, MD  folic acid (FOLVITE) 1 MG tablet Take 1 mg by mouth daily.   Yes Historical Provider, MD  furosemide (LASIX) 20 MG tablet Take 20 mg by mouth every morning.   Yes Historical Provider, MD  gabapentin (NEURONTIN) 100 MG capsule Take 200 mg by mouth 2 (two) times daily. 10/08/16 11/07/16 Yes  Historical Provider, MD  glucose blood (ACCU-CHEK AVIVA) test strip 1 each by Other route 3 (three) times daily. (Fasting and 2 hours after largest meal)   Yes Historical Provider, MD  insulin glargine (LANTUS) 100 UNIT/ML injection Inject 50 Units into the skin daily.    Yes Historical Provider, MD  insulin lispro (HUMALOG) 100 UNIT/ML injection Inject 25 Units into the skin 3 (three) times daily before meals. (Hold if CBG is less than 100)   Yes Historical Provider, MD  levETIRAcetam (KEPPRA) 500 MG tablet Take 500 mg by mouth 2 (two) times daily. 08/06/16 02/02/17 Yes Historical Provider, MD  levothyroxine (SYNTHROID, LEVOTHROID) 75 MCG tablet Take 75 mcg by mouth daily.    Yes Historical Provider, MD  magnesium oxide (MAG-OX) 400 MG tablet Take 400 mg by mouth daily.   Yes Historical Provider, MD  sodium bicarbonate 650 MG tablet Take 1,300 mg by mouth 3 (three) times daily.    Yes Historical Provider, MD  Valproic Acid 250 MG CPDR Take 250 mg by mouth 2 (two) times daily.   Yes Historical Provider, MD  sodium bicarbonate 650 MG tablet Take 1,300 mg by mouth 3 (three) times daily.    Historical Provider, MD      VITAL SIGNS:  Blood pressure 133/80, pulse 65, temperature 98.9 F (37.2 C), temperature source Oral, resp. rate 16, height 5\' 5"  (1.651 m), weight 72.6 kg (160 lb), SpO2 99 %.  PHYSICAL EXAMINATION:  Physical Exam  GENERAL:  83 y.o.-year-old patient lying in the bed in no acute distress.  EYES: Pupils equal, round, reactive to light and accommodation. No scleral icterus. Extraocular muscles intact.  HEENT: Head atraumatic, normocephalic. Oropharynx and nasopharynx clear. No oropharyngeal erythema, moist oral mucosa  NECK:  Supple, no jugular venous distention. No thyroid enlargement, no tenderness.  LUNGS: Normal breath sounds bilaterally, no wheezing, rales, rhonchi. No use of accessory muscles of respiration.  CARDIOVASCULAR: S1, S2 RRR. No murmurs, rubs, gallops, clicks.   ABDOMEN: Soft, nontender, nondistended. Bowel sounds present. No organomegaly or mass.  EXTREMITIES: No pedal edema, cyanosis, or clubbing. + 2 pedal & radial pulses b/l.   NEUROLOGIC: Cranial nerves II through XII are intact. No focal Motor or sensory deficits appreciated b/l PSYCHIATRIC: The patient is alert and oriented x 3. Good affect.  SKIN: No obvious rash, lesion, or ulcer.   LABORATORY PANEL:   CBC  Recent Labs Lab 11/04/16 1730  WBC 6.8  HGB 10.3*  HCT 30.8*  PLT 181   ------------------------------------------------------------------------------------------------------------------  Chemistries   Recent Labs Lab 11/04/16 1730  NA 136  K 6.4*  CL 110  CO2 23  GLUCOSE 108*  BUN 36*  CREATININE 2.44*  CALCIUM 9.0  AST 17  ALT 13*  ALKPHOS 54  BILITOT 0.4   ------------------------------------------------------------------------------------------------------------------  Cardiac Enzymes No results for input(s): TROPONINI in the last 168 hours. ------------------------------------------------------------------------------------------------------------------  RADIOLOGY:  Dg Chest 2 View  Result Date: 11/04/2016 CLINICAL DATA:  Right groin and flank pain for 1 week. Burning with urination. Weakness today. EXAM: CHEST  2 VIEW COMPARISON:  Chest x-rays dated 05/04/2016, 04/14/2014 and 04/03/2014. FINDINGS: Heart size and mediastinal contours are stable. Lungs are clear. No pleural effusion or pneumothorax seen. Osseous structures about the chest are unremarkable. The IMPRESSION: No active cardiopulmonary disease. No evidence of pneumonia or pulmonary edema. Electronically Signed   By: Bary Richard M.D.   On: 11/04/2016 18:23   Ct Renal Stone Study  Result Date: 11/04/2016 CLINICAL DATA:  Right groin and flank pain for 1 week. History kidney stones. Dysuria. Prior hernia repair. EXAM: CT ABDOMEN AND PELVIS WITHOUT CONTRAST TECHNIQUE: Multidetector CT imaging of  the abdomen and pelvis was performed following the standard protocol without IV contrast. COMPARISON:  02/13/2016 CT.  Renal ultrasound of 05/27/2016. FINDINGS: Lower chest: Emphysema at the lung bases. Left base opacity is new since the prior exam, including on image 14/series 4. Mild cardiomegaly. Mild bilateral gynecomastia. Hepatobiliary: Normal liver. Normal gallbladder, without biliary ductal dilatation. Pancreas: Normal, without mass or ductal dilatation. Spleen: Normal in size, without focal abnormality. Adrenals/Urinary Tract: Normal adrenal glands. Mild renal cortical thinning bilaterally. Lower pole 2.7 cm right renal cyst. A lower pole 14 mm left renal low-density lesion is likely a cyst. Posterior interpolar left renal lesion measures 2.8 cm and minimally greater than fluid density. This is present back to 04/27/13, where it measured 2.2 cm. Punctate right renal collecting system calculus, without hydronephrosis or hydroureter. Moderately thickened bladder wall with mild pericystic edema. Left-sided bladder base stone measures 5 mm. Stomach/Bowel: Gastric diverticulum again identified posteriorly. Otherwise normal stomach. Normal colon, appendix, and terminal ileum. Normal small bowel. Vascular/Lymphatic: Aortic and branch vessel atherosclerosis. No abdominopelvic adenopathy. Reproductive: Mild prostatomegaly. Other: No significant free fluid. Musculoskeletal: No acute osseous abnormality. IMPRESSION: 1. Right nephrolithiasis, without obstructive uropathy. 2. Left-sided bladder stone. 3. Prostatomegaly. Concurrent bladder wall thickening and mild pericystic edema. Findings are grossly similar to 02/13/2016. The bladder findings could be related to bladder outlet obstruction or cystitis. 4. Bilateral renal low-density lesions are likely cysts. An interpolar left renal lesion measures slightly greater than fluid density, and has enlarged minimally since 04/27/2013. Favor a complex cyst. Technically  indeterminate. Consider definitive characterization with pre and post contrast abdominal MRI. As an interpolar left renal lesion in this region appeared simple on the prior ultrasound (favored to represent the same lesion) ultrasound surveillance at 6-12 months would be a reasonable strategy. 5. New left base opacity since 02/13/2016. Favored to represent an area of infection or aspiration. Correlate with chest symptoms. Consider chest CT followup at 3- 6 months to confirm stability or resolution. Electronically Signed   By: Jeronimo Greaves M.D.   On: 11/04/2016 17:23     IMPRESSION AND PLAN:   83 year old male with past medical history of diabetes, gout, previous history of nephrolithiasis, seizures, chronic kidney disease stage III who presents to the hospital due to right flank pain.  1. Hyperkalemia-was incidentally noted on the blood work on admission. Patient has no acute EKG changes or any arrhythmias. -Patient has been given some sodium bicarbonate. I will get the patient at dose of Kayexalate. Repeat potassium in a.m. Tomorrow.  2. Right flank pain-etiology unclear presently. Patient CT scan of the abdomen pelvis showing right-sided nephrolithiasis but no obstruction. He was also noted to have bilateral renal cysts. -Continue supportive care with pain control for now.  3. History of seizures-continue Depakote, Keppra.  4. Diabetes type 2 without complication-continue Lantus, NovoLog with meals.  5. Diabetic neuropathy-continue gabapentin.  6. History of gout-no acute attack.  Continue Uloric  7. Dementia without behavioral disturbance-continue Aricept.  8. Chronic kidney disease stage III-patient's creatinine is close to baseline.  9. Hypothyroidism-continue Synthroid.  10. Abnormal CT scan-patient was noted to have a left basilar opacity on CT of the abdomen and pelvis. -No acute respiratory symptoms. Patient should have a repeat CT scan of his chest done next 3-6 months for  follow-up.  All the records are reviewed and case discussed with ED provider. Management plans discussed with the patient, family and they are in agreement.  CODE STATUS: Full  TOTAL TIME TAKING CARE OF THIS PATIENT: 45 minutes.    Houston Siren M.D on 11/04/2016 at 7:17 PM  Between 7am to 6pm - Pager - [phone]  After 6pm go to [email or web address] - password [email or web address] [facility]  [place] [facility]  Office  [phone]  CC: Primary care physician; Marisue Ivan, MD

## 2016-11-05 DIAGNOSIS — E875 Hyperkalemia: Secondary | ICD-10-CM | POA: Diagnosis not present

## 2016-11-05 LAB — CBC
HCT: 30.9 % — ABNORMAL LOW (ref 40.0–52.0)
Hemoglobin: 10.3 g/dL — ABNORMAL LOW (ref 13.0–18.0)
MCH: 29.9 pg (ref 26.0–34.0)
MCHC: 33.5 g/dL (ref 32.0–36.0)
MCV: 89.3 fL (ref 80.0–100.0)
PLATELETS: 173 10*3/uL (ref 150–440)
RBC: 3.46 MIL/uL — ABNORMAL LOW (ref 4.40–5.90)
RDW: 15 % — AB (ref 11.5–14.5)
WBC: 4.8 10*3/uL (ref 3.8–10.6)

## 2016-11-05 LAB — BASIC METABOLIC PANEL
Anion gap: 7 (ref 5–15)
BUN: 39 mg/dL — AB (ref 6–20)
CHLORIDE: 112 mmol/L — AB (ref 101–111)
CO2: 23 mmol/L (ref 22–32)
CREATININE: 2.53 mg/dL — AB (ref 0.61–1.24)
Calcium: 8.8 mg/dL — ABNORMAL LOW (ref 8.9–10.3)
GFR calc Af Amer: 26 mL/min — ABNORMAL LOW (ref >60)
GFR calc non Af Amer: 22 mL/min — ABNORMAL LOW (ref >60)
GLUCOSE: 58 mg/dL — AB (ref 65–99)
Potassium: 5.1 mmol/L (ref 3.5–5.1)
SODIUM: 142 mmol/L (ref 135–145)

## 2016-11-05 LAB — GLUCOSE, CAPILLARY
GLUCOSE-CAPILLARY: 75 mg/dL (ref 65–99)
Glucose-Capillary: 186 mg/dL — ABNORMAL HIGH (ref 65–99)

## 2016-11-05 MED ORDER — PATIROMER SORBITEX CALCIUM 8.4 G PO PACK
8.4000 g | PACK | Freq: Every day | ORAL | Status: DC
  Administered 2016-11-05: 8.4 g via ORAL
  Filled 2016-11-05: qty 4

## 2016-11-05 MED ORDER — PATIROMER SORBITEX CALCIUM 8.4 G PO PACK: 8.4000 g | PACK | Freq: Every day | ORAL | 1 refills | Status: AC

## 2016-11-05 NOTE — Progress Notes (Signed)
Inpatient Diabetes Program Recommendations  AACE/ADA: New Consensus Statement on Inpatient Glycemic Control (2015)  Target Ranges:  Prepandial:   less than 140 mg/dL      Peak postprandial:   less than 180 mg/dL (1-2 hours)      Critically ill patients:  140 - 180 mg/dL   Lab Results  Component Value Date   GLUCAP 186 (H) 11/05/2016   HGBA1C 7.2 (H) 11/23/2013    Review of Glycemic Control:  Results for Phillip LandryMITCHELL, Kamar R (MRN [id number]) as of 11/05/2016 14:12  Ref. Range 11/04/2016 22:16 11/05/2016 07:53 11/05/2016 11:52  Glucose-Capillary Latest Ref Range: 65 - 99 mg/dL 782203 (H) 75 956186 (H)    Diabetes history: Type 2 diabetes Outpatient Diabetes medications: Lantus 50 units daily, Humalog 25 units tid with meals (hold if CBG<100 mg/dL) Current orders for Inpatient glycemic control:  Novolog sensitive tid with meals and HS, Lantus 50 units daily, Novolog 25 units tid with meals  Inpatient Diabetes Program Recommendations:    May consider reducing Lantus to 40 units daily and decreasing Novolog meal coverage to 12 units tid with meals while patient is in the hospital.    Thanks, Beryl MeagerJenny Ezriel Boffa, RN, BC-ADM Inpatient Diabetes Coordinator Pager [phone] (8a-5p)

## 2016-11-05 NOTE — Progress Notes (Signed)
[facility] - [place] at [facility]   PATIENT NAME: Phillip Osborne    MR#:  [id number]  DATE OF BIRTH:  06-27-1932  SUBJECTIVE:  Patient doing well today. Denies flank pain, radiating pain to the groin, and dysuria. Patient does endorse a cough and has asked for more cough medicine. Vital signs stable. No acute events overnight.   REVIEW OF SYSTEMS:   Review of Systems  Constitutional: Negative for chills, fever, malaise/fatigue and weight loss.  HENT: Negative for ear pain, hearing loss and tinnitus.   Eyes: Negative for blurred vision, double vision and photophobia.  Respiratory: Positive for cough. Negative for hemoptysis, sputum production and shortness of breath.   Cardiovascular: Negative for chest pain, palpitations and orthopnea.  Gastrointestinal: Negative for abdominal pain, heartburn, nausea and vomiting.  Genitourinary: Negative for dysuria, flank pain, frequency, hematuria and urgency.  Musculoskeletal: Negative for back pain, myalgias and neck pain.  Skin: Negative for itching and rash.  Neurological: Negative for dizziness, tingling and headaches.  Endo/Heme/Allergies: Negative for environmental allergies. Does not bruise/bleed easily.  Psychiatric/Behavioral: Negative for depression, substance abuse and suicidal ideas.   Tolerating Diet: heart health/carb modified   DRUG ALLERGIES:   Allergies  Allergen Reactions  . Enalapril Swelling    Face swelling.  . Lisinopril Swelling    VITALS:  Blood pressure 124/67, pulse (!) 59, temperature 97.7 F (36.5 C), resp. rate 17, height 5\' 5"  (1.651 m), weight 70.6 kg (155 lb 11.2 oz), SpO2 98 %.  PHYSICAL EXAMINATION:   Physical Exam  GENERAL:  83 y.o.-year-old patient lying in the bed with no acute distress.  EYES: Pupils equal, round, reactive to light and accommodation. No scleral icterus. Extraocular muscles intact.  HEENT: Head atraumatic, normocephalic. Oropharynx and nasopharynx  clear.  NECK:  Supple, no jugular venous distention. No thyroid enlargement, no tenderness.  LUNGS: Normal breath sounds bilaterally, no wheezing, rales, rhonchi. No use of accessory muscles of respiration.  CARDIOVASCULAR: S1, S2 normal. No murmurs, rubs, or gallops.  ABDOMEN: Soft, nontender, nondistended. Bowel sounds present. No organomegaly or mass. No CVA tenderness appreciated. EXTREMITIES: No cyanosis, clubbing or edema b/l.    NEUROLOGIC: Cranial nerves II through XII are intact. No focal Motor or sensory deficits b/l.   PSYCHIATRIC:  patient is alert and oriented x 3.  SKIN: No obvious rash, lesion, or ulcer.   LABORATORY PANEL:  CBC  Recent Labs Lab 11/05/16 0515  WBC 4.8  HGB 10.3*  HCT 30.9*  PLT 173    Chemistries   Recent Labs Lab 11/04/16 1730 11/05/16 0515  NA 136 142  K 6.4* 5.1  CL 110 112*  CO2 23 23  GLUCOSE 108* 58*  BUN 36* 39*  CREATININE 2.44* 2.53*  CALCIUM 9.0 8.8*  AST 17  --   ALT 13*  --   ALKPHOS 54  --   BILITOT 0.4  --    Cardiac Enzymes No results for input(s): TROPONINI in the last 168 hours. RADIOLOGY:  Dg Chest 2 View  Result Date: 11/04/2016 CLINICAL DATA:  Right groin and flank pain for 1 week. Burning with urination. Weakness today. EXAM: CHEST  2 VIEW COMPARISON:  Chest x-rays dated 05/04/2016, 04/14/2014 and 04/03/2014. FINDINGS: Heart size and mediastinal contours are stable. Lungs are clear. No pleural effusion or pneumothorax seen. Osseous structures about the chest are unremarkable. The IMPRESSION: No active cardiopulmonary disease. No evidence of pneumonia or pulmonary edema. Electronically Signed   By: Anne Ng.D.  On: 11/04/2016 18:23   Ct Renal Stone Study  Result Date: 11/04/2016 CLINICAL DATA:  Right groin and flank pain for 1 week. History kidney stones. Dysuria. Prior hernia repair. EXAM: CT ABDOMEN AND PELVIS WITHOUT CONTRAST TECHNIQUE: Multidetector CT imaging of the abdomen and pelvis was performed  following the standard protocol without IV contrast. COMPARISON:  02/13/2016 CT.  Renal ultrasound of 05/27/2016. FINDINGS: Lower chest: Emphysema at the lung bases. Left base opacity is new since the prior exam, including on image 14/series 4. Mild cardiomegaly. Mild bilateral gynecomastia. Hepatobiliary: Normal liver. Normal gallbladder, without biliary ductal dilatation. Pancreas: Normal, without mass or ductal dilatation. Spleen: Normal in size, without focal abnormality. Adrenals/Urinary Tract: Normal adrenal glands. Mild renal cortical thinning bilaterally. Lower pole 2.7 cm right renal cyst. A lower pole 14 mm left renal low-density lesion is likely a cyst. Posterior interpolar left renal lesion measures 2.8 cm and minimally greater than fluid density. This is present back to 04/27/13, where it measured 2.2 cm. Punctate right renal collecting system calculus, without hydronephrosis or hydroureter. Moderately thickened bladder wall with mild pericystic edema. Left-sided bladder base stone measures 5 mm. Stomach/Bowel: Gastric diverticulum again identified posteriorly. Otherwise normal stomach. Normal colon, appendix, and terminal ileum. Normal small bowel. Vascular/Lymphatic: Aortic and branch vessel atherosclerosis. No abdominopelvic adenopathy. Reproductive: Mild prostatomegaly. Other: No significant free fluid. Musculoskeletal: No acute osseous abnormality. IMPRESSION: 1. Right nephrolithiasis, without obstructive uropathy. 2. Left-sided bladder stone. 3. Prostatomegaly. Concurrent bladder wall thickening and mild pericystic edema. Findings are grossly similar to 02/13/2016. The bladder findings could be related to bladder outlet obstruction or cystitis. 4. Bilateral renal low-density lesions are likely cysts. An interpolar left renal lesion measures slightly greater than fluid density, and has enlarged minimally since 04/27/2013. Favor a complex cyst. Technically indeterminate. Consider definitive  characterization with pre and post contrast abdominal MRI. As an interpolar left renal lesion in this region appeared simple on the prior ultrasound (favored to represent the same lesion) ultrasound surveillance at 6-12 months would be a reasonable strategy. 5. New left base opacity since 02/13/2016. Favored to represent an area of infection or aspiration. Correlate with chest symptoms. Consider chest CT followup at 3- 6 months to confirm stability or resolution. Electronically Signed   By: Jeronimo Greaves M.D.   On: 11/04/2016 17:23   ASSESSMENT AND PLAN:  83yo male with a past medical history of diabetes, gout, nephrolithiasis, seizures and chronic kidney disease stage III. Presented with right flank pain that radiated to the groin. On CT scan, found to have non-obstructive right sided nephrolithiasis and bilateral renal cysts. Found to be hyperkalemic (6.4) with no EKG changes. Potassium levels have now normalized (5.1) with sodium bicarb treatment. Patient stable and pain controlled.  1) Hyperkalemia - without acute EKG changes or arrhythmias  - resolved with sodium bicarb treatment. Repeat K 5.1 this AM - consider starting patient on Veltassa for potassium regulation & f/u with nephrology for monitoring   2) Right flank pain - non-obstructive R sided nephrolithiasis - continue supportive care. Pain controlled.  - given robitussin for cough  3) Patient Education - education provided on adequate water intake, cutting out sodas - patient also advised to f/u for repeat chest CT in 3-6 months for left basilar opacity   Case discussed with Care Management/Social Worker. Management plans discussed with the patient, family and they are in agreement.  CODE STATUS: full   TOTAL TIME TAKING CARE OF THIS PATIENT: 30 minutes.  >50% time spent on counselling and  coordination of care  POSSIBLE D/C IN 1 DAYS, DEPENDING ON CLINICAL CONDITION.   Bethena Midgetaisy Raley Novicki,  Physician Assistant Student    This is a  Consulting civil engineerstudent note, solely for education purposes. This note was reviewed with and approved by attending preceptor.  CC: Primary care physician; Marisue IvanLINTHAVONG, KANHKA, MD

## 2016-11-05 NOTE — Care Management Obs Status (Signed)
MEDICARE OBSERVATION STATUS NOTIFICATION   Patient Details  Name: Phillip Osborne MRN: [id number] Date of Birth: [birth year]   Medicare Observation Status Notification Given:  No (admitted obs less than 24 hours)    Chapman FitchBOWEN, Einar Nolasco T, RN 11/05/2016, 2:10 PM

## 2016-11-05 NOTE — Progress Notes (Addendum)
Patient discharged to home and follow appointments the doctors office will call the patient with the appointments. Patient is alert and oriented, no acute distress noted.IV's discontinued sites to both hands clean dry and tntact.

## 2016-11-05 NOTE — Discharge Summary (Signed)
[employer] [facility] [place] at [facility]   PATIENT NAME: Phillip Osborne    MR#:  [id number]  DATE OF BIRTH:  Apr 23, 1932  DATE OF ADMISSION:  11/04/2016   ADMITTING PHYSICIAN: Houston Siren, MD  DATE OF DISCHARGE: 11/05/2016  2:28 PM  PRIMARY CARE PHYSICIAN: Marisue Ivan, MD   ADMISSION DIAGNOSIS:   Hyperkalemia [E87.5] Pain [R52] Flank pain [R10.9]  DISCHARGE DIAGNOSIS:   Active Problems:   Hyperkalemia   SECONDARY DIAGNOSIS:   Past Medical History:  Diagnosis Date  . Diabetes mellitus without complication (HCC)   . Gout   . Hypertension   . Kidney stone   . Seizures (HCC)   . Thyroid disease     HOSPITAL COURSE:   83 year old male with past medical history of diabetes, gout, previous history of nephrolithiasis, seizures, chronic kidney disease stage III who presents to the hospital due to right flank pain.   1. Right flank pain- secondary to renal colic. Patient CT scan of the abdomen pelvis showing right-sided nephrolithiasis but no obstruction. Likely passed the stone. No further pain noted. Outpatient urology f/u recommended He was also noted to have bilateral renal cysts.  2. Hyperkalemia- known CKD stage 4 and hyperkalemia, baseline potassium around 5 - received kayexalate and potassium has improved from 6.4 to 5.1 - started to [facility] and outpatient follow up with nephrology. Patient already follows with Dr. Wynelle Link  3. History of seizures-continue Depakote, Keppra.  4. Diabetes type 2 without complication-continue Lantus, insulin lispro.  5. Diabetic neuropathy-continue gabapentin.  6. History of gout-no acute attack. Continue Uloric  7. Dementia without behavioral disturbance-continue Aricept.  8. 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 Follow with Nephrology On oral sodium bicarb which can be continued  9. Hypothyroidism-continue Synthroid.  10. Abnormal CT  scan-patient was noted to have a left basilar opacity on CT of the abdomen and pelvis. -No acute respiratory symptoms. Patient should have a repeat CT scan of his chest done next 3-6 months for follow-up.  Stable and being discharged home.   DISCHARGE CONDITIONS:   Guarded  CONSULTS OBTAINED:   None  DRUG ALLERGIES:   Allergies  Allergen Reactions  . Enalapril Swelling    Face swelling.  . Lisinopril Swelling   DISCHARGE MEDICATIONS:   Allergies as of 11/05/2016      Reactions   Enalapril Swelling   Face swelling.   Lisinopril Swelling      Medication List    TAKE these medications   ACCU-CHEK AVIVA test strip Generic drug:  glucose blood 1 each by Other route 3 (three) times daily. (Fasting and 2 hours after largest meal)   divalproex 125 MG capsule Commonly known as:  DEPAKOTE SPRINKLE Take 250 mg by mouth at bedtime.   donepezil 5 MG tablet Commonly known as:  ARICEPT Take 5 mg by mouth at bedtime.   febuxostat 40 MG tablet Commonly known as:  ULORIC Take 40 mg by mouth daily.   ferrous sulfate 325 (65 FE) MG tablet Take 325 mg by mouth daily.   folic acid 1 MG tablet Commonly known as:  FOLVITE Take 1 mg by mouth daily.   furosemide 20 MG tablet Commonly known as:  LASIX Take 20 mg by mouth every morning.   gabapentin 100 MG capsule Commonly known as:  NEURONTIN Take 200 mg by mouth 2 (two) times daily.   insulin glargine 100 UNIT/ML injection Commonly known as:  LANTUS Inject 50 Units into  the skin daily.   insulin lispro 100 UNIT/ML injection Commonly known as:  HUMALOG Inject 25 Units into the skin 3 (three) times daily before meals. (Hold if CBG is less than 100)   levETIRAcetam 500 MG tablet Commonly known as:  KEPPRA Take 500 mg by mouth 2 (two) times daily.   levothyroxine 75 MCG tablet Commonly known as:  SYNTHROID, LEVOTHROID Take 75 mcg by mouth daily.   magnesium oxide 400 MG tablet Commonly known as:  MAG-OX Take 400 mg  by mouth daily.   patiromer 8.4 g packet Commonly known as:  VELTASSA Take 1 packet (8.4 g total) by mouth daily.   sodium bicarbonate 650 MG tablet Take 1,300 mg by mouth 3 (three) times daily. What changed:  Another medication with the same name was removed. Continue taking this medication, and follow the directions you see here.   Valproic Acid 250 MG Cpdr Take 250 mg by mouth 2 (two) times daily.        DISCHARGE INSTRUCTIONS:   1. PCP f/u in 1-2 weeks 2. Nephrology f/u and BMP check in 1 week  DIET:   Renal diet  ACTIVITY:   Activity as tolerated  OXYGEN:   Home Oxygen: No.  Oxygen Delivery: room air  DISCHARGE LOCATION:   home   If you experience worsening of your admission symptoms, develop shortness of breath, life threatening emergency, suicidal or homicidal thoughts you must seek medical attention immediately by calling 911 or calling your MD immediately  if symptoms less severe.  You Must read complete instructions/literature along with all the possible adverse reactions/side effects for all the Medicines you take and that have been prescribed to you. Take any new Medicines after you have completely understood and accpet all the possible adverse reactions/side effects.   Please note  You were cared for by a hospitalist during your hospital stay. If you have any questions about your discharge medications or the care you received while you were in the hospital after you are discharged, you can call the unit and asked to speak with the hospitalist on call if the hospitalist that took care of you is not available. Once you are discharged, your primary care physician will handle any further medical issues. Please note that NO REFILLS for any discharge medications will be authorized once you are discharged, as it is imperative that you return to your primary care physician (or establish a relationship with a primary care physician if you do not have one) for your  aftercare needs so that they can reassess your need for medications and monitor your lab values.    On the day of Discharge:  VITAL SIGNS:   Blood pressure 124/67, pulse (!) 59, temperature 97.7 F (36.5 C), resp. rate 17, height 5\' 5"  (1.651 m), weight 70.6 kg (155 lb 11.2 oz), SpO2 98 %.  PHYSICAL EXAMINATION:    GENERAL:  83 y.o.-year-old patient lying in the bed with no acute distress.  EYES: Pupils equal, round, reactive to light and accommodation. No scleral icterus. Extraocular muscles intact.  HEENT: Head atraumatic, normocephalic. Oropharynx and nasopharynx clear.  NECK:  Supple, no jugular venous distention. No thyroid enlargement, no tenderness.  LUNGS: Normal breath sounds bilaterally, no wheezing, rales,rhonchi or crepitation. No use of accessory muscles of respiration.  CARDIOVASCULAR: S1, S2 normal. No murmurs, rubs, or gallops.  ABDOMEN: Soft, non-tender, non-distended. Bowel sounds present. No organomegaly or mass.  EXTREMITIES: No pedal edema, cyanosis, or clubbing.  NEUROLOGIC: Cranial nerves II through  XII are intact. Muscle strength 5/5 in all extremities. Sensation intact. Gait not checked.  PSYCHIATRIC: The patient is alert and oriented x 3.  SKIN: No obvious rash, lesion, or ulcer.   DATA REVIEW:   CBC  Recent Labs Lab 11/05/16 0515  WBC 4.8  HGB 10.3*  HCT 30.9*  PLT 173    Chemistries   Recent Labs Lab 11/04/16 1730 11/05/16 0515  NA 136 142  K 6.4* 5.1  CL 110 112*  CO2 23 23  GLUCOSE 108* 58*  BUN 36* 39*  CREATININE 2.44* 2.53*  CALCIUM 9.0 8.8*  AST 17  --   ALT 13*  --   ALKPHOS 54  --   BILITOT 0.4  --      Microbiology Results  Results for orders placed or performed in visit on 04/12/16  Microscopic Examination     Status: None   Collection Time: 04/12/16 11:01 AM  Result Value Ref Range Status   WBC, UA 0-5 0 - 5 /hpf Final   RBC, UA None seen 0 - 2 /hpf Final   Epithelial Cells (non renal) 0-10 0 - 10 /hpf Final    Bacteria, UA None seen None seen/Few Final    RADIOLOGY:  Dg Chest 2 View  Result Date: 11/04/2016 CLINICAL DATA:  Right groin and flank pain for 1 week. Burning with urination. Weakness today. EXAM: CHEST  2 VIEW COMPARISON:  Chest x-rays dated 05/04/2016, 04/14/2014 and 04/03/2014. FINDINGS: Heart size and mediastinal contours are stable. Lungs are clear. No pleural effusion or pneumothorax seen. Osseous structures about the chest are unremarkable. The IMPRESSION: No active cardiopulmonary disease. No evidence of pneumonia or pulmonary edema. Electronically Signed   By: Bary Richard M.D.   On: 11/04/2016 18:23   Ct Renal Stone Study  Result Date: 11/04/2016 CLINICAL DATA:  Right groin and flank pain for 1 week. History kidney stones. Dysuria. Prior hernia repair. EXAM: CT ABDOMEN AND PELVIS WITHOUT CONTRAST TECHNIQUE: Multidetector CT imaging of the abdomen and pelvis was performed following the standard protocol without IV contrast. COMPARISON:  02/13/2016 CT.  Renal ultrasound of 05/27/2016. FINDINGS: Lower chest: Emphysema at the lung bases. Left base opacity is new since the prior exam, including on image 14/series 4. Mild cardiomegaly. Mild bilateral gynecomastia. Hepatobiliary: Normal liver. Normal gallbladder, without biliary ductal dilatation. Pancreas: Normal, without mass or ductal dilatation. Spleen: Normal in size, without focal abnormality. Adrenals/Urinary Tract: Normal adrenal glands. Mild renal cortical thinning bilaterally. Lower pole 2.7 cm right renal cyst. A lower pole 14 mm left renal low-density lesion is likely a cyst. Posterior interpolar left renal lesion measures 2.8 cm and minimally greater than fluid density. This is present back to 04/27/13, where it measured 2.2 cm. Punctate right renal collecting system calculus, without hydronephrosis or hydroureter. Moderately thickened bladder wall with mild pericystic edema. Left-sided bladder base stone measures 5 mm. Stomach/Bowel:  Gastric diverticulum again identified posteriorly. Otherwise normal stomach. Normal colon, appendix, and terminal ileum. Normal small bowel. Vascular/Lymphatic: Aortic and branch vessel atherosclerosis. No abdominopelvic adenopathy. Reproductive: Mild prostatomegaly. Other: No significant free fluid. Musculoskeletal: No acute osseous abnormality. IMPRESSION: 1. Right nephrolithiasis, without obstructive uropathy. 2. Left-sided bladder stone. 3. Prostatomegaly. Concurrent bladder wall thickening and mild pericystic edema. Findings are grossly similar to 02/13/2016. The bladder findings could be related to bladder outlet obstruction or cystitis. 4. Bilateral renal low-density lesions are likely cysts. An interpolar left renal lesion measures slightly greater than fluid density, and has enlarged minimally since 04/27/2013. Favor  a complex cyst. Technically indeterminate. Consider definitive characterization with pre and post contrast abdominal MRI. As an interpolar left renal lesion in this region appeared simple on the prior ultrasound (favored to represent the same lesion) ultrasound surveillance at 6-12 months would be a reasonable strategy. 5. New left base opacity since 02/13/2016. Favored to represent an area of infection or aspiration. Correlate with chest symptoms. Consider chest CT followup at 3- 6 months to confirm stability or resolution. Electronically Signed   By: Jeronimo GreavesKyle  Talbot M.D.   On: 11/04/2016 17:23     Management plans discussed with the patient, family and they are in agreement.  CODE STATUS:     Code Status Orders        Start     Ordered   11/04/16 2209  Full code  Continuous     11/04/16 2208    Code Status History    Date Active Date Inactive Code Status Order ID Comments User Context   This patient has a current code status but no historical code status.      TOTAL TIME TAKING CARE OF THIS PATIENT: 37  minutes.    Makylah Bossard M.D on 11/05/2016 at 5:00  PM  Between 7am to 6pm - Pager - [phone]  After 6pm go to [email or web address] - Social research officer, governmentpassword [email or web address]  [facility] [place] [facility]  Office  [phone]  CC: Primary care physician; Marisue IvanLINTHAVONG, KANHKA, MD   Note: This dictation was prepared with Dragon dictation along with smaller phrase technology. Any transcriptional errors that result from this process are unintentional.

## 2016-11-08 ENCOUNTER — Other Ambulatory Visit: Payer: Self-pay | Admitting: Family Medicine

## 2016-11-08 DIAGNOSIS — R918 Other nonspecific abnormal finding of lung field: Secondary | ICD-10-CM

## 2016-11-24 ENCOUNTER — Emergency Department
Admission: EM | Admit: 2016-11-24 | Discharge: 2016-11-24 | Disposition: A | Discharge status: Home / Self Care | Payer: Medicare Other | Attending: Emergency Medicine | Admitting: Emergency Medicine

## 2016-11-24 ENCOUNTER — Emergency Department: Payer: Medicare Other

## 2016-11-24 ENCOUNTER — Encounter: Payer: Self-pay | Admitting: Medical Oncology

## 2016-11-24 DIAGNOSIS — I10 Essential (primary) hypertension: Secondary | ICD-10-CM | POA: Diagnosis not present

## 2016-11-24 DIAGNOSIS — E119 Type 2 diabetes mellitus without complications: Secondary | ICD-10-CM | POA: Insufficient documentation

## 2016-11-24 DIAGNOSIS — Z794 Long term (current) use of insulin: Secondary | ICD-10-CM | POA: Insufficient documentation

## 2016-11-24 DIAGNOSIS — N390 Urinary tract infection, site not specified: Secondary | ICD-10-CM | POA: Diagnosis not present

## 2016-11-24 DIAGNOSIS — R109 Unspecified abdominal pain: Secondary | ICD-10-CM

## 2016-11-24 DIAGNOSIS — Z79899 Other long term (current) drug therapy: Secondary | ICD-10-CM | POA: Diagnosis not present

## 2016-11-24 LAB — URINALYSIS, COMPLETE (UACMP) WITH MICROSCOPIC
BILIRUBIN URINE: NEGATIVE
Glucose, UA: NEGATIVE mg/dL
Hgb urine dipstick: NEGATIVE
KETONES UR: NEGATIVE mg/dL
Nitrite: NEGATIVE
Protein, ur: NEGATIVE mg/dL
SPECIFIC GRAVITY, URINE: 1.015 (ref 1.005–1.030)
pH: 6 (ref 5.0–8.0)

## 2016-11-24 LAB — CBC
HCT: 33.3 % — ABNORMAL LOW (ref 40.0–52.0)
Hemoglobin: 11.2 g/dL — ABNORMAL LOW (ref 13.0–18.0)
MCH: 29.3 pg (ref 26.0–34.0)
MCHC: 33.7 g/dL (ref 32.0–36.0)
MCV: 86.7 fL (ref 80.0–100.0)
PLATELETS: 270 10*3/uL (ref 150–440)
RBC: 3.84 MIL/uL — AB (ref 4.40–5.90)
RDW: 14.7 % — ABNORMAL HIGH (ref 11.5–14.5)
WBC: 4.3 10*3/uL (ref 3.8–10.6)

## 2016-11-24 LAB — BASIC METABOLIC PANEL
ANION GAP: 10 (ref 5–15)
BUN: 51 mg/dL — ABNORMAL HIGH (ref 6–20)
CHLORIDE: 108 mmol/L (ref 101–111)
CO2: 22 mmol/L (ref 22–32)
CREATININE: 2.7 mg/dL — AB (ref 0.61–1.24)
Calcium: 9.5 mg/dL (ref 8.9–10.3)
GFR calc non Af Amer: 20 mL/min — ABNORMAL LOW (ref >60)
GFR, EST AFRICAN AMERICAN: 24 mL/min — AB (ref >60)
Glucose, Bld: 96 mg/dL (ref 65–99)
Potassium: 4.7 mmol/L (ref 3.5–5.1)
SODIUM: 140 mmol/L (ref 135–145)

## 2016-11-24 MED ORDER — OXYCODONE-ACETAMINOPHEN 5-325 MG PO TABS
1.0000 | ORAL_TABLET | Freq: Once | ORAL | Status: AC
  Administered 2016-11-24: 1 via ORAL
  Filled 2016-11-24: qty 1

## 2016-11-24 MED ORDER — CEPHALEXIN 500 MG PO CAPS: 500.0000 mg | ORAL_CAPSULE | Freq: Two times a day (BID) | ORAL | 0 refills | Status: DC

## 2016-11-24 NOTE — ED Provider Notes (Signed)
[facility] Emergency Department Provider Note  ____________________________________________  Time seen: Approximately 11:07 AM  I have reviewed the triage vital signs and the nursing notes.   HISTORY  Chief Complaint Groin Pain and Flank Pain    HPI LYDEN REDNER is a 83 y.o. male who complains of right flank pain radiating to the right groin off and on for the past week. Worse over the last 24 hours. No nausea vomiting diarrhea constipation fever or chills. No abdominal pain further concerns. Denies dysuria frequency urgency hematuria. Worse with change of position and movement.     Past Medical History:  Diagnosis Date  . Diabetes mellitus without complication (HCC)   . Gout   . Hypertension   . Kidney stone   . Seizures (HCC)   . Thyroid disease      Patient Active Problem List   Diagnosis Date Noted  . Hyperkalemia 11/04/2016  . Left ureteral stone 02/20/2016  . Gross hematuria 02/20/2016     Past Surgical History:  Procedure Laterality Date  . EXTRACORPOREAL SHOCK WAVE LITHOTRIPSY Left 02/22/2016   Procedure: EXTRACORPOREAL SHOCK WAVE LITHOTRIPSY (ESWL);  Surgeon: Hildred Laser, MD;  Location: [facility] ORS;  Service: Urology;  Laterality: Left;  . HERNIA REPAIR  70's     Prior to Admission medications   Medication Sig Start Date End Date Taking? Authorizing Provider  cephALEXin (KEFLEX) 500 MG capsule Take 1 capsule (500 mg total) by mouth 2 (two) times daily. 11/24/16   Sharman Cheek, MD  divalproex (DEPAKOTE SPRINKLE) 125 MG capsule Take 250 mg by mouth at bedtime. 02/20/16   Historical Provider, MD  donepezil (ARICEPT) 5 MG tablet Take 5 mg by mouth at bedtime.    Historical Provider, MD  febuxostat (ULORIC) 40 MG tablet Take 40 mg by mouth daily.    Historical Provider, MD  ferrous sulfate 325 (65 FE) MG tablet Take 325 mg by mouth daily.    Historical Provider, MD  folic acid (FOLVITE) 1 MG tablet Take 1 mg by mouth daily.     Historical Provider, MD  furosemide (LASIX) 20 MG tablet Take 20 mg by mouth every morning.    Historical Provider, MD  gabapentin (NEURONTIN) 100 MG capsule Take 200 mg by mouth 2 (two) times daily. 10/08/16 11/07/16  Historical Provider, MD  glucose blood (ACCU-CHEK AVIVA) test strip 1 each by Other route 3 (three) times daily. (Fasting and 2 hours after largest meal)    Historical Provider, MD  insulin glargine (LANTUS) 100 UNIT/ML injection Inject 50 Units into the skin daily.     Historical Provider, MD  insulin lispro (HUMALOG) 100 UNIT/ML injection Inject 25 Units into the skin 3 (three) times daily before meals. (Hold if CBG is less than 100)    Historical Provider, MD  levETIRAcetam (KEPPRA) 500 MG tablet Take 500 mg by mouth 2 (two) times daily. 08/06/16 02/02/17  Historical Provider, MD  levothyroxine (SYNTHROID, LEVOTHROID) 75 MCG tablet Take 75 mcg by mouth daily.     Historical Provider, MD  magnesium oxide (MAG-OX) 400 MG tablet Take 400 mg by mouth daily.    Historical Provider, MD  patiromer (VELTASSA) 8.4 g packet Take 1 packet (8.4 g total) by mouth daily. 11/05/16   Enid Baas, MD  sodium bicarbonate 650 MG tablet Take 1,300 mg by mouth 3 (three) times daily.     Historical Provider, MD  Valproic Acid 250 MG CPDR Take 250 mg by mouth 2 (two) times daily.  Historical Provider, MD     Allergies Enalapril and Lisinopril   Family History  Problem Relation Age of Onset  . Cancer Mother   . Diabetes Sister   . Kidney disease Neg Hx   . Prostate cancer Neg Hx     Social History Social History  Substance Use Topics  . Smoking status: Never Smoker  . Smokeless tobacco: Never Used  . Alcohol use No    Review of Systems  Constitutional:   No fever or chills.  ENT:   No sore throat. No rhinorrhea. Cardiovascular:   No chest pain. Respiratory:   No dyspnea or cough. Gastrointestinal:   Negative for abdominal pain, vomiting and diarrhea. Positive flank  pain. Genitourinary:   Negative for dysuria or difficulty urinating. Musculoskeletal:   Negative for focal pain or swelling Neurological:   Negative for headaches 10-point ROS otherwise negative.  ____________________________________________   PHYSICAL EXAM:  VITAL SIGNS: ED Triage Vitals [11/24/16 0910]  Enc Vitals Group     BP 110/80     Pulse Rate 80     Resp 17     Temp 97.7 F (36.5 C)     Temp Source Oral     SpO2 97 %     Weight 155 lb (70.3 kg)     Height 5\' 5"  (1.651 m)     Head Circumference      Peak Flow      Pain Score 8     Pain Loc      Pain Edu?      Excl. in GC?     Vital signs reviewed, nursing assessments reviewed.   Constitutional:   Alert and oriented. Well appearing and in no distress. Eyes:   No scleral icterus. No conjunctival pallor. PERRL. EOMI.  No nystagmus. ENT   Head:   Normocephalic and atraumatic.   Nose:   No congestion/rhinnorhea. No septal hematoma   Mouth/Throat:   MMM, no pharyngeal erythema. No peritonsillar mass.    Neck:   No stridor. No SubQ emphysema. No meningismus. Hematological/Lymphatic/Immunilogical:   No cervical lymphadenopathy. Cardiovascular:   RRR. Symmetric bilateral radial and DP pulses.  No murmurs.  Respiratory:   Normal respiratory effort without tachypnea nor retractions. Breath sounds are clear and equal bilaterally. No wheezes/rales/rhonchi. Gastrointestinal:   Soft and nontender. Non distended. There is no CVA tenderness.  No rebound, rigidity, or guarding.No hernias. Genitourinary:   Unremarkable. No hernia Musculoskeletal:   Full range of motion in all extremities. No edema. No joint effusions. There is some tenderness in the paraspinous musculature of the right lower back.. Neurologic:   Normal speech and language.  CN 2-10 normal. Motor grossly intact. No gross focal neurologic deficits are appreciated.  Skin:    Skin is warm, dry and intact. No rash noted.  No petechiae, purpura, or  bullae.  ____________________________________________    LABS (pertinent positives/negatives) (all labs ordered are listed, but only abnormal results are displayed) Labs Reviewed  URINALYSIS, COMPLETE (UACMP) WITH MICROSCOPIC - Abnormal; Notable for the following:       Result Value   Color, Urine YELLOW (*)    APPearance CLEAR (*)    Leukocytes, UA TRACE (*)    Bacteria, UA MANY (*)    Squamous Epithelial / LPF 0-5 (*)    All other components within normal limits  BASIC METABOLIC PANEL - Abnormal; Notable for the following:    BUN 51 (*)    Creatinine, Ser 2.70 (*)    GFR  calc non Af Amer 20 (*)    GFR calc Af Amer 24 (*)    All other components within normal limits  CBC - Abnormal; Notable for the following:    RBC 3.84 (*)    Hemoglobin 11.2 (*)    HCT 33.3 (*)    RDW 14.7 (*)    All other components within normal limits  URINE CULTURE   ____________________________________________   EKG    ____________________________________________    RADIOLOGY  CT abdomen pelvis unremarkable. No acute findings. No ureterolithiasis.  ____________________________________________   PROCEDURES Procedures  ____________________________________________   INITIAL IMPRESSION / ASSESSMENT AND PLAN / ED COURSE  Pertinent labs & imaging results that were available during my care of the patient were reviewed by me and considered in my medical decision making (see chart for details).  Patient well appearing no acute distress. Vital signs unremarkable. Presents with right flank pain radiating to the groin. Exam suggestive of musculoskeletal origin. Urinalysis positive for urinary tract infection. Labs unremarkable, baseline CK D. CT does not show any acute findings. We'll treat with Keflex, follow-up with primary care.  Considering the patient's symptoms, medical history, and physical examination today, I have low suspicion for cholecystitis or biliary pathology, pancreatitis,  perforation or bowel obstruction, hernia, intra-abdominal abscess, AAA or dissection, volvulus or intussusception, mesenteric ischemia, or appendicitis.       ____________________________________________   FINAL CLINICAL IMPRESSION(S) / ED DIAGNOSES  Final diagnoses:  Lower urinary tract infectious disease  Right flank pain      New Prescriptions   CEPHALEXIN (KEFLEX) 500 MG CAPSULE    Take 1 capsule (500 mg total) by mouth 2 (two) times daily.     Portions of this note were generated with dragon dictation software. Dictation errors may occur despite best attempts at proofreading.    Sharman CheekPhillip Bayler Gehrig, MD 11/24/16 1110

## 2016-11-24 NOTE — ED Triage Notes (Signed)
Pt reports that he has been having rt sided groin pain and flank pain for a couple weeks now. Pt reports hx of kidney stone but this doesn't feel similar. Pt denies dysuria.

## 2016-11-24 NOTE — Discharge Instructions (Signed)
Your urine tests reveals a urinary tract infection. Your kidney function and other blood work is unremarkable. Your CT scan does not show any acute issues. Follow up with your primary care, and take Keflex to treat the infection.

## 2016-11-24 NOTE — ED Notes (Signed)
Pt asked daughter to sign discharge instructions

## 2016-11-24 NOTE — ED Notes (Signed)
Pt verbalizes understanding of discharge instructions.

## 2016-11-27 LAB — URINE CULTURE

## 2016-11-28 NOTE — Progress Notes (Signed)
ED Antimicrobial Stewardship Positive Culture Follow Up   Phillip Osborne is an 83 y.o. male who [facility] with a chief complaint of  Chief Complaint  Patient presents with  . Groin Pain  . Flank Pain    Recent Results (from the past 720 hour(s))  Urine culture     Status: Abnormal   Collection Time: 11/24/16  9:12 AM  Result Value Ref Range Status   Specimen Description URINE, RANDOM  Final   Special Requests NONE  Final   Culture (A)  Final    80,000 COLONIES/mL ESCHERICHIA COLI Confirmed Extended Spectrum Beta-Lactamase Producer (ESBL) Performed at [facility], [address] [place], [place] [address]    Report Status 11/27/2016 FINAL  Final   Organism ID, Bacteria ESCHERICHIA COLI (A)  Final      Susceptibility   Escherichia coli - MIC*    AMPICILLIN >=32 RESISTANT Resistant     CEFAZOLIN >=64 RESISTANT Resistant     CEFTRIAXONE >=64 RESISTANT Resistant     CIPROFLOXACIN >=4 RESISTANT Resistant     GENTAMICIN <=1 SENSITIVE Sensitive     IMIPENEM <=0.25 SENSITIVE Sensitive     NITROFURANTOIN <=16 SENSITIVE Sensitive     TRIMETH/SULFA >=320 RESISTANT Resistant     AMPICILLIN/SULBACTAM 16 INTERMEDIATE Intermediate     PIP/TAZO <=4 SENSITIVE Sensitive     Extended ESBL POSITIVE Resistant     * 80,000 COLONIES/mL ESCHERICHIA COLI    [x]  Treated with Keflex, organism resistant to prescribed antimicrobial []  Patient discharged originally without antimicrobial agent and treatment is now indicated  New antibiotic prescription: Only PO abx that was sensitive was macrobid, but considering his renal function, is contraindicated. Spoke to patient's caregiver and she stated that patient was feeling better, he just followed up w/ his primary care doctor and patient is doing fine. I did explain to the caregiver if the patient feels worsening lower back pain/groin pain or if he is having chills/fevers, or feeling sick that he should return to the ER  for re-evaluation.  ED Provider: Juanetta BeetsVeronese  Tavish Gettis, PharmD, BCPS Clinical Pharmacist 11/28/2016

## 2017-01-03 ENCOUNTER — Encounter: Payer: Self-pay | Admitting: Emergency Medicine

## 2017-01-03 ENCOUNTER — Emergency Department: Payer: Medicare Other

## 2017-01-03 ENCOUNTER — Inpatient Hospital Stay
Admission: EM | Admit: 2017-01-03 | Discharge: 2017-01-07 | DRG: 641 | Disposition: A | Discharge status: Skilled Nursing Facility | Payer: Medicare Other | Attending: Internal Medicine | Admitting: Internal Medicine

## 2017-01-03 DIAGNOSIS — Z888 Allergy status to other drugs, medicaments and biological substances status: Secondary | ICD-10-CM

## 2017-01-03 DIAGNOSIS — Z1612 Extended spectrum beta lactamase (ESBL) resistance: Secondary | ICD-10-CM | POA: Diagnosis present

## 2017-01-03 DIAGNOSIS — M545 Low back pain: Secondary | ICD-10-CM | POA: Diagnosis present

## 2017-01-03 DIAGNOSIS — E875 Hyperkalemia: Principal | ICD-10-CM | POA: Diagnosis present

## 2017-01-03 DIAGNOSIS — F0390 Unspecified dementia without behavioral disturbance: Secondary | ICD-10-CM | POA: Diagnosis present

## 2017-01-03 DIAGNOSIS — G40909 Epilepsy, unspecified, not intractable, without status epilepticus: Secondary | ICD-10-CM | POA: Diagnosis present

## 2017-01-03 DIAGNOSIS — N390 Urinary tract infection, site not specified: Secondary | ICD-10-CM | POA: Diagnosis present

## 2017-01-03 DIAGNOSIS — M109 Gout, unspecified: Secondary | ICD-10-CM | POA: Diagnosis present

## 2017-01-03 DIAGNOSIS — D631 Anemia in chronic kidney disease: Secondary | ICD-10-CM | POA: Diagnosis present

## 2017-01-03 DIAGNOSIS — E039 Hypothyroidism, unspecified: Secondary | ICD-10-CM | POA: Diagnosis present

## 2017-01-03 DIAGNOSIS — B9620 Unspecified Escherichia coli [E. coli] as the cause of diseases classified elsewhere: Secondary | ICD-10-CM | POA: Diagnosis present

## 2017-01-03 DIAGNOSIS — R531 Weakness: Secondary | ICD-10-CM

## 2017-01-03 DIAGNOSIS — I129 Hypertensive chronic kidney disease with stage 1 through stage 4 chronic kidney disease, or unspecified chronic kidney disease: Secondary | ICD-10-CM | POA: Diagnosis present

## 2017-01-03 DIAGNOSIS — Z794 Long term (current) use of insulin: Secondary | ICD-10-CM | POA: Diagnosis not present

## 2017-01-03 DIAGNOSIS — Z87442 Personal history of urinary calculi: Secondary | ICD-10-CM

## 2017-01-03 DIAGNOSIS — Z79899 Other long term (current) drug therapy: Secondary | ICD-10-CM | POA: Diagnosis not present

## 2017-01-03 DIAGNOSIS — R52 Pain, unspecified: Secondary | ICD-10-CM

## 2017-01-03 DIAGNOSIS — E1122 Type 2 diabetes mellitus with diabetic chronic kidney disease: Secondary | ICD-10-CM | POA: Diagnosis present

## 2017-01-03 DIAGNOSIS — N184 Chronic kidney disease, stage 4 (severe): Secondary | ICD-10-CM | POA: Diagnosis present

## 2017-01-03 DIAGNOSIS — Z833 Family history of diabetes mellitus: Secondary | ICD-10-CM | POA: Diagnosis not present

## 2017-01-03 LAB — URINALYSIS, COMPLETE (UACMP) WITH MICROSCOPIC
BILIRUBIN URINE: NEGATIVE
Glucose, UA: >=500 mg/dL — AB
Hgb urine dipstick: NEGATIVE
KETONES UR: NEGATIVE mg/dL
Leukocytes, UA: NEGATIVE
Nitrite: NEGATIVE
PH: 8 (ref 5.0–8.0)
Protein, ur: NEGATIVE mg/dL
Specific Gravity, Urine: 1.01 (ref 1.005–1.030)

## 2017-01-03 LAB — BASIC METABOLIC PANEL
ANION GAP: 7 (ref 5–15)
BUN: 44 mg/dL — ABNORMAL HIGH (ref 6–20)
CALCIUM: 9.6 mg/dL (ref 8.9–10.3)
CO2: 22 mmol/L (ref 22–32)
Chloride: 107 mmol/L (ref 101–111)
Creatinine, Ser: 2.87 mg/dL — ABNORMAL HIGH (ref 0.61–1.24)
GFR calc Af Amer: 22 mL/min — ABNORMAL LOW (ref >60)
GFR calc non Af Amer: 19 mL/min — ABNORMAL LOW (ref >60)
GLUCOSE: 123 mg/dL — AB (ref 65–99)
Potassium: 6.9 mmol/L (ref 3.5–5.1)
Sodium: 136 mmol/L (ref 135–145)

## 2017-01-03 LAB — GLUCOSE, CAPILLARY
Glucose-Capillary: 107 mg/dL — ABNORMAL HIGH (ref 65–99)
Glucose-Capillary: 101 mg/dL — ABNORMAL HIGH (ref 65–99)
Glucose-Capillary: 134 mg/dL — ABNORMAL HIGH (ref 65–99)

## 2017-01-03 LAB — CBC
HEMATOCRIT: 35.7 % — AB (ref 40.0–52.0)
HEMOGLOBIN: 11.8 g/dL — AB (ref 13.0–18.0)
MCH: 29.3 pg (ref 26.0–34.0)
MCHC: 33 g/dL (ref 32.0–36.0)
MCV: 88.9 fL (ref 80.0–100.0)
Platelets: 203 10*3/uL (ref 150–440)
RBC: 4.02 MIL/uL — ABNORMAL LOW (ref 4.40–5.90)
RDW: 15.8 % — ABNORMAL HIGH (ref 11.5–14.5)
WBC: 14.7 10*3/uL — ABNORMAL HIGH (ref 3.8–10.6)

## 2017-01-03 LAB — TROPONIN I: Troponin I: <0.03 ng/mL (ref <0.03)

## 2017-01-03 LAB — POTASSIUM: POTASSIUM: 6.2 mmol/L — AB (ref 3.5–5.1)

## 2017-01-03 MED ORDER — SODIUM CHLORIDE 0.9 % IV BOLUS (SEPSIS)
1000.0000 mL | Freq: Once | INTRAVENOUS | Status: AC
Start: 2017-01-03 — End: 2017-01-03
  Administered 2017-01-03: 1000 mL via INTRAVENOUS

## 2017-01-03 MED ORDER — DEXTROSE 50 % IV SOLN
25.0000 g | Freq: Once | INTRAVENOUS | Status: AC
  Administered 2017-01-03: 25 g via INTRAVENOUS
  Filled 2017-01-03: qty 50

## 2017-01-03 MED ORDER — INSULIN ASPART 100 UNIT/ML ~~LOC~~ SOLN
10.0000 [IU] | Freq: Once | SUBCUTANEOUS | Status: AC
  Administered 2017-01-03: 10 [IU] via INTRAVENOUS
  Filled 2017-01-03: qty 10

## 2017-01-03 NOTE — Progress Notes (Signed)
Family Meeting Note  Advance Directive:yes  Today a meeting took place with the Patient and spouse.  The following clinical team members were present during this meeting:MD  The following were discussed:Patient's diagnosis: CKD, hyperkalemia, DM , Patient's progosis: Unable to determine and Goals for treatment: Continue present management  Additional follow-up to be provided: nephrology consult.  Time spent during discussion:20 minutes  Amando Chaput, Heath Gold, MD

## 2017-01-03 NOTE — ED Provider Notes (Signed)
[facility] Provider Note  Time seen: 7:56 PM  I have reviewed the triage vital signs and the nursing notes.   HISTORY  Chief Complaint Weakness    HPI Phillip Osborne is a 83 y.o. male with a past medical history of diabetes, hypertension, presents to the emergency department for generalized weakness. According to family patient was able to get up this morning and eat breakfast however over the course of today he has become progressively more weak and now has been unable to get off the couch due to weakness so they brought him to the emergency department for evaluation. They state similar episodes in the past due to urinary tract infections. Patient states a mild cough recently but denies any fever. Denies abdominal pain nausea vomiting diarrhea, dysuria or hematuria. Patient has no complaints at this time besides feeling a general feeling of weakness. Denies focal deficits.  Past Medical History:  Diagnosis Date  . Diabetes mellitus without complication (HCC)   . Gout   . Hypertension   . Kidney stone   . Seizures (HCC)   . Thyroid disease     Patient Active Problem List   Diagnosis Date Noted  . Hyperkalemia 11/04/2016  . Left ureteral stone 02/20/2016  . Gross hematuria 02/20/2016    Past Surgical History:  Procedure Laterality Date  . EXTRACORPOREAL SHOCK WAVE LITHOTRIPSY Left 02/22/2016   Procedure: EXTRACORPOREAL SHOCK WAVE LITHOTRIPSY (ESWL);  Surgeon: Hildred Laser, MD;  Location: [facility] ORS;  Service: Urology;  Laterality: Left;  . HERNIA REPAIR  70's    Prior to Admission medications   Medication Sig Start Date End Date Taking? Authorizing Provider  cephALEXin (KEFLEX) 500 MG capsule Take 1 capsule (500 mg total) by mouth 2 (two) times daily. 11/24/16   Sharman Cheek, MD  divalproex (DEPAKOTE SPRINKLE) 125 MG capsule Take 250 mg by mouth at bedtime. 02/20/16   Historical Provider, MD  donepezil (ARICEPT) 5 MG  tablet Take 5 mg by mouth at bedtime.    Historical Provider, MD  febuxostat (ULORIC) 40 MG tablet Take 40 mg by mouth daily.    Historical Provider, MD  ferrous sulfate 325 (65 FE) MG tablet Take 325 mg by mouth daily.    Historical Provider, MD  folic acid (FOLVITE) 1 MG tablet Take 1 mg by mouth daily.    Historical Provider, MD  furosemide (LASIX) 20 MG tablet Take 20 mg by mouth every morning.    Historical Provider, MD  gabapentin (NEURONTIN) 100 MG capsule Take 200 mg by mouth 2 (two) times daily. 10/08/16 11/07/16  Historical Provider, MD  glucose blood (ACCU-CHEK AVIVA) test strip 1 each by Other route 3 (three) times daily. (Fasting and 2 hours after largest meal)    Historical Provider, MD  insulin glargine (LANTUS) 100 UNIT/ML injection Inject 50 Units into the skin daily.     Historical Provider, MD  insulin lispro (HUMALOG) 100 UNIT/ML injection Inject 25 Units into the skin 3 (three) times daily before meals. (Hold if CBG is less than 100)    Historical Provider, MD  levETIRAcetam (KEPPRA) 500 MG tablet Take 500 mg by mouth 2 (two) times daily. 08/06/16 02/02/17  Historical Provider, MD  levothyroxine (SYNTHROID, LEVOTHROID) 75 MCG tablet Take 75 mcg by mouth daily.     Historical Provider, MD  magnesium oxide (MAG-OX) 400 MG tablet Take 400 mg by mouth daily.    Historical Provider, MD  patiromer (VELTASSA) 8.4 g packet Take 1 packet (  8.4 g total) by mouth daily. 11/05/16   Enid Baas, MD  sodium bicarbonate 650 MG tablet Take 1,300 mg by mouth 3 (three) times daily.     Historical Provider, MD  Valproic Acid 250 MG CPDR Take 250 mg by mouth 2 (two) times daily.    Historical Provider, MD    Allergies  Allergen Reactions  . Enalapril Swelling    Face swelling.  . Lisinopril Swelling    Family History  Problem Relation Age of Onset  . Cancer Mother   . Diabetes Sister   . Kidney disease Neg Hx   . Prostate cancer Neg Hx     Social History Social History  Substance  Use Topics  . Smoking status: Never Smoker  . Smokeless tobacco: Never Used  . Alcohol use No    Review of Systems Constitutional: Negative for fever. Cardiovascular: Negative for chest pain. Respiratory: Negative for shortness of breath. Gastrointestinal: Negative for abdominal pain, vomiting and diarrhea. Genitourinary: Negative for dysuria. Musculoskeletal: Positive for back pain which the patient and family state is chronic and unchanged. Neurological: Negative for headaches, focal weakness or numbness. Positive for generalized weakness. 10-point ROS otherwise negative.  ____________________________________________   PHYSICAL EXAM:  VITAL SIGNS: ED Triage Vitals  Enc Vitals Group     BP 01/03/17 1854 (!) 107/57     Pulse Rate 01/03/17 1854 79     Resp 01/03/17 1854 20     Temp 01/03/17 1854 98.6 F (37 C)     Temp Source 01/03/17 1854 Oral     SpO2 01/03/17 1854 95 %     Weight 01/03/17 1855 167 lb (75.8 kg)     Height 01/03/17 1855  (1.651 m)     Head Circumference --      Peak Flow --      Pain Score 01/03/17 1854 8     Pain Loc --      Pain Edu? --      Excl. in GC? --     Constitutional: Alert and oriented. Well appearing and in no distress. Eyes: Normal exam ENT   Head: Normocephalic and atraumatic.   Mouth/Throat: Mucous membranes are moist. Cardiovascular: Normal rate, regular rhythm. No murmur Respiratory: Normal respiratory effort without tachypnea nor retractions. Breath sounds are clear Gastrointestinal: Soft and nontender. No distention.   Musculoskeletal: Nontender with normal range of motion in all extremities.  Neurologic:  Normal speech and language. No gross focal neurologic deficits. 5/5 motor in bilateral upper extremities, 4+/5 motor in bilateral lower extremities. Skin:  Skin is warm, dry and intact.  Psychiatric: Mood and affect are normal.   ____________________________________________    EKG  EKG reviewed and  interpreted by myself shows normal sinus rhythm at 84 bpm, narrow QRS, left axis deviation, normal intervals with nonspecific ST changes. No ST elevation.  ____________________________________________   INITIAL IMPRESSION / ASSESSMENT AND PLAN / ED COURSE  Pertinent labs & imaging results that were available during my care of the patient were reviewed by me and considered in my medical decision making (see chart for details).  Patient presents the emergency department progressive weakness over the course of the day today now he states he is too weak to get off the couch. We will check labs, chest x-ray, urinalysis and closely monitor. Family states similar episodes in the past due to urinary tract infections.  Patient's potassium is significantly elevated 6.9. We will treat with insulin and glucose and continue to closely monitor. We  will recheck a potassium level after treatment. Given the patient's continued generalized weakness with hyperkalemia we will admit the patient to the hospital for further treatment.   CRITICAL CARE Performed by: Minna Antis   Total critical care time: 30 minutes  Critical care time was exclusive of separately billable procedures and treating other patients.  Critical care was necessary to treat or prevent imminent or life-threatening deterioration.  Critical care was time spent personally by me on the following activities: development of treatment plan with patient and/or surrogate as well as nursing, discussions with consultants, evaluation of patient's response to treatment, examination of patient, obtaining history from patient or surrogate, ordering and performing treatments and interventions, ordering and review of laboratory studies, ordering and review of radiographic studies, pulse oximetry and re-evaluation of patient's condition.    ____________________________________________   FINAL CLINICAL IMPRESSION(S) / ED DIAGNOSES  Generalized  weakness Hyperkalemia   Minna Antis, MD 01/03/17 2121

## 2017-01-03 NOTE — H&P (Signed)
[employer] [facility]   PATIENT NAME: Phillip Osborne    MR#:  [id number]  DATE OF BIRTH:  1932/06/20  DATE OF ADMISSION:  01/03/2017  PRIMARY CARE PHYSICIAN: Marisue Ivan, MD   REQUESTING/REFERRING PHYSICIAN: [email or web address]  CHIEF COMPLAINT:   Chief Complaint  Patient presents with  . Weakness    HISTORY OF PRESENT ILLNESS: Phillip Osborne  is a 83 y.o. male with a known history of Chronic kidney disease stage IV, diabetes, hypertension, seizures, thyroid disease- had some change in his seizure medications recently and for last 2 days he started feeling more weakness in his legs but today it was very severe and he could not even get out of so concerned with this came to emergency room. Along with this he also complained of some lower back pain for last few days but he denies any history of fall. On lab work he was noted to have potassium level of 6.9 but no EKG changes. He is given injection dextrose and insulin and IV fluids and given to hospitalist team for further management.  PAST MEDICAL HISTORY:   Past Medical History:  Diagnosis Date  . Diabetes mellitus without complication (HCC)   . Gout   . Hypertension   . Kidney stone   . Seizures (HCC)   . Thyroid disease     PAST SURGICAL HISTORY: Past Surgical History:  Procedure Laterality Date  . EXTRACORPOREAL SHOCK WAVE LITHOTRIPSY Left 02/22/2016   Procedure: EXTRACORPOREAL SHOCK WAVE LITHOTRIPSY (ESWL);  Surgeon: Hildred Laser, MD;  Location: [facility] ORS;  Service: Urology;  Laterality: Left;  . HERNIA REPAIR  70's    SOCIAL HISTORY:  Social History  Substance Use Topics  . Smoking status: Never Smoker  . Smokeless tobacco: Never Used  . Alcohol use No    FAMILY HISTORY:  Family History  Problem Relation Age of Onset  . Cancer Mother   . Diabetes Sister   . Kidney disease Neg Hx   . Prostate cancer Neg Hx     DRUG ALLERGIES:  Allergies  Allergen Reactions  . Enalapril  Swelling    Face swelling.  . Lisinopril Swelling    REVIEW OF SYSTEMS:   CONSTITUTIONAL: No fever, Generalized fatigue or weakness.  EYES: No blurred or double vision.  EARS, NOSE, AND THROAT: No tinnitus or ear pain.  RESPIRATORY: No cough, shortness of breath, wheezing or hemoptysis.  CARDIOVASCULAR: No chest pain, orthopnea, edema.  GASTROINTESTINAL: No nausea, vomiting, diarrhea or abdominal pain.  GENITOURINARY: No dysuria, hematuria.  ENDOCRINE: No polyuria, nocturia,  HEMATOLOGY: No anemia, easy bruising or bleeding SKIN: No rash or lesion. MUSCULOSKELETAL: No joint pain or arthritis.   NEUROLOGIC: No tingling, numbness, weakness.  PSYCHIATRY: No anxiety or depression.   MEDICATIONS AT HOME:  Prior to Admission medications   Medication Sig Start Date End Date Taking? Authorizing Provider  donepezil (ARICEPT) 5 MG tablet Take 5 mg by mouth at bedtime.   Yes Historical Provider, MD  febuxostat (ULORIC) 40 MG tablet Take 40 mg by mouth daily.   Yes Historical Provider, MD  ferrous sulfate 325 (65 FE) MG tablet Take 325 mg by mouth daily.   Yes Historical Provider, MD  folic acid (FOLVITE) 1 MG tablet Take 1 mg by mouth daily.   Yes Historical Provider, MD  furosemide (LASIX) 20 MG tablet Take 20 mg by mouth every morning.   Yes Historical Provider, MD  gabapentin (NEURONTIN) 100 MG capsule Take 200 mg by mouth 2 (  two) times daily. 10/08/16 01/03/18 Yes Historical Provider, MD  glucose blood (ACCU-CHEK AVIVA) test strip 1 each by Other route 3 (three) times daily. (Fasting and 2 hours after largest meal)   Yes Historical Provider, MD  insulin glargine (LANTUS) 100 UNIT/ML injection Inject 45 Units into the skin daily.    Yes Historical Provider, MD  insulin lispro (HUMALOG) 100 UNIT/ML injection Inject 5 Units into the skin 3 (three) times daily before meals. (Hold if CBG is less than 100)   Yes Historical Provider, MD  lamoTRIgine (LAMICTAL) 25 MG tablet Take 25-50 mg by mouth 2  (two) times daily. Take 25 mg twice daily for 2 weeks, then 25 mg in the am and 50 mg nightly for 2 weeks, then 50 mg twice daily. 12/16/16  Yes Historical Provider, MD  levETIRAcetam (KEPPRA) 500 MG tablet Take 500 mg by mouth 2 (two) times daily. 08/06/16 02/02/17 Yes Historical Provider, MD  levothyroxine (SYNTHROID, LEVOTHROID) 75 MCG tablet Take 75 mcg by mouth daily.    Yes Historical Provider, MD  patiromer (VELTASSA) 8.4 g packet Take 1 packet (8.4 g total) by mouth daily. 11/05/16  Yes Enid Baas, MD  sodium bicarbonate 650 MG tablet Take 1,300 mg by mouth 3 (three) times daily.    Yes Historical Provider, MD  Valproic Acid 250 MG CPDR Take 250 mg by mouth 2 (two) times daily.   Yes Historical Provider, MD      PHYSICAL EXAMINATION:   VITAL SIGNS: Blood pressure (!) 142/94, pulse 94, temperature 98.8 F (37.1 C), temperature source Oral, resp. rate (!) 24, height  (1.651 m), weight 75.8 kg (167 lb), SpO2 100 %.  GENERAL:  83 y.o.-year-old patient lying in the bed with no acute distress.  EYES: Pupils equal, round, reactive to light and accommodation. No scleral icterus. Extraocular muscles intact.  HEENT: Head atraumatic, normocephalic. Oropharynx and nasopharynx clear.  NECK:  Supple, no jugular venous distention. No thyroid enlargement, no tenderness.  LUNGS: Normal breath sounds bilaterally, no wheezing, rales,rhonchi or crepitation. No use of accessory muscles of respiration.  CARDIOVASCULAR: S1, S2 normal. No murmurs, rubs, or gallops.  ABDOMEN: Soft, nontender, nondistended. Bowel sounds present. No organomegaly or mass. Some tenderness on lumbar spines. EXTREMITIES: No pedal edema, cyanosis, or clubbing.  NEUROLOGIC: Cranial nerves II through XII are intact. Muscle strength 4/5 in all extremities. Sensation intact. Gait not checked.  PSYCHIATRIC: The patient is alert and oriented x 3.  SKIN: No obvious rash, lesion, or ulcer.   LABORATORY PANEL:   CBC  Recent  Labs Lab 01/03/17 1914  WBC 14.7*  HGB 11.8*  HCT 35.7*  PLT 203  MCV 88.9  MCH 29.3  MCHC 33.0  RDW 15.8*   ------------------------------------------------------------------------------------------------------------------  Chemistries   Recent Labs Lab 01/03/17 1914 01/03/17 2132  NA 136  --   K 6.9* 6.2*  CL 107  --   CO2 22  --   GLUCOSE 123*  --   BUN 44*  --   CREATININE 2.87*  --   CALCIUM 9.6  --    ------------------------------------------------------------------------------------------------------------------ estimated creatinine clearance is 18.9 mL/min (A) (by C-G formula based on SCr of 2.87 mg/dL (H)). ------------------------------------------------------------------------------------------------------------------ No results for input(s): TSH, T4TOTAL, T3FREE, THYROIDAB in the last 72 hours.  Invalid input(s): FREET3   Coagulation profile No results for input(s): INR, PROTIME in the last 168 hours. ------------------------------------------------------------------------------------------------------------------- No results for input(s): DDIMER in the last 72 hours. -------------------------------------------------------------------------------------------------------------------  Cardiac Enzymes  Recent Labs Lab 01/03/17 1914  TROPONINI <0.03   ------------------------------------------------------------------------------------------------------------------ Invalid input(s): POCBNP  ---------------------------------------------------------------------------------------------------------------  Urinalysis    Component Value Date/Time   COLORURINE STRAW (A) 01/03/2017 2057   APPEARANCEUR CLEAR (A) 01/03/2017 2057   APPEARANCEUR Clear 04/12/2016 1101   LABSPEC 1.010 01/03/2017 2057   LABSPEC 1.011 04/16/2014 1650   PHURINE 8.0 01/03/2017 2057   GLUCOSEU >=500 (A) 01/03/2017 2057   GLUCOSEU Negative 04/16/2014 1650   HGBUR NEGATIVE  01/03/2017 2057   BILIRUBINUR NEGATIVE 01/03/2017 2057   BILIRUBINUR Negative 04/12/2016 1101   BILIRUBINUR Negative 04/16/2014 1650   KETONESUR NEGATIVE 01/03/2017 2057   PROTEINUR NEGATIVE 01/03/2017 2057   NITRITE NEGATIVE 01/03/2017 2057   LEUKOCYTESUR NEGATIVE 01/03/2017 2057   LEUKOCYTESUR Trace (A) 04/12/2016 1101   LEUKOCYTESUR Negative 04/16/2014 1650     RADIOLOGY: Dg Chest 2 View  Result Date: 01/03/2017 CLINICAL DATA:  Acute onset of generalized weakness and cough. Initial encounter. EXAM: CHEST  2 VIEW COMPARISON:  Chest radiograph from 11/04/2016 FINDINGS: The lungs are well-aerated. Minimal bibasilar atelectasis is noted. There is no evidence of pleural effusion or pneumothorax. The heart is normal in size; the mediastinal contour is within normal limits. No acute osseous abnormalities are seen. IMPRESSION: Minimal bibasilar atelectasis noted.  Lungs otherwise clear. Electronically Signed   By: Roanna Raider M.D.   On: 01/03/2017 20:47    EKG: Orders placed or performed during the hospital encounter of 01/03/17  . EKG 12-Lead  . EKG 12-Lead  . ED EKG  . ED EKG    IMPRESSION AND PLAN:  * Hyperkalemia   Injection insulin and dextrose is given, EKG has no changes.   IV fluids and monitor, give veltassa.  * Chronic kidney disease stage IV     Appears stable, we will call nephrology consult for further management.  * Hypertension   Continue home medication, stable.  * Seizures   Continue home medications.  * Diabetes     Continue Lantus and keep on sliding scale coverage.  * Hypothyroidism   Continue levothyroxine.    All the records are reviewed and case discussed with ED provider. Management plans discussed with the patient, family and they are in agreement.  CODE STATUS: full code.  Code Status History    Date Active Date Inactive Code Status Order ID Comments User Context   11/04/2016 10:09 PM 11/05/2016  5:33 PM Full Code [id number]  Houston Siren, MD Inpatient       TOTAL TIME TAKING CARE OF THIS PATIENT: 45 minutes.    Altamese Dilling M.D on 01/03/2017   Between 7am to 6pm - Pager - [phone]  After 6pm go to [email or web address] - password [email or web address]  [facility]  Office  [phone]  CC: Primary care physician; Marisue Ivan, MD   Note: This dictation was prepared with Dragon dictation along with smaller phrase technology. Any transcriptional errors that result from this process are unintentional.

## 2017-01-03 NOTE — ED Triage Notes (Signed)
States this am was able to get up and get some breakfast his wife prepared. They he went and rest on the couch. Was able to get up to BR a couple times per wife but very weak and last time could not get up to walk. Moves both legs in wheelchair equally but weakly.

## 2017-01-04 ENCOUNTER — Inpatient Hospital Stay: Payer: Medicare Other

## 2017-01-04 LAB — GLUCOSE, CAPILLARY
GLUCOSE-CAPILLARY: 186 mg/dL — AB (ref 65–99)
Glucose-Capillary: 140 mg/dL — ABNORMAL HIGH (ref 65–99)
Glucose-Capillary: 155 mg/dL — ABNORMAL HIGH (ref 65–99)
Glucose-Capillary: 171 mg/dL — ABNORMAL HIGH (ref 65–99)
Glucose-Capillary: 184 mg/dL — ABNORMAL HIGH (ref 65–99)
Glucose-Capillary: 228 mg/dL — ABNORMAL HIGH (ref 65–99)

## 2017-01-04 LAB — CBC
HEMATOCRIT: 31 % — AB (ref 40.0–52.0)
HEMOGLOBIN: 10.2 g/dL — AB (ref 13.0–18.0)
MCH: 29.5 pg (ref 26.0–34.0)
MCHC: 33 g/dL (ref 32.0–36.0)
MCV: 89.4 fL (ref 80.0–100.0)
Platelets: 151 10*3/uL (ref 150–440)
RBC: 3.47 MIL/uL — ABNORMAL LOW (ref 4.40–5.90)
RDW: 15.5 % — AB (ref 11.5–14.5)
WBC: 11.7 10*3/uL — ABNORMAL HIGH (ref 3.8–10.6)

## 2017-01-04 LAB — BASIC METABOLIC PANEL
ANION GAP: 6 (ref 5–15)
BUN: 38 mg/dL — AB (ref 6–20)
CALCIUM: 9.1 mg/dL (ref 8.9–10.3)
CO2: 21 mmol/L — AB (ref 22–32)
Chloride: 109 mmol/L (ref 101–111)
Creatinine, Ser: 2.48 mg/dL — ABNORMAL HIGH (ref 0.61–1.24)
GFR calc Af Amer: 26 mL/min — ABNORMAL LOW (ref >60)
GFR, EST NON AFRICAN AMERICAN: 23 mL/min — AB (ref >60)
GLUCOSE: 181 mg/dL — AB (ref 65–99)
POTASSIUM: 6.1 mmol/L — AB (ref 3.5–5.1)
Sodium: 136 mmol/L (ref 135–145)

## 2017-01-04 LAB — VALPROIC ACID LEVEL: VALPROIC ACID LVL: 57 ug/mL (ref 50.0–100.0)

## 2017-01-04 MED ORDER — SODIUM POLYSTYRENE SULFONATE 15 GM/60ML PO SUSP
45.0000 g | ORAL | Status: AC
  Administered 2017-01-04: 45 g via ORAL
  Filled 2017-01-04: qty 180

## 2017-01-04 MED ORDER — PATIROMER SORBITEX CALCIUM 8.4 G PO PACK
8.4000 g | PACK | Freq: Every day | ORAL | Status: DC
  Administered 2017-01-04 – 2017-01-07 (×4): 8.4 g via ORAL
  Filled 2017-01-04 (×6): qty 4

## 2017-01-04 MED ORDER — FOLIC ACID 1 MG PO TABS
1.0000 mg | ORAL_TABLET | Freq: Every day | ORAL | Status: DC
  Administered 2017-01-04 – 2017-01-07 (×4): 1 mg via ORAL
  Filled 2017-01-04 (×4): qty 1

## 2017-01-04 MED ORDER — INSULIN ASPART 100 UNIT/ML ~~LOC~~ SOLN
0.0000 [IU] | Freq: Three times a day (TID) | SUBCUTANEOUS | Status: DC
  Administered 2017-01-04 – 2017-01-05 (×2): 2 [IU] via SUBCUTANEOUS
  Administered 2017-01-05: 1 [IU] via SUBCUTANEOUS
  Administered 2017-01-06: 2 [IU] via SUBCUTANEOUS
  Administered 2017-01-06: 3 [IU] via SUBCUTANEOUS
  Administered 2017-01-07: 2 [IU] via SUBCUTANEOUS
  Filled 2017-01-04: qty 2
  Filled 2017-01-04: qty 3
  Filled 2017-01-04 (×3): qty 2

## 2017-01-04 MED ORDER — LEVETIRACETAM 500 MG PO TABS
500.0000 mg | ORAL_TABLET | Freq: Two times a day (BID) | ORAL | Status: DC
  Administered 2017-01-04 – 2017-01-07 (×8): 500 mg via ORAL
  Filled 2017-01-04 (×8): qty 1

## 2017-01-04 MED ORDER — INSULIN GLARGINE 100 UNIT/ML ~~LOC~~ SOLN
45.0000 [IU] | Freq: Every day | SUBCUTANEOUS | Status: DC
  Administered 2017-01-04 – 2017-01-07 (×4): 45 [IU] via SUBCUTANEOUS
  Filled 2017-01-04 (×5): qty 0.45

## 2017-01-04 MED ORDER — LAMOTRIGINE 25 MG PO TABS: 25.0000 mg | ORAL_TABLET | Freq: Two times a day (BID) | ORAL | Status: DC

## 2017-01-04 MED ORDER — DIVALPROEX SODIUM 125 MG PO CSDR
250.0000 mg | DELAYED_RELEASE_CAPSULE | Freq: Two times a day (BID) | ORAL | Status: DC
  Administered 2017-01-04 – 2017-01-07 (×8): 250 mg via ORAL
  Filled 2017-01-04 (×8): qty 2

## 2017-01-04 MED ORDER — DONEPEZIL HCL 5 MG PO TABS
5.0000 mg | ORAL_TABLET | Freq: Every day | ORAL | Status: DC
  Administered 2017-01-04 – 2017-01-06 (×4): 5 mg via ORAL
  Filled 2017-01-04 (×4): qty 1

## 2017-01-04 MED ORDER — INSULIN ASPART 100 UNIT/ML ~~LOC~~ SOLN
0.0000 [IU] | Freq: Every day | SUBCUTANEOUS | Status: DC
  Administered 2017-01-06: 2 [IU] via SUBCUTANEOUS
  Filled 2017-01-04: qty 3

## 2017-01-04 MED ORDER — FERROUS SULFATE 325 (65 FE) MG PO TABS
325.0000 mg | ORAL_TABLET | Freq: Every day | ORAL | Status: DC
  Administered 2017-01-04 – 2017-01-07 (×4): 325 mg via ORAL
  Filled 2017-01-04 (×4): qty 1

## 2017-01-04 MED ORDER — SODIUM CHLORIDE 0.45 % IV SOLN
INTRAVENOUS | Status: DC
  Administered 2017-01-04 – 2017-01-05 (×2): via INTRAVENOUS

## 2017-01-04 MED ORDER — FLUDROCORTISONE ACETATE 0.1 MG PO TABS
0.1000 mg | ORAL_TABLET | Freq: Every day | ORAL | Status: DC
  Administered 2017-01-04 – 2017-01-07 (×4): 0.1 mg via ORAL
  Filled 2017-01-04 (×5): qty 1

## 2017-01-04 MED ORDER — GABAPENTIN 100 MG PO CAPS
200.0000 mg | ORAL_CAPSULE | Freq: Two times a day (BID) | ORAL | Status: DC
  Administered 2017-01-04 – 2017-01-07 (×8): 200 mg via ORAL
  Filled 2017-01-04 (×8): qty 2

## 2017-01-04 MED ORDER — VALPROIC ACID 250 MG PO CPDR
250.0000 mg | DELAYED_RELEASE_CAPSULE | Freq: Two times a day (BID) | ORAL | Status: DC
Start: 2017-01-04 — End: 2017-01-04

## 2017-01-04 MED ORDER — SODIUM BICARBONATE 650 MG PO TABS
1300.0000 mg | ORAL_TABLET | Freq: Three times a day (TID) | ORAL | Status: DC
  Administered 2017-01-04 – 2017-01-07 (×11): 1300 mg via ORAL
  Filled 2017-01-04 (×12): qty 2

## 2017-01-04 MED ORDER — FUROSEMIDE 20 MG PO TABS
20.0000 mg | ORAL_TABLET | ORAL | Status: DC
  Administered 2017-01-04 – 2017-01-07 (×4): 20 mg via ORAL
  Filled 2017-01-04 (×4): qty 1

## 2017-01-04 MED ORDER — DOCUSATE SODIUM 100 MG PO CAPS: 100.0000 mg | ORAL_CAPSULE | Freq: Two times a day (BID) | ORAL | Status: DC | PRN

## 2017-01-04 MED ORDER — HEPARIN SODIUM (PORCINE) 5000 UNIT/ML IJ SOLN
5000.0000 [IU] | Freq: Three times a day (TID) | INTRAMUSCULAR | Status: DC
  Administered 2017-01-04 – 2017-01-07 (×11): 5000 [IU] via SUBCUTANEOUS
  Filled 2017-01-04 (×11): qty 1

## 2017-01-04 MED ORDER — VALPROIC ACID 250 MG PO CAPS
250.0000 mg | ORAL_CAPSULE | Freq: Two times a day (BID) | ORAL | Status: DC
  Filled 2017-01-04 (×2): qty 1

## 2017-01-04 MED ORDER — LAMOTRIGINE 25 MG PO TABS
25.0000 mg | ORAL_TABLET | Freq: Every day | ORAL | Status: DC
  Administered 2017-01-04 – 2017-01-07 (×4): 25 mg via ORAL
  Filled 2017-01-04 (×4): qty 1

## 2017-01-04 MED ORDER — LEVOTHYROXINE SODIUM 75 MCG PO TABS
75.0000 ug | ORAL_TABLET | Freq: Every day | ORAL | Status: DC
  Administered 2017-01-04 – 2017-01-05 (×2): 75 ug via ORAL
  Filled 2017-01-04 (×2): qty 1

## 2017-01-04 MED ORDER — LAMOTRIGINE 100 MG PO TABS
50.0000 mg | ORAL_TABLET | Freq: Every day | ORAL | Status: DC
  Administered 2017-01-04 – 2017-01-06 (×4): 50 mg via ORAL
  Filled 2017-01-04 (×4): qty 1

## 2017-01-04 NOTE — Evaluation (Signed)
Physical Therapy Evaluation Patient Details Name: Phillip Osborne MRN: [id number] DOB: 1932-09-15 Today's Date: 01/04/2017   History of Present Illness   83 y.o. male with a known history of Chronic kidney disease stage IV, diabetes, hypertension, seizures, thyroid disease- had some change in his seizure medications recently and for last 2 days he started feeling more weakness in his legs but today it was very severe and he could not even get out of so concerned with this came to emergency room. Along with this he also complained of some lower back pain for last few days. Admitted with hyperkemia, K+ has improved (6.1 for PT exam)  Clinical Impression  Pt showed willingness to participate and good effort with PT exam, but was very weak and ultimately struggled to do much of any mobility.  Pt unable to maintain balance sitting at EOB and was able to get almost no assist on attempt at standing.  Pt very weak, but indicates that normally he is able to get around relatively well and that this is far from his baseline.  Pt did not want to have to go to rehab, but after working with PT realized how weak he is and that he is not safe to go home at this time.     Follow Up Recommendations SNF    Equipment Recommendations       Recommendations for Other Services       Precautions / Restrictions Precautions Precautions: Fall Restrictions Weight Bearing Restrictions: No      Mobility  Bed Mobility Overal bed mobility: Needs Assistance Bed Mobility: Supine to Sit;Sit to Supine     Supine to sit: Max assist Sit to supine: Max assist   General bed mobility comments: Pt showed good attempt to get to sitting, but ultimately was unable to even roll in bed or get hand to rail and needed heavy assist just to get to sitting position.  He was unable to maintain sitting balance even with a lot of assist and cuing - pt falling back and to the R  Transfers Overall transfer level: Needs  assistance Equipment used: Rolling walker (2 wheeled) Transfers: Sit to/from Stand Sit to Stand: Total assist         General transfer comment: Pt unable to get to standing even with heavy PT assist.  Pt indicated after the attempt that he knew he was weak but did not think he was this bad.   Ambulation/Gait             General Gait Details: unable/unsafe  Stairs            Wheelchair Mobility    Modified Rankin (Stroke Patients Only)       Balance Overall balance assessment: Needs assistance Sitting-balance support: Bilateral upper extremity supported Sitting balance-Leahy Scale: Poor     Standing balance support: Bilateral upper extremity supported Standing balance-Leahy Scale: Zero                               Pertinent Vitals/Pain Pain Assessment: 0-10 Pain Score: 4  Pain Location: low back pain (chronic?)    Home Living Family/patient expects to be discharged to:: Skilled nursing facility Living Arrangements: Spouse/significant other                    Prior Function           Comments: Pt reports that he is normally able to  get out for the house (son or other family member drives).       Hand Dominance        Extremity/Trunk Assessment   Upper Extremity Assessment Upper Extremity Assessment: Generalized weakness (no shld AROM b/l, grossly 2-/5 b/l elbow strength)    Lower Extremity Assessment Lower Extremity Assessment: Generalized weakness (minimal AROM in b/l LEs, grossly 2+/5)       Communication   Communication:  (very weak voice)  Cognition Arousal/Alertness: Awake/alert Behavior During Therapy:  (slow to respond to some questions/requests) Overall Cognitive Status: No family/caregiver present to determine baseline cognitive functioning                                 General Comments: Pt appeared generally to follow questioning and verbalizations seemed appropriate but again slow and  brief responses      General Comments      Exercises     Assessment/Plan    PT Assessment Patient needs continued PT services  PT Problem List Decreased strength;Decreased range of motion;Decreased activity tolerance;Decreased balance;Decreased mobility;Decreased coordination;Decreased cognition;Decreased knowledge of use of DME;Decreased safety awareness;Pain       PT Treatment Interventions Gait training;DME instruction;Functional mobility training;Therapeutic activities;Therapeutic exercise;Balance training;Neuromuscular re-education;Stair training;Cognitive remediation;Patient/family education    PT Goals (Current goals can be found in the Care Plan section)  Acute Rehab PT Goals Patient Stated Goal: get stronger, back to walking PT Goal Formulation: With patient Time For Goal Achievement: 01/18/17 Potential to Achieve Goals: Fair    Frequency Min 2X/week   Barriers to discharge        Co-evaluation               End of Session Equipment Utilized During Treatment: Gait belt Activity Tolerance: Patient limited by lethargy;Patient limited by fatigue Patient left: with call bell/phone within reach;with bed alarm set Nurse Communication: Mobility status PT Visit Diagnosis: Muscle weakness (generalized) (M62.81);Difficulty in walking, not elsewhere classified (R26.2)    Time: 1610-9604 PT Time Calculation (min) (ACUTE ONLY): 24 min   Charges:   PT Evaluation $PT Eval Low Complexity: 1 Procedure     PT G Codes:        Malachi Pro, DPT 01/04/2017, 3:21 PM

## 2017-01-04 NOTE — Progress Notes (Signed)
Patient is taking lamotrigine in a titrated schedule as started at [facility] on 12/16/16.  Patient's titration schedule: Take 25 mg twice daily for 2 weeks, Take 25 mg in the am and 50 mg nightly for 2 weeks, Take 50 mg twice daily.  Patient is currently on lamotrigine 25 mg in the am and 50 mg at bedtime (day 5)  Thomasene Ripple, PharmD, BCPS Clinical Pharmacist 01/04/2017

## 2017-01-04 NOTE — Progress Notes (Signed)
Subjective:   Patient is known to our practice from outpatient. He is followed by Dr. Wynelle Link for chronic kidney disease stage IV. He has chronic hyperkalemia and is managed with veltassa.  His baseline creatinine appears to be 2.60 from February Today's creatinine is 2.48/GFR 26 Potassium level at the time of presentation was 6.9. After treatment with Kayexalate and veltassa, Potassium is still high at 6.1  Nephrology consult has been requested for evaluation Patient also complains of back pain and weakness. X-ray of the back shows degenerative disc disease and degenerative lumbar spine disease  Objective:  Vital signs in last 24 hours:  Temp:  [98 F (36.7 C)-99.7 F (37.6 C)] 99.7 F (37.6 C) (03/31 0827) Pulse Rate:  [75-96] 84 (03/31 0827) Resp:  [18-29] 20 (03/31 0827) BP: (106-145)/(57-94) 128/64 (03/31 0827) SpO2:  [94 %-100 %] 100 % (03/31 0827) Weight:  [73.8 kg (162 lb 12.8 oz)-75.8 kg (167 lb)] 73.8 kg (162 lb 12.8 oz) (03/30 2351)  Weight change:  Filed Weights   01/03/17 1855 01/03/17 2351  Weight: 75.8 kg (167 lb) 73.8 kg (162 lb 12.8 oz)    Intake/Output:    Intake/Output Summary (Last 24 hours) at 01/04/17 1136 Last data filed at 01/04/17 1030  Gross per 24 hour  Intake              999 ml  Output              350 ml  Net              649 ml     Physical Exam: General: No acute distress, laying in the bed   HEENT Anicteric, moist oral mucous membranes   Neck Supple   Pulm/lungs Normal breathing effort   CVS/Heart No rub or gallop   Abdomen:  Soft, nontender, nondistended   Extremities: No peripheral edema   Neurologic: Alert, able to answer questions   Skin: No acute rashes           Basic Metabolic Panel:   Recent Labs Lab 01/03/17 1914 01/03/17 2132 01/04/17 0538  NA 136  --  136  K 6.9* 6.2* 6.1*  CL 107  --  109  CO2 22  --  21*  GLUCOSE 123*  --  181*  BUN 44*  --  38*  CREATININE 2.87*  --  2.48*  CALCIUM 9.6  --  9.1      CBC:  Recent Labs Lab 01/03/17 1914 01/04/17 0538  WBC 14.7* 11.7*  HGB 11.8* 10.2*  HCT 35.7* 31.0*  MCV 88.9 89.4  PLT 203 151      Microbiology:  No results found for this or any previous visit (from the past 720 hour(s)).  Coagulation Studies: No results for input(s): LABPROT, INR in the last 72 hours.  Urinalysis:  Recent Labs  01/03/17 2057  COLORURINE STRAW*  LABSPEC 1.010  PHURINE 8.0  GLUCOSEU >=500*  HGBUR NEGATIVE  BILIRUBINUR NEGATIVE  KETONESUR NEGATIVE  PROTEINUR NEGATIVE  NITRITE NEGATIVE  LEUKOCYTESUR NEGATIVE      Imaging: Dg Chest 2 View  Result Date: 01/03/2017 CLINICAL DATA:  Acute onset of generalized weakness and cough. Initial encounter. EXAM: CHEST  2 VIEW COMPARISON:  Chest radiograph from 11/04/2016 FINDINGS: The lungs are well-aerated. Minimal bibasilar atelectasis is noted. There is no evidence of pleural effusion or pneumothorax. The heart is normal in size; the mediastinal contour is within normal limits. No acute osseous abnormalities are seen. IMPRESSION: Minimal bibasilar atelectasis noted.  Lungs otherwise clear. Electronically Signed   By: Roanna Raider M.D.   On: 01/03/2017 20:47   Dg Lumbar Spine 2-3 Views  Result Date: 01/04/2017 CLINICAL DATA:  Low back pain. EXAM: LUMBAR SPINE - 2-3 VIEW COMPARISON:  CT scan November 24, 2016 FINDINGS: No fracture or malalignment. Multilevel degenerative disc disease with anterior osteophytes. Lower lumbar facet degenerative changes are seen as well. IMPRESSION: Degenerative disc disease and lower lumbar facet degenerative changes as above. Electronically Signed   By: Gerome Sam III M.D   On: 01/04/2017 09:13     Medications:   . sodium chloride 50 mL/hr at 01/04/17 0835   . divalproex  250 mg Oral Q12H  . donepezil  5 mg Oral QHS  . ferrous sulfate  325 mg Oral Daily  . folic acid  1 mg Oral Daily  . furosemide  20 mg Oral BH-q7a  . gabapentin  200 mg Oral BID  .  heparin  5,000 Units Subcutaneous Q8H  . insulin glargine  45 Units Subcutaneous Daily  . lamoTRIgine  25 mg Oral Daily  . lamoTRIgine  50 mg Oral QHS  . levETIRAcetam  500 mg Oral BID  . levothyroxine  75 mcg Oral QAC breakfast  . patiromer  8.4 g Oral Daily  . sodium bicarbonate  1,300 mg Oral TID   docusate sodium  Assessment/ Plan:  83 y.o.African Ameirc[place] male With type 2 diabetes, hypertension, seizure disorder, history of alcohol abuse in the past, gout  1. Hyperkalemia 2. Chronic kidney disease stage IV 3. Diabetes type 2 with chronic kidney disease 4. Anemia of chronic kidney disease  The cause for hyperkalemia is unclear but with having long-standing underlying insulin-dependent diabetes, there is possibility of hyporeninemic hypoaldosteronism. Patient will be continued on patiromer We will also get him started on low-dose Florinef and see if hit helps control potassium Currently also getting low-dose sodium chloride and furosemide  We will follow     LOS: 1 Phillip Osborne 3/31/201811:36 AM

## 2017-01-04 NOTE — Progress Notes (Signed)
[facility] -  at [facility]   PATIENT NAME: Phillip Osborne    MR#:  [id number]  DATE OF BIRTH:  [birth year]  SUBJECTIVE:  CHIEF COMPLAINT:   Chief Complaint  Patient presents with  . Weakness   Continues to feel weak. Mild back pain. No urinary retention. Afebrile.  REVIEW OF SYSTEMS:    Review of Systems  Constitutional: Positive for malaise/fatigue. Negative for chills and fever.  HENT: Negative for sore throat.   Eyes: Negative for blurred vision, double vision and pain.  Respiratory: Negative for cough, hemoptysis, shortness of breath and wheezing.   Cardiovascular: Negative for chest pain, palpitations, orthopnea and leg swelling.  Gastrointestinal: Negative for abdominal pain, constipation, diarrhea, heartburn, nausea and vomiting.  Genitourinary: Negative for dysuria and hematuria.  Musculoskeletal: Negative for back pain and joint pain.  Skin: Negative for rash.  Neurological: Positive for weakness. Negative for sensory change, speech change, focal weakness and headaches.  Endo/Heme/Allergies: Does not bruise/bleed easily.  Psychiatric/Behavioral: Negative for depression. The patient is not nervous/anxious.     DRUG ALLERGIES:   Allergies  Allergen Reactions  . Enalapril Swelling    Face swelling.  . Lisinopril Swelling    VITALS:  Blood pressure 128/64, pulse 84, temperature 99.7 F (37.6 C), temperature source Oral, resp. rate 20, height  (1.651 m), weight 73.8 kg (162 lb 12.8 oz), SpO2 100 %.  PHYSICAL EXAMINATION:   Physical Exam  GENERAL:  83 y.o.-year-old patient lying in the bed with no acute distress.  EYES: Pupils equal, round, reactive to light and accommodation. No scleral icterus. Extraocular muscles intact.  HEENT: Head atraumatic, normocephalic. Oropharynx and nasopharynx clear.  NECK:  Supple, no jugular venous distention. No thyroid enlargement, no tenderness.  LUNGS: Normal breath sounds bilaterally, no  wheezing, rales, rhonchi. No use of accessory muscles of respiration.  CARDIOVASCULAR: S1, S2 normal. No murmurs, rubs, or gallops.  ABDOMEN: Soft, nontender, nondistended. Bowel sounds present. No organomegaly or mass.  EXTREMITIES: No cyanosis, clubbing or edema b/l.    NEUROLOGIC: Cranial nerves II through XII are intact. No focal Motor or sensory deficits b/l.   PSYCHIATRIC: The patient is alert and oriented x 3.  SKIN: No obvious rash, lesion, or ulcer.   LABORATORY PANEL:   CBC  Recent Labs Lab 01/04/17 0538  WBC 11.7*  HGB 10.2*  HCT 31.0*  PLT 151   ------------------------------------------------------------------------------------------------------------------ Chemistries   Recent Labs Lab 01/04/17 0538  NA 136  K 6.1*  CL 109  CO2 21*  GLUCOSE 181*  BUN 38*  CREATININE 2.48*  CALCIUM 9.1   ------------------------------------------------------------------------------------------------------------------  Cardiac Enzymes  Recent Labs Lab 01/03/17 1914  TROPONINI <0.03   --------------------------------------------------------------------------------------------------------  RADIOLOGY:  Dg Chest 2 View  Result Date: 01/03/2017 CLINICAL DATA:  Acute onset of generalized weakness and cough. Initial encounter. EXAM: CHEST  2 VIEW COMPARISON:  Chest radiograph from 11/04/2016 FINDINGS: The lungs are well-aerated. Minimal bibasilar atelectasis is noted. There is no evidence of pleural effusion or pneumothorax. The heart is normal in size; the mediastinal contour is within normal limits. No acute osseous abnormalities are seen. IMPRESSION: Minimal bibasilar atelectasis noted.  Lungs otherwise clear. Electronically Signed   By: Roanna Raider M.D.   On: 01/03/2017 20:47   Dg Lumbar Spine 2-3 Views  Result Date: 01/04/2017 CLINICAL DATA:  Low back pain. EXAM: LUMBAR SPINE - 2-3 VIEW COMPARISON:  CT scan November 24, 2016 FINDINGS: No fracture or malalignment.  Multilevel degenerative disc disease with anterior osteophytes.  Lower lumbar facet degenerative changes are seen as well. IMPRESSION: Degenerative disc disease and lower lumbar facet degenerative changes as above. Electronically Signed   By: Gerome Sam III M.D   On: 01/04/2017 09:13   ASSESSMENT AND PLAN:   * Hyperkalemia Without EKG changes Has chronic issues with elevated potassium. Is on Veltessa Some improvement and hyperkalemia but still high at 6.1. We'll give 1 dose of Kayexalate. Start IV fluids. Discussed with Dr. Thedore Mins of nephrology.  * Chronic kidney disease stage IV     Appears stable  * Hypertension   Continue home medication, stable.  * Seizures   Continue home medications.  * Diabetes     Continue Lantus and keep on sliding scale coverage.  * Hypothyroidism   Continue levothyroxine.  * Generalized weakness. Physical therapy evaluation.  * Low back pain. Lumbar x-ray showed nothing acute. Degenerative changes.  All the records are reviewed and case discussed with Care Management/Social Worker Management plans discussed with the patient, family and they are in agreement.  CODE STATUS: FULL CODE  DVT Prophylaxis: SCDs  TOTAL TIME TAKING CARE OF THIS PATIENT: 35 minutes.   POSSIBLE D/C IN 1-2 DAYS, DEPENDING ON CLINICAL CONDITION.  Milagros Loll R M.D on 01/04/2017 at 12:16 PM  Between 7am to 6pm - Pager - [phone]  After 6pm go to [email or web address] - password [email or web address] [facility]  [facility]  Office  [phone]  CC: Primary care physician; Marisue Ivan, MD  Note: This dictation was prepared with Dragon dictation along with smaller phrase technology. Any transcriptional errors that result from this process are unintentional.

## 2017-01-05 LAB — BASIC METABOLIC PANEL
ANION GAP: 9 (ref 5–15)
BUN: 35 mg/dL — ABNORMAL HIGH (ref 6–20)
CALCIUM: 8.6 mg/dL — AB (ref 8.9–10.3)
CO2: 21 mmol/L — AB (ref 22–32)
Chloride: 106 mmol/L (ref 101–111)
Creatinine, Ser: 2.57 mg/dL — ABNORMAL HIGH (ref 0.61–1.24)
GFR, EST AFRICAN AMERICAN: 25 mL/min — AB (ref >60)
GFR, EST NON AFRICAN AMERICAN: 22 mL/min — AB (ref >60)
GLUCOSE: 121 mg/dL — AB (ref 65–99)
POTASSIUM: 3.9 mmol/L (ref 3.5–5.1)
Sodium: 136 mmol/L (ref 135–145)

## 2017-01-05 LAB — GLUCOSE, CAPILLARY
GLUCOSE-CAPILLARY: 171 mg/dL — AB (ref 65–99)
Glucose-Capillary: 113 mg/dL — ABNORMAL HIGH (ref 65–99)
Glucose-Capillary: 121 mg/dL — ABNORMAL HIGH (ref 65–99)
Glucose-Capillary: 152 mg/dL — ABNORMAL HIGH (ref 65–99)

## 2017-01-05 LAB — HEMOGLOBIN A1C
HEMOGLOBIN A1C: 8.4 % — AB (ref 4.8–5.6)
MEAN PLASMA GLUCOSE: 194 mg/dL

## 2017-01-05 MED ORDER — MEROPENEM-SODIUM CHLORIDE 500 MG/50ML IV SOLR
500.0000 mg | Freq: Two times a day (BID) | INTRAVENOUS | Status: DC
  Administered 2017-01-05 – 2017-01-06 (×3): 500 mg via INTRAVENOUS
  Filled 2017-01-05 (×4): qty 50

## 2017-01-05 MED ORDER — NITROFURANTOIN MONOHYD MACRO 100 MG PO CAPS: 100.0000 mg | ORAL_CAPSULE | Freq: Two times a day (BID) | ORAL | Status: DC

## 2017-01-05 MED ORDER — SODIUM CHLORIDE 0.9 % IV SOLN
INTRAVENOUS | Status: DC
  Administered 2017-01-05: 09:00:00 via INTRAVENOUS

## 2017-01-05 MED ORDER — LEVOTHYROXINE SODIUM 75 MCG PO TABS
75.0000 ug | ORAL_TABLET | Freq: Every day | ORAL | Status: DC
  Administered 2017-01-06 – 2017-01-07 (×2): 75 ug via ORAL
  Filled 2017-01-05 (×2): qty 1

## 2017-01-05 NOTE — Progress Notes (Addendum)
[facility] - [place] at [facility]   PATIENT NAME: Mel Langan    MR#:  [id number]  DATE OF BIRTH:  03/20/1932  SUBJECTIVE:  CHIEF COMPLAINT:   Chief Complaint  Patient presents with  . Weakness   Feels better today with his generalized weakness. No back pain. Afebrile.  REVIEW OF SYSTEMS:    Review of Systems  Constitutional: Positive for malaise/fatigue. Negative for chills and fever.  HENT: Negative for sore throat.   Eyes: Negative for blurred vision, double vision and pain.  Respiratory: Negative for cough, hemoptysis, shortness of breath and wheezing.   Cardiovascular: Negative for chest pain, palpitations, orthopnea and leg swelling.  Gastrointestinal: Negative for abdominal pain, constipation, diarrhea, heartburn, nausea and vomiting.  Genitourinary: Negative for dysuria and hematuria.  Musculoskeletal: Negative for back pain and joint pain.  Skin: Negative for rash.  Neurological: Positive for weakness. Negative for sensory change, speech change, focal weakness and headaches.  Endo/Heme/Allergies: Does not bruise/bleed easily.  Psychiatric/Behavioral: Negative for depression. The patient is not nervous/anxious.     DRUG ALLERGIES:   Allergies  Allergen Reactions  . Enalapril Swelling    Face swelling.  . Lisinopril Swelling    VITALS:  Blood pressure 106/61, pulse 88, temperature 98.5 F (36.9 C), temperature source Oral, resp. rate 18, height  (1.651 m), weight 73.8 kg (162 lb 12.8 oz), SpO2 94 %.  PHYSICAL EXAMINATION:   Physical Exam  GENERAL:  83 y.o.-year-old patient lying in the bed with no acute distress.  EYES: Pupils equal, round, reactive to light and accommodation. No scleral icterus. Extraocular muscles intact.  HEENT: Head atraumatic, normocephalic. Oropharynx and nasopharynx clear.  NECK:  Supple, no jugular venous distention. No thyroid enlargement, no tenderness.  LUNGS: Normal breath sounds bilaterally, no  wheezing, rales, rhonchi. No use of accessory muscles of respiration.  CARDIOVASCULAR: S1, S2 normal. No murmurs, rubs, or gallops.  ABDOMEN: Soft, nontender, nondistended. Bowel sounds present. No organomegaly or mass.  EXTREMITIES: No cyanosis, clubbing or edema b/l.    NEUROLOGIC: Cranial nerves II through XII are intact. No focal Motor or sensory deficits b/l.   PSYCHIATRIC: The patient is alert and oriented x 3.  SKIN: No obvious rash, lesion, or ulcer.   LABORATORY PANEL:   CBC  Recent Labs Lab 01/04/17 0538  WBC 11.7*  HGB 10.2*  HCT 31.0*  PLT 151   ------------------------------------------------------------------------------------------------------------------ Chemistries   Recent Labs Lab 01/05/17 0523  NA 136  K 3.9  CL 106  CO2 21*  GLUCOSE 121*  BUN 35*  CREATININE 2.57*  CALCIUM 8.6*   ------------------------------------------------------------------------------------------------------------------  Cardiac Enzymes  Recent Labs Lab 01/03/17 1914  TROPONINI <0.03   --------------------------------------------------------------------------------------------------------  RADIOLOGY:  Dg Chest 2 View  Result Date: 01/03/2017 CLINICAL DATA:  Acute onset of generalized weakness and cough. Initial encounter. EXAM: CHEST  2 VIEW COMPARISON:  Chest radiograph from 11/04/2016 FINDINGS: The lungs are well-aerated. Minimal bibasilar atelectasis is noted. There is no evidence of pleural effusion or pneumothorax. The heart is normal in size; the mediastinal contour is within normal limits. No acute osseous abnormalities are seen. IMPRESSION: Minimal bibasilar atelectasis noted.  Lungs otherwise clear. Electronically Signed   By: Roanna Raider M.D.   On: 01/03/2017 20:47   Dg Lumbar Spine 2-3 Views  Result Date: 01/04/2017 CLINICAL DATA:  Low back pain. EXAM: LUMBAR SPINE - 2-3 VIEW COMPARISON:  CT scan November 24, 2016 FINDINGS: No fracture or malalignment.  Multilevel degenerative disc disease with anterior osteophytes.  Lower lumbar facet degenerative changes are seen as well. IMPRESSION: Degenerative disc disease and lower lumbar facet degenerative changes as above. Electronically Signed   By: David  Williams IGerome Sam 01/04/2017 09:13   ASSESSMENT AND PLAN:   * Hyperkalemia Without EKG changes Has chronic issues with elevated potassium. Is on Veltessa Now normal after Kayexalate. 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 Has gram-negative rods. Recently had ESBL UTI with Escherichia coli. Sensitive to Macrobid. Start today.  * Chronic kidney disease stage IV     Appears stable  * Hypertension   Continue home medication, stable.  * Seizures   Continue home medications.  * Diabetes     Continue Lantus and keep on sliding scale coverage.  * Hypothyroidism   Continue levothyroxine.  * Generalized weakness. Physical therapy evaluation. Skilled nursing facility recommended  * Low back pain. Resolved  Lumbar x-ray showed nothing acute. Degenerative changes.  All the records are reviewed and case discussed with Care Management/Social Worker Management plans discussed with the patient, family and they are in agreement.  CODE STATUS: FULL CODE  DVT Prophylaxis: SCDs  TOTAL TIME TAKING CARE OF THIS PATIENT: 35 minutes.   POSSIBLE D/C IN 1-2 DAYS, DEPENDING ON CLINICAL CONDITION.  Milagros Loll R M.D on 01/05/2017 at 11:47 AM  Between 7am to 6pm - Pager - [phone]  After 6pm go to [email or web address] - password [email or web address] [facility]  [facility]  Office  [phone]  CC: Primary care physician; Marisue Ivan, MD  Note: This dictation was prepared with Dragon dictation along with smaller phrase technology. Any transcriptional errors that result from this process are unintentional.

## 2017-01-05 NOTE — Progress Notes (Signed)
Subjective:   Patient is known to our practice from outpatient. He is followed by Dr. Wynelle Link for chronic kidney disease stage IV. He has chronic hyperkalemia and is managed with veltassa.  His baseline creatinine appears to be 2.60 from February Potassium level at the time of presentation was 6.9.    Patient also complains of back pain and weakness. X-ray of the back shows degenerative disc disease and degenerative lumbar spine disease  Serum creatinine is about the same at 2.57. Potassium is now normal at 3.9. Patient was started on Florinef yesterday.  Objective:  Vital signs in last 24 hours:  Temp:  [97.8 F (36.6 C)-99 F (37.2 C)] 98.5 F (36.9 C) (04/01 0850) Pulse Rate:  [77-93] 88 (04/01 0850) Resp:  [16-18] 18 (04/01 0850) BP: (106-136)/(54-66) 106/61 (04/01 0850) SpO2:  [91 %-96 %] 94 % (04/01 0850)  Weight change:  Filed Weights   01/03/17 1855 01/03/17 2351  Weight: 75.8 kg (167 lb) 73.8 kg (162 lb 12.8 oz)    Intake/Output:    Intake/Output Summary (Last 24 hours) at 01/05/17 1049 Last data filed at 01/04/17 1800  Gross per 24 hour  Intake           470.83 ml  Output                0 ml  Net           470.83 ml     Physical Exam: General: No acute distress, laying in the bed   HEENT Anicteric, moist oral mucous membranes   Neck Supple   Pulm/lungs Normal breathing effort   CVS/Heart No rub or gallop   Abdomen:  Soft, nontender, nondistended   Extremities: No peripheral edema   Neurologic: Alert, able to answer questions   Skin: No acute rashes           Basic Metabolic Panel:   Recent Labs Lab 01/03/17 1914 01/03/17 2132 01/04/17 0538 01/05/17 0523  NA 136  --  136 136  K 6.9* 6.2* 6.1* 3.9  CL 107  --  109 106  CO2 22  --  21* 21*  GLUCOSE 123*  --  181* 121*  BUN 44*  --  38* 35*  CREATININE 2.87*  --  2.48* 2.57*  CALCIUM 9.6  --  9.1 8.6*     CBC:  Recent Labs Lab 01/03/17 1914 01/04/17 0538  WBC 14.7* 11.7*  HGB  11.8* 10.2*  HCT 35.7* 31.0*  MCV 88.9 89.4  PLT 203 151      Microbiology:  No results found for this or any previous visit (from the past 720 hour(s)).  Coagulation Studies: No results for input(s): LABPROT, INR in the last 72 hours.  Urinalysis:  Recent Labs  01/03/17 2057  COLORURINE STRAW*  LABSPEC 1.010  PHURINE 8.0  GLUCOSEU >=500*  HGBUR NEGATIVE  BILIRUBINUR NEGATIVE  KETONESUR NEGATIVE  PROTEINUR NEGATIVE  NITRITE NEGATIVE  LEUKOCYTESUR NEGATIVE      Imaging: Dg Chest 2 View  Result Date: 01/03/2017 CLINICAL DATA:  Acute onset of generalized weakness and cough. Initial encounter. EXAM: CHEST  2 VIEW COMPARISON:  Chest radiograph from 11/04/2016 FINDINGS: The lungs are well-aerated. Minimal bibasilar atelectasis is noted. There is no evidence of pleural effusion or pneumothorax. The heart is normal in size; the mediastinal contour is within normal limits. No acute osseous abnormalities are seen. IMPRESSION: Minimal bibasilar atelectasis noted.  Lungs otherwise clear. Electronically Signed   By: Beryle Beams.D.  On: 01/03/2017 20:47   Dg Lumbar Spine 2-3 Views  Result Date: 01/04/2017 CLINICAL DATA:  Low back pain. EXAM: LUMBAR SPINE - 2-3 VIEW COMPARISON:  CT scan November 24, 2016 FINDINGS: No fracture or malalignment. Multilevel degenerative disc disease with anterior osteophytes. Lower lumbar facet degenerative changes are seen as well. IMPRESSION: Degenerative disc disease and lower lumbar facet degenerative changes as above. Electronically Signed   By: Gerome Sam III M.D   On: 01/04/2017 09:13     Medications:   . sodium chloride 50 mL/hr at 01/05/17 0909   . divalproex  250 mg Oral Q12H  . donepezil  5 mg Oral QHS  . ferrous sulfate  325 mg Oral Daily  . fludrocortisone  0.1 mg Oral Daily  . folic acid  1 mg Oral Daily  . furosemide  20 mg Oral BH-q7a  . gabapentin  200 mg Oral BID  . heparin  5,000 Units Subcutaneous Q8H  . insulin  aspart  0-5 Units Subcutaneous QHS  . insulin aspart  0-9 Units Subcutaneous TID WC  . insulin glargine  45 Units Subcutaneous Daily  . lamoTRIgine  25 mg Oral Daily  . lamoTRIgine  50 mg Oral QHS  . levETIRAcetam  500 mg Oral BID  . levothyroxine  75 mcg Oral QAC breakfast  . patiromer  8.4 g Oral Daily  . sodium bicarbonate  1,300 mg Oral TID   docusate sodium  Assessment/ Plan:  83 y.o.African Ameircan male With type 2 diabetes, hypertension, seizure disorder, history of alcohol abuse in the past, gout  1. Hyperkalemia 2. Chronic kidney disease stage IV 3. Diabetes type 2 with chronic kidney disease 4. Anemia of chronic kidney disease  The cause for hyperkalemia is unclear but with having long-standing underlying insulin-dependent diabetes, there is possibility of hyporeninemic hypoaldosteronism. Patient will be continued on patiromer We will also Continue low-dose Florinef for hyperkalemia May DC supplemental IV saline  We will follow     LOS: 2 Asiana Benninger 4/1/201810:49 AM

## 2017-01-05 NOTE — Progress Notes (Signed)
Patient's discharge delayed due to UTI.  Patient daughter informed around noon.

## 2017-01-05 NOTE — Progress Notes (Signed)
Pharmacy Medication Monitoring - Antibiotic/VPA  Phillip Osborne is a 83 y.o. male admitted on 01/03/2017 with c/o weakness.   Urine ultures from 2/18 with ESBL E. Coli - was initially treated with Keflex, patient/caregiver reported being well at the time of final results and no additional treatment given  Urine cultures at this time with 80k GNR. Unable to treat with macrobid due to CrCl < 76ml/min  Will treat at this time with meropenem. Patient with history of seizures currently on valproate with plans to switch to lamotragine after completing taper (halfway through). Significant drug interaction with meropenem/valproate which results in substantial reduction on VPA levels with increased risk of breakthrough seizures.  Discussed with Dr. Elpidio Anis who would like to treat UTI with meropenem and monitor VPA levels.  Plan: Meropenem  IV Q12H  Will check VPA level on 4/3; Target VPA level: 50-143mcg/ml   Height:  (165.1 cm) Weight: 162 lb 12.8 oz (73.8 kg) IBW/kg (Calculated) : 61.5  Temp (24hrs), Avg:98.3 F (36.8 C), Min:97.8 F (36.6 C), Max:99 F (37.2 C)   Recent Labs Lab 01/03/17 1914 01/04/17 0538 01/05/17 0523  WBC 14.7* 11.7*  --   CREATININE 2.87* 2.48* 2.57*    Estimated Creatinine Clearance: 20.8 mL/min (A) (by C-G formula based on SCr of 2.57 mg/dL (H)).    Allergies  Allergen Reactions  . Enalapril Swelling    Face swelling.  . Lisinopril Swelling    Antimicrobials this admission: Meropenem 4/1 >>  Dose adjustments this admission:   Microbiology results: 4/1 UCx: > 80k GNR  Thank you for allowing pharmacy to be a part of this patient's care.  Miche Loughridge C 01/05/2017 12:46 PM

## 2017-01-06 LAB — GLUCOSE, CAPILLARY
GLUCOSE-CAPILLARY: 94 mg/dL (ref 65–99)
Glucose-Capillary: 224 mg/dL — ABNORMAL HIGH (ref 65–99)
Glucose-Capillary: 155 mg/dL — ABNORMAL HIGH (ref 65–99)
Glucose-Capillary: 232 mg/dL — ABNORMAL HIGH (ref 65–99)
Glucose-Capillary: 181 mg/dL — ABNORMAL HIGH (ref 65–99)

## 2017-01-06 LAB — URINE CULTURE: Culture: 80000 — AB

## 2017-01-06 MED ORDER — GUAIFENESIN-DM 100-10 MG/5ML PO SYRP
5.0000 mL | ORAL_SOLUTION | ORAL | Status: DC | PRN
  Administered 2017-01-06 – 2017-01-07 (×2): 5 mL via ORAL
  Filled 2017-01-06 (×2): qty 5

## 2017-01-06 MED ORDER — PIPERACILLIN-TAZOBACTAM 3.375 G IVPB
3.3750 g | Freq: Three times a day (TID) | INTRAVENOUS | Status: DC
  Administered 2017-01-06: 3.375 g via INTRAVENOUS
  Filled 2017-01-06 (×4): qty 50

## 2017-01-06 MED ORDER — PIPERACILLIN SOD-TAZOBACTAM SO 2.25 (2-0.25) G IV SOLR
3.3750 g | Freq: Three times a day (TID) | INTRAVENOUS | Status: DC
  Administered 2017-01-06 – 2017-01-07 (×3): 3.375 g via INTRAVENOUS
  Filled 2017-01-06 (×6): qty 3.38

## 2017-01-06 NOTE — Progress Notes (Signed)
Subjective:  Renal function relatively stable. Creatinine currently 2.57. Potassium now down to 3.9.   Objective:  Vital signs in last 24 hours:  Temp:  [98.9 F (37.2 C)-99.2 F (37.3 C)] 99.2 F (37.3 C) (04/02 0747) Pulse Rate:  [60-81] 76 (04/02 1343) Resp:  [14-16] 14 (04/02 1343) BP: (102-116)/(54-67) 102/54 (04/02 1343) SpO2:  [94 %-96 %] 96 % (04/02 1343)  Weight change:  Filed Weights   01/03/17 1855 01/03/17 2351  Weight: 75.8 kg (167 lb) 73.8 kg (162 lb 12.8 oz)    Intake/Output:    Intake/Output Summary (Last 24 hours) at 01/06/17 1602 Last data filed at 01/06/17 1534  Gross per 24 hour  Intake           185.83 ml  Output              200 ml  Net           -14.17 ml     Physical Exam: General: No acute distress, laying in the bed   HEENT Anicteric, moist oral mucous membranes   Neck Supple   Pulm/lungs Normal breathing effort, CTAB  CVS/Heart No rub or gallop S1S2   Abdomen:  Soft, nontender, nondistended   Extremities: No peripheral edema   Neurologic: Alert, able to answer questions   Skin: No acute rashes           Basic Metabolic Panel:   Recent Labs Lab 01/03/17 1914 01/03/17 2132 01/04/17 0538 01/05/17 0523  NA 136  --  136 136  K 6.9* 6.2* 6.1* 3.9  CL 107  --  109 106  CO2 22  --  21* 21*  GLUCOSE 123*  --  181* 121*  BUN 44*  --  38* 35*  CREATININE 2.87*  --  2.48* 2.57*  CALCIUM 9.6  --  9.1 8.6*     CBC:  Recent Labs Lab 01/03/17 1914 01/04/17 0538  WBC 14.7* 11.7*  HGB 11.8* 10.2*  HCT 35.7* 31.0*  MCV 88.9 89.4  PLT 203 151      Microbiology:  Recent Results (from the past 720 hour(s))  Urine culture     Status: Abnormal   Collection Time: 01/03/17  8:57 PM  Result Value Ref Range Status   Specimen Description URINE, RANDOM  Final   Special Requests NONE  Final   Culture (A)  Final    80,000 COLONIES/mL ESCHERICHIA COLI Confirmed Extended Spectrum Beta-Lactamase Producer (ESBL) Performed at  [facility], [address], [place], [place] [address]    Report Status 01/06/2017 FINAL  Final   Organism ID, Bacteria ESCHERICHIA COLI (A)  Final      Susceptibility   Escherichia coli - MIC*    AMPICILLIN >=32 RESISTANT Resistant     CEFAZOLIN >=64 RESISTANT Resistant     CEFTRIAXONE >=64 RESISTANT Resistant     CIPROFLOXACIN >=4 RESISTANT Resistant     GENTAMICIN >=16 RESISTANT Resistant     IMIPENEM <=0.25 SENSITIVE Sensitive     NITROFURANTOIN <=16 SENSITIVE Sensitive     TRIMETH/SULFA >=320 RESISTANT Resistant     AMPICILLIN/SULBACTAM >=32 RESISTANT Resistant     PIP/TAZO 8 SENSITIVE Sensitive     Extended ESBL POSITIVE Resistant     * 80,000 COLONIES/mL ESCHERICHIA COLI    Coagulation Studies: No results for input(s): LABPROT, INR in the last 72 hours.  Urinalysis:  Recent Labs  01/03/17 2057  COLORURINE STRAW*  LABSPEC 1.010  PHURINE 8.0  GLUCOSEU >=500*  HGBUR  NEGATIVE  BILIRUBINUR NEGATIVE  KETONESUR NEGATIVE  PROTEINUR NEGATIVE  NITRITE NEGATIVE  LEUKOCYTESUR NEGATIVE      Imaging: No results found.   Medications:    . divalproex  250 mg Oral Q12H  . donepezil  5 mg Oral QHS  . ferrous sulfate  325 mg Oral Daily  . fludrocortisone  0.1 mg Oral Daily  . folic acid  1 mg Oral Daily  . furosemide  20 mg Oral BH-q7a  . gabapentin  200 mg Oral BID  . heparin  5,000 Units Subcutaneous Q8H  . insulin aspart  0-5 Units Subcutaneous QHS  . insulin aspart  0-9 Units Subcutaneous TID WC  . insulin glargine  45 Units Subcutaneous Daily  . lamoTRIgine  25 mg Oral Daily  . lamoTRIgine  50 mg Oral QHS  . levETIRAcetam  500 mg Oral BID  . levothyroxine  75 mcg Oral QAC breakfast  . patiromer  8.4 g Oral Daily  . piperacillin-tazobactam (ZOSYN)  IV  3.375 g Intravenous Q8H  . sodium bicarbonate  1,300 mg Oral TID   docusate sodium  Assessment/ Plan:  83 y.o.African [place] male With type 2 diabetes, hypertension, seizure disorder, history of  alcohol abuse in the past, gout  1. Hyperkalemia 2. Chronic kidney disease stage IV 3. Diabetes type 2 with chronic kidney disease 4. Anemia of chronic kidney disease  -  Serum phosphorus much improved and down down to 3.9.  Question as to whether full adherence with patiromer is an issue.  Given improvement in serum potassium recommend continuation of patiromer as well as Florinef.  We will continue to monitor the patient's progress as an outpatient as well.     LOS: 3 Jasiah Elsen 4/2/20184:02 PM

## 2017-01-06 NOTE — Plan of Care (Signed)
Problem: Physical Regulation: Goal: Ability to maintain clinical measurements within normal limits will improve Outcome: Progressing VS stable. No c/o adverse symptoms as per admission.  PT will being exercise strengthening Tomorrow.

## 2017-01-06 NOTE — NC FL2 (Signed)
[place] MEDICAID FL2 LEVEL OF CARE SCREENING TOOL     IDENTIFICATION  Patient Name: Phillip Osborne Birthdate: 06-25-1932 Sex: male Admission Date (Current Location): 01/03/2017  [place] and [place] Number:  [address] [id number] Q Facility and Address:  [facility], [address] [place], [place] [address]      Provider Number: [id number]  Attending Physician Name and Address:  Katha Hamming, MD  Relative Name and Phone Number:  Latina Craver Daughter [phone] or Powell,Ramona Significant other [phone]  [phone] or Quackenbush,Cynthia Daughter [phone] Stallbaumer,Cynthia Daughter [phone]     Current Level of Care: Hospital Recommended Level of Care: Skilled Nursing Facility Prior Approval Number:    Date Approved/Denied:   PASRR Number: [id number]  Discharge Plan: SNF    Current Diagnoses: Patient Active Problem List   Diagnosis Date Noted  . Hyperkalemia 11/04/2016  . Left ureteral stone 02/20/2016  . Gross hematuria 02/20/2016    Orientation RESPIRATION BLADDER Height & Weight     Self, Place, Situation, Time  Normal Continent Weight: 162 lb 12.8 oz (73.8 kg) Height:   (165.1 cm)  BEHAVIORAL SYMPTOMS/MOOD NEUROLOGICAL BOWEL NUTRITION STATUS      Continent Diet (Heart Healthy)  AMBULATORY STATUS COMMUNICATION OF NEEDS Skin   Limited Assist Verbally Normal                       Personal Care Assistance Level of Assistance  Feeding, Dressing, Bathing Bathing Assistance: Limited assistance Feeding assistance: Independent Dressing Assistance: Limited assistance     Functional Limitations Info  Sight, Speech, Hearing Sight Info: Adequate Hearing Info: Adequate Speech Info: Adequate    SPECIAL CARE FACTORS FREQUENCY  PT (By licensed PT)     PT Frequency: 5x a week              Contractures Contractures Info: Not present    Additional Factors Info  Psychotropic, Insulin Sliding Scale      Psychotropic Info: divalproex (DEPAKOTE SPRINKLE) capsule 250 mg Insulin Sliding Scale Info: insulin aspart (novoLOG) injection 0-9 Units 3x a day with meals.       Current Medications (01/06/2017):  This is the current hospital active medication list Current Facility-Administered Medications  Medication Dose Route Frequency Provider Last Rate Last Dose  . divalproex (DEPAKOTE SPRINKLE) capsule 250 mg  250 mg Oral Q12H Altamese Dilling, MD   250 mg at 01/06/17 0909  . docusate sodium (COLACE) capsule 100 mg  100 mg Oral BID PRN Altamese Dilling, MD      . donepezil (ARICEPT) tablet 5 mg  5 mg Oral QHS Altamese Dilling, MD   5 mg at 01/05/17 2226  . ferrous sulfate tablet 325 mg  325 mg Oral Daily Altamese Dilling, MD   325 mg at 01/06/17 0908  . fludrocortisone (FLORINEF) tablet 0.1 mg  0.1 mg Oral Daily Harmeet Singh, MD   0.1 mg at 01/06/17 0909  . folic acid (FOLVITE) tablet 1 mg  1 mg Oral Daily Altamese Dilling, MD   1 mg at 01/06/17 0908  . furosemide (LASIX) tablet 20 mg  20 mg Oral Daymon Larsen, MD   20 mg at 01/06/17 0644  . gabapentin (NEURONTIN) capsule 200 mg  200 mg Oral BID Altamese Dilling, MD   200 mg at 01/06/17 0908  . heparin injection 5,000 Units  5,000 Units Subcutaneous Q8H Altamese Dilling, MD   5,000 Units at 01/06/17 1554  . insulin aspart (novoLOG) injection 0-5 Units  0-5 Units Subcutaneous  QHS Srikar Sudini, MD      . insulin aspart (novoLOG) injection 0-9 Units  0-9 Units Subcutaneous TID WC Milagros Loll, MD   3 Units at 01/06/17 1236  . insulin glargine (LANTUS) injection 45 Units  45 Units Subcutaneous Daily Altamese Dilling, MD   45 Units at 01/06/17 0910  . lamoTRIgine (LAMICTAL) tablet 25 mg  25 mg Oral Daily Altamese Dilling, MD   25 mg at 01/06/17 0909  . lamoTRIgine (LAMICTAL) tablet 50 mg  50 mg Oral QHS Altamese Dilling, MD   50 mg at 01/05/17 2230  . levETIRAcetam (KEPPRA) tablet 500 mg   500 mg Oral BID Altamese Dilling, MD   500 mg at 01/06/17 0908  . levothyroxine (SYNTHROID, LEVOTHROID) tablet 75 mcg  75 mcg Oral QAC breakfast Delsa Bern, RPH   75 mcg at 01/06/17 1191  . patiromer Lelon Perla) packet 8.4 g  8.4 g Oral Daily Altamese Dilling, MD   8.4 g at 01/06/17 1237  . piperacillin-tazobactam (ZOSYN) IVPB 3.375 g  3.375 g Intravenous Q8H Katha Hamming, MD   3.375 g at 01/06/17 1554  . sodium bicarbonate tablet 1,300 mg  1,300 mg Oral TID Altamese Dilling, MD   1,300 mg at 01/06/17 1554     Discharge Medications: Please see discharge summary for a list of discharge medications.  Relevant Imaging Results:  Relevant Lab Results:   Additional Information SSN [id number]  Darleene Cleaver, [place]

## 2017-01-06 NOTE — Progress Notes (Signed)
[facility] [place] [facility]   PATIENT NAME: Phillip Osborne    MR#:  [id number]  DATE OF BIRTH:  Dec 26, 1931  SUBJECTIVE: he denies any complaints, refusing to go to rehabilitation   CHIEF COMPLAINT:   Chief Complaint  Patient presents with  . Weakness   Feels better today with his generalized weakness. No back pain. Afebrile.  REVIEW OF SYSTEMS:    Review of Systems  Constitutional: Positive for malaise/fatigue. Negative for chills and fever.  HENT: Negative for sore throat.   Eyes: Negative for blurred vision, double vision and pain.  Respiratory: Negative for cough, hemoptysis, shortness of breath and wheezing.   Cardiovascular: Negative for chest pain, palpitations, orthopnea and leg swelling.  Gastrointestinal: Negative for abdominal pain, constipation, diarrhea, heartburn, nausea and vomiting.  Genitourinary: Negative for dysuria and hematuria.  Musculoskeletal: Negative for back pain and joint pain.  Skin: Negative for rash.  Neurological: Positive for weakness. Negative for sensory change, speech change, focal weakness and headaches.  Endo/Heme/Allergies: Does not bruise/bleed easily.  Psychiatric/Behavioral: Negative for depression. The patient is not nervous/anxious.     DRUG ALLERGIES:   Allergies  Allergen Reactions  . Enalapril Swelling    Face swelling.  . Lisinopril Swelling    VITALS:  Blood pressure 111/61, pulse 77, temperature 99.2 F (37.3 C), temperature source Oral, resp. rate 16, height  (1.651 m), weight 73.8 kg (162 lb 12.8 oz), SpO2 96 %.  PHYSICAL EXAMINATION:   Physical Exam  GENERAL:  83 y.o.-year-old patient lying in the bed with no acute distress.  EYES: Pupils equal, round, reactive to light and accommodation. No scleral icterus. Extraocular muscles intact.  HEENT: Head atraumatic, normocephalic. Oropharynx and nasopharynx clear.  NECK:  Supple, no jugular venous distention. No thyroid enlargement, no  tenderness.  LUNGS: Normal breath sounds bilaterally, no wheezing, rales, rhonchi. No use of accessory muscles of respiration.  CARDIOVASCULAR: S1, S2 normal. No murmurs, rubs, or gallops.  ABDOMEN: Soft, nontender, nondistended. Bowel sounds present. No organomegaly or mass.  EXTREMITIES: No cyanosis, clubbing or edema b/l.    NEUROLOGIC: Cranial nerves II through XII are intact. No focal Motor or sensory deficits b/l.   PSYCHIATRIC: The patient is alert and oriented x 3.  SKIN: No obvious rash, lesion, or ulcer.   LABORATORY PANEL:   CBC  Recent Labs Lab 01/04/17 0538  WBC 11.7*  HGB 10.2*  HCT 31.0*  PLT 151   ------------------------------------------------------------------------------------------------------------------ Chemistries   Recent Labs Lab 01/05/17 0523  NA 136  K 3.9  CL 106  CO2 21*  GLUCOSE 121*  BUN 35*  CREATININE 2.57*  CALCIUM 8.6*   ------------------------------------------------------------------------------------------------------------------  Cardiac Enzymes  Recent Labs Lab 01/03/17 1914  TROPONINI <0.03   --------------------------------------------------------------------------------------------------------  RADIOLOGY:  No results found. ASSESSMENT AND PLAN:   * Hyperkalemia Without EKG changes Has chronic issues with elevated potassium. Is on Veltessa Now normal after Kayexalate. 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 Has gram-negative rods. Recently had ESBL UTI with Escherichia coli. Patient is not able to get Macrobid because of less creatinine clearance. On ertapenem. Follow valproic acid levels daily on meropenem. * Chronic kidney disease stage IV     Appears stable  * Hypertension   Continue home medication, stable.  * Seizures   Continue home medications.  * Diabetes     Continue Lantus and keep on sliding scale coverage.  *  Hypothyroidism   Continue  levothyroxine.  * Generalized weakness. Physical therapy evaluation. Skilled nursing facility recommended. Wife is requesting placement. Social worker is aware.  * Low back pain. Resolved  Lumbar x-ray showed nothing acute. Degenerative changes.  All the records are reviewed and case discussed with Care Management/Social Worker Management plans discussed with the patient, family and they are in agreement.  CODE STATUS: FULL CODE  DVT Prophylaxis: SCDs  TOTAL TIME TAKING CARE OF THIS PATIENT: 35 minutes.   POSSIBLE D/C IN 1-2 DAYS, DEPENDING ON CLINICAL CONDITION.  Katha Hamming M.D on 01/06/2017 at 12:58 PM  Between 7am to 6pm - Pager - [phone]  After 6pm go to [email or web address] - password [email or web address] [facility]  [facility] [place] [facility]  Office  [phone]  CC: Primary care physician; Marisue Ivan, MD  Note: This dictation was prepared with Dragon dictation along with smaller phrase technology. Any transcriptional errors that result from this process are unintentional.

## 2017-01-07 LAB — CBC
HCT: 29.6 % — ABNORMAL LOW (ref 40.0–52.0)
Hemoglobin: 10.1 g/dL — ABNORMAL LOW (ref 13.0–18.0)
MCH: 29.6 pg (ref 26.0–34.0)
MCHC: 34.1 g/dL (ref 32.0–36.0)
MCV: 86.8 fL (ref 80.0–100.0)
PLATELETS: 193 10*3/uL (ref 150–440)
RBC: 3.41 MIL/uL — AB (ref 4.40–5.90)
RDW: 15 % — ABNORMAL HIGH (ref 11.5–14.5)
WBC: 8.6 10*3/uL (ref 3.8–10.6)

## 2017-01-07 LAB — GLUCOSE, CAPILLARY
GLUCOSE-CAPILLARY: 197 mg/dL — AB (ref 65–99)
Glucose-Capillary: 82 mg/dL (ref 65–99)
Glucose-Capillary: 164 mg/dL — ABNORMAL HIGH (ref 65–99)

## 2017-01-07 MED ORDER — LAMOTRIGINE 25 MG PO TABS
50.0000 mg | ORAL_TABLET | Freq: Every day | ORAL | 0 refills | Status: DC
Start: 2017-01-07 — End: 2017-05-06

## 2017-01-07 MED ORDER — FLUDROCORTISONE ACETATE 0.1 MG PO TABS: 0.1000 mg | ORAL_TABLET | Freq: Every day | ORAL | 0 refills | Status: DC

## 2017-01-07 MED ORDER — DIVALPROEX SODIUM 125 MG PO CSDR: 250.0000 mg | DELAYED_RELEASE_CAPSULE | Freq: Two times a day (BID) | ORAL | 0 refills | Status: DC

## 2017-01-07 MED ORDER — LAMOTRIGINE 25 MG PO TABS: 25.0000 mg | ORAL_TABLET | Freq: Every day | ORAL | 0 refills | Status: AC

## 2017-01-07 NOTE — Progress Notes (Signed)
qPhysical Therapy Treatment Patient Details Name: Phillip Osborne MRN: [id number] DOB: 09/29/1932 Today's Date: 01/07/2017    History of Present Illness  83 y.o. male with a known history of Chronic kidney disease stage IV, diabetes, hypertension, seizures, thyroid disease- had some change in his seizure medications recently and for last 2 days he started feeling more weakness in his legs but today it was very severe and he could not even get out of so concerned with this came to emergency room. Along with this he also complained of some lower back pain for last few days. Admitted with hyperkemia, K= has improved (6.1 for PT exam)    PT Comments    Pt ready for session.  Participated in exercises as described below.  Pt was able to get to edge of bed with rail and increased time but no direct assist.  Sitting balance with supervision.  He stood with min assist x 2 initially and was able to complete standing exercises without buckling or significant LOB.  He then ambulated 25' in room with min guard x 2.  After short seated rest, he was able to continue gait in room 78' with frequent turns and no LOB's.  Pt does need frequent vc's for safety with hand placements but overall had had significant improvement since evaluation.    SNF remains appropriate due to general strength and balance deficits.  Pt is resistant to rehab at this time.  If pt refuses HHPT would be recommended.    Follow Up Recommendations  SNF     Equipment Recommendations       Recommendations for Other Services       Precautions / Restrictions Precautions Precautions: Fall Restrictions Weight Bearing Restrictions: No    Mobility  Bed Mobility Overal bed mobility: Modified Independent Bed Mobility: Supine to Sit   Sidelying to sit: Supervision;HOB elevated       General bed mobility comments: increased time and use of rails but able to do without direct assist today.  Transfers Overall transfer level: Needs  assistance Equipment used: Rolling walker (2 wheeled) Transfers: Sit to/from Stand Sit to Stand: Min guard         General transfer comment: initially +2 for safety but by end of session was able to do safely with +1 assist  Ambulation/Gait Ambulation/Gait assistance: Min guard Ambulation Distance (Feet): 80 Feet Assistive device: Rolling walker (2 wheeled) Gait Pattern/deviations: Step-through pattern;Decreased step length - right;Decreased step length - left   Gait velocity interpretation: Below normal speed for age/gender General Gait Details: significant improvent since evaluation with overall gait and mobility   Stairs            Wheelchair Mobility    Modified Rankin (Stroke Patients Only)       Balance Overall balance assessment: Needs assistance Sitting-balance support: Feet supported Sitting balance-Leahy Scale: Fair     Standing balance support: Bilateral upper extremity supported Standing balance-Leahy Scale: Fair                              Cognition Arousal/Alertness: Awake/alert Behavior During Therapy: WFL for tasks assessed/performed Overall Cognitive Status: Within Functional Limits for tasks assessed                                        Exercises Other Exercises Other Exercises: 5X sit to  stand with BUE to assist with stand. Other Exercises: standing marches, SLR, toe raises x 10 bilaterally with BUE support , supine x 10 for ankle pumps, heel slides, SLR, and ab/add BLE     General Comments        Pertinent Vitals/Pain Pain Assessment: No/denies pain    Home Living                      Prior Function            PT Goals (current goals can now be found in the care plan section) Progress towards PT goals: Progressing toward goals    Frequency    Min 2X/week      PT Plan Current plan remains appropriate    Co-evaluation             End of Session Equipment Utilized During  Treatment: Gait belt Activity Tolerance: Patient tolerated treatment well Patient left: in chair;with chair alarm set;with call bell/phone within reach;with family/visitor present         Time: 1610-9604 PT Time Calculation (min) (ACUTE ONLY): 30 min  Charges:  $Gait Training: 8-22 mins $Therapeutic Exercise: 8-22 mins                    G Codes:       Danielle Dess, PTA 01/07/17, 11:33 AM

## 2017-01-07 NOTE — Clinical Social Work Note (Signed)
Patient to be d/c'ed today to [facility]  Patient and family agreeable to plans will transport via son's car RN to call report to [phone] room 4B.  Windell Moulding, MSW, Theresia Majors [phone]

## 2017-01-07 NOTE — Clinical Social Work Note (Signed)
Clinical Social Work Assessment  Patient Details  Name: Phillip Osborne MRN: [id number] Date of Birth: December 05, 1931  Date of referral:  01/07/17               Reason for consult:  Facility Placement                Permission sought to share information with:  Facility Medical sales representative, Family Supports Permission granted to share information::  Yes, Verbal Permission Granted  Name::     Moore,Shelby Daughter [phone] or Powell,Ramona Significant other [phone]  [phone] or Ogle,Cynthia Daughter [phone] Boghosian,Cynthia Daughter [phone]   Agency::  SNF admissions  Relationship::     Contact Information:     Housing/Transportation Living arrangements for the past 2 months:  Single Family Home Source of Information:  Patient, Adult Children Patient Interpreter Needed:  None Criminal Activity/Legal Involvement Pertinent to Current Situation/Hospitalization:  No - Comment as needed Significant Relationships:  Adult Children, Significant Other Lives with:  Significant Other Do you feel safe going back to the place where you live?  No Need for family participation in patient care:  Yes (Comment)  Care giving concerns:  Patient really does not want to go to rehab at SNF, but he has agreed.  Patient's family feel he needs some rehab before he is able to return back home.   Social Worker assessment / plan:  Patient is a 83 year old male who lives with his significant other and is alert and oriented x4.  Patient states he has been to SNF in the past, but ended up staying as a long term care resident.  Patient expressed that he does not want to stay as a long term care resident again, but is agreeable to going to SNF for short term rehab.  Patient was explained how insurance will pay for his stay, and what to expect at SNF.  Patient is agreeable to going to SNF in [facility].  Patient was explained role of social worker and the process for finding a SNF bed.   Patient and family did not express any other questions or concerns.  Employment status:  Retired Database administrator, [employer] [place] PT Recommendations:  Skilled Nursing Facility Information / Referral to community resources:     Patient/Family's Response to care:  Patient and family are agreeable to going to SNF for short term rehab.  Patient/Family's Understanding of and Emotional Response to Diagnosis, Current Treatment, and Prognosis:  Patient is not very happy about going to SNF, but is agreeable to it for a short term.  Emotional Assessment Appearance:  Appears stated age Attitude/Demeanor/Rapport:    Affect (typically observed):  Appropriate, Calm Orientation:  Oriented to Self, Oriented to Place, Oriented to  Time Alcohol / Substance use:  Not Applicable Psych involvement (Current and /or in the community):  No (Comment)  Discharge Needs  Concerns to be addressed:  Lack of Support Readmission within the last 30 days:  No Current discharge risk:  Lack of support system Barriers to Discharge:      [place] Constable 01/07/2017, 2:40 PM

## 2017-01-07 NOTE — Discharge Summary (Signed)
Phillip Osborne, is a 83 y.o. male  DOB [birth year]  MRN [id number].  Admission date:  01/03/2017  Admitting Physician  Altamese Dilling, MD  Discharge Date:  01/07/2017   Primary MD  Marisue Ivan, MD  Recommendations for primary care physician for things to follow:  Follow-up with primary neurologist in 1 week   Admission Diagnosis  Hyperkalemia [E87.5] Weakness [R53.1] Pain [R52]   Discharge Diagnosis  Hyperkalemia [E87.5] Weakness [R53.1] Pain [R52]    Principal Problem:   Hyperkalemia      Past Medical History:  Diagnosis Date  . Diabetes mellitus without complication (HCC)   . Gout   . Hypertension   . Kidney stone   . Seizures (HCC)   . Thyroid disease     Past Surgical History:  Procedure Laterality Date  . EXTRACORPOREAL SHOCK WAVE LITHOTRIPSY Left 02/22/2016   Procedure: EXTRACORPOREAL SHOCK WAVE LITHOTRIPSY (ESWL);  Surgeon: Hildred Laser, MD;  Location: [facility] ORS;  Service: Urology;  Laterality: Left;  . HERNIA REPAIR  70's       History of present illness and  Hospital Course:     Kindly see H&P for history of present illness and admission details, please review complete Labs, Consult reports and Test reports for all details in brief  HPI  from the history and physical done on the day of admission  83 year old male patient with history of chronic kidney disease stage IV, diabetes mellitus type 2, essential hypertension, seizures, came because of weakness in the legs, low back pain. Potassium 6.9 on admission. But no EKG changes.  Hospital Course  1.severe hyperkalemia without EKG changes, patient received insulin, dextrose. Patient has history of hyperkalemia and he is  Scientist, research (physical sciences). Seen by nephrology. potassium improved to 3.9 on the labs on April 1. #2 generalized weakness,  physical therapy recommended skilled nursing facility. In terms [facility], discharge today. 3, history of seizure disorder: Followed by Dr..Hemag shah neurology, patient is on Keppra 500 mg by mouth twice a day, he is in the process of titrating her Depakote off, started on Lamictal on March 12, Will switch from Depakote to lamotrigine:  - Start lamotrigine  twice daily for two weeks, then take  in the morning and  at night for two weeks, then  twice daily.  - After taking full dose of lamotrigine for two weeks, decrease Depakote to  in the morning and  at night for one week, then take  twice daily for one week, then take  at night for one week then stop Follow-up with neurology on May 12 for follow-up of seizures, tremors.  For ESBL UTI: Urine culture showed less than 100,000 colonies of ESBL on this admission, patient received a Zosyn for 3 days.  #4 essential hypertension #5 dementia #6 diabetes mellitus type 2 CK D stage IV with the hyperkalemia continue VAltesa,    Discharge Condition: stable   Follow UP  Contact information for after-discharge care    Destination    [facility] .   Specialty:  Skilled Nursing Facility Contact information: [address] [place] [address] [phone]                Discharge Instructions  and  Discharge Medications      Allergies as of 01/07/2017      Reactions   Enalapril Swelling   Face swelling.   Lisinopril Swelling      Medication List    TAKE these medications  ACCU-CHEK AVIVA test strip Generic drug:  glucose blood 1 each by Other route 3 (three) times daily. (Fasting and 2 hours after largest meal)   divalproex 125 MG capsule Commonly known as:  DEPAKOTE SPRINKLE Take 2 capsules (250 mg total) by mouth every 12 (twelve) hours.   donepezil 5 MG tablet Commonly known as:  ARICEPT Take 5 mg by mouth at bedtime.   febuxostat 40 MG  tablet Commonly known as:  ULORIC Take 40 mg by mouth daily.   ferrous sulfate 325 (65 FE) MG tablet Take 325 mg by mouth daily.   fludrocortisone 0.1 MG tablet Commonly known as:  FLORINEF Take 1 tablet (0.1 mg total) by mouth daily. Start taking on:  01/08/2017   folic acid 1 MG tablet Commonly known as:  FOLVITE Take 1 mg by mouth daily.   furosemide 20 MG tablet Commonly known as:  LASIX Take 20 mg by mouth every morning.   gabapentin 100 MG capsule Commonly known as:  NEURONTIN Take 200 mg by mouth 2 (two) times daily.   insulin glargine 100 UNIT/ML injection Commonly known as:  LANTUS Inject 45 Units into the skin daily.   insulin lispro 100 UNIT/ML injection Commonly known as:  HUMALOG Inject 5 Units into the skin 3 (three) times daily before meals. (Hold if CBG is less than 100)   lamoTRIgine 25 MG tablet Commonly known as:  LAMICTAL Take 2 tablets (50 mg total) by mouth at bedtime. What changed:  You were already taking a medication with the same name, and this prescription was added. Make sure you understand how and when to take each.   lamoTRIgine 25 MG tablet Commonly known as:  LAMICTAL Take 1 tablet (25 mg total) by mouth daily. Start taking on:  01/08/2017 What changed:  how much to take  when to take this  additional instructions   levETIRAcetam 500 MG tablet Commonly known as:  KEPPRA Take 500 mg by mouth 2 (two) times daily.   levothyroxine 75 MCG tablet Commonly known as:  SYNTHROID, LEVOTHROID Take 75 mcg by mouth daily.   patiromer 8.4 g packet Commonly known as:  VELTASSA Take 1 packet (8.4 g total) by mouth daily.   sodium bicarbonate 650 MG tablet Take 1,300 mg by mouth 3 (three) times daily.   Valproic Acid 250 MG Cpdr Take 250 mg by mouth 2 (two) times daily.         Diet and Activity recommendation: See Discharge Instructions above   Consults obtained - Nephrology,PT   Major procedures and Radiology Reports - PLEASE  review detailed and final reports for all details, in brief -     Dg Chest 2 View  Result Date: 01/03/2017 CLINICAL DATA:  Acute onset of generalized weakness and cough. Initial encounter. EXAM: CHEST  2 VIEW COMPARISON:  Chest radiograph from 11/04/2016 FINDINGS: The lungs are well-aerated. Minimal bibasilar atelectasis is noted. There is no evidence of pleural effusion or pneumothorax. The heart is normal in size; the mediastinal contour is within normal limits. No acute osseous abnormalities are seen. IMPRESSION: Minimal bibasilar atelectasis noted.  Lungs otherwise clear. Electronically Signed   By: Roanna Raider M.D.   On: 01/03/2017 20:47   Dg Lumbar Spine 2-3 Views  Result Date: 01/04/2017 CLINICAL DATA:  Low back pain. EXAM: LUMBAR SPINE - 2-3 VIEW COMPARISON:  CT scan November 24, 2016 FINDINGS: No fracture or malalignment. Multilevel degenerative disc disease with anterior osteophytes. Lower lumbar facet degenerative changes are seen as well.  IMPRESSION: Degenerative disc disease and lower lumbar facet degenerative changes as above. Electronically Signed   By: Gerome Sam III M.D   On: 01/04/2017 09:13    Micro Results     Recent Results (from the past 240 hour(s))  Urine culture     Status: Abnormal   Collection Time: 01/03/17  8:57 PM  Result Value Ref Range Status   Specimen Description URINE, RANDOM  Final   Special Requests NONE  Final   Culture (A)  Final    80,000 COLONIES/mL ESCHERICHIA COLI Confirmed Extended Spectrum Beta-Lactamase Producer (ESBL) Performed at [facility], [address] [place], [place] [address]    Report Status 01/06/2017 FINAL  Final   Organism ID, Bacteria ESCHERICHIA COLI (A)  Final      Susceptibility   Escherichia coli - MIC*    AMPICILLIN >=32 RESISTANT Resistant     CEFAZOLIN >=64 RESISTANT Resistant     CEFTRIAXONE >=64 RESISTANT Resistant     CIPROFLOXACIN >=4 RESISTANT Resistant     GENTAMICIN >=16 RESISTANT Resistant      IMIPENEM <=0.25 SENSITIVE Sensitive     NITROFURANTOIN <=16 SENSITIVE Sensitive     TRIMETH/SULFA >=320 RESISTANT Resistant     AMPICILLIN/SULBACTAM >=32 RESISTANT Resistant     PIP/TAZO 8 SENSITIVE Sensitive     Extended ESBL POSITIVE Resistant     * 80,000 COLONIES/mL ESCHERICHIA COLI       Today   Subjective:   Phillip Osborne today has no headache,no chest abdominal pain,stable for discharge  To rehab today. Objective:   Blood pressure 105/64, pulse 82, temperature 98.9 F (37.2 C), temperature source Oral, resp. rate 16, height  (1.651 m), weight 73.8 kg (162 lb 12.8 oz), SpO2 96 %.   Intake/Output Summary (Last 24 hours) at 01/07/17 1440 Last data filed at 01/07/17 0925  Gross per 24 hour  Intake              330 ml  Output              475 ml  Net             -145 ml    Exam Awake Alert, Oriented x 3, No new F.N deficits, Normal affect .AT,PERRAL Supple Neck,No JVD, No cervical lymphadenopathy appriciated.  Symmetrical Chest wall movement, Good air movement bilaterally, CTAB RRR,No Gallops,Rubs or new Murmurs, No Parasternal Heave +ve B.Sounds, Abd Soft, Non tender, No organomegaly appriciated, No rebound -guarding or rigidity. No Cyanosis, Clubbing or edema, No new Rash or bruise  Data Review   CBC w Diff: Lab Results  Component Value Date   WBC 8.6 01/07/2017   HGB 10.1 (L) 01/07/2017   HGB 10.6 (L) 04/15/2014   HCT 29.6 (L) 01/07/2017   HCT 31.6 (L) 04/15/2014   PLT 193 01/07/2017   PLT 204 04/15/2014   LYMPHOPCT 17 11/04/2016   LYMPHOPCT 26.9 04/15/2014   MONOPCT 11 11/04/2016   MONOPCT 12.4 04/15/2014   EOSPCT 1 11/04/2016   EOSPCT 3.4 04/15/2014   BASOPCT 0 11/04/2016   BASOPCT 0.8 04/15/2014    CMP: Lab Results  Component Value Date   NA 136 01/05/2017   NA 141 04/17/2014   K 3.9 01/05/2017   K 5.0 04/17/2014   CL 106 01/05/2017   CL 111 (H) 04/17/2014   CO2 21 (L) 01/05/2017   CO2 23 04/17/2014   BUN 35 (H) 01/05/2017    BUN 33 (H) 04/17/2014   CREATININE 2.57 (  H) 01/05/2017   CREATININE 2.17 (H) 04/17/2014   PROT 7.4 11/04/2016   PROT 7.9 04/14/2014   ALBUMIN 3.8 11/04/2016   ALBUMIN 3.3 (L) 04/16/2014   BILITOT 0.4 11/04/2016   BILITOT 0.3 04/14/2014   ALKPHOS 54 11/04/2016   ALKPHOS 96 04/14/2014   AST 17 11/04/2016   AST 25 04/14/2014   ALT 13 (L) 11/04/2016   ALT 27 04/14/2014  .   Total Time in preparing paper work, data evaluation and todays exam - 35 minutes  Endiya Klahr M.D on 01/07/2017 at 2:40 PM    Note: This dictation was prepared with Dragon dictation along with smaller phrase technology. Any transcriptional errors that result from this process are unintentional.

## 2017-01-07 NOTE — Care Management Important Message (Signed)
Important Message  Patient Details  Name: Phillip Osborne MRN: [id number] Date of Birth: November 28, 1931   Medicare Important Message Given:  Yes  Son signed notice.   Eber Hong, RN 01/07/2017, 3:06 PM

## 2017-01-07 NOTE — Clinical Social Work Note (Signed)
CSW met with patient and his son who was at bedside.  Patient was hesitant but has agreed to SNF for short term rehab.  Patient was given bed offers, and he chose [facility], CSW contacted [facility] and they can accept patient today if he is medically ready for discharge and orders have been received.   R. , MSW, LCSWA [phone]  01/07/2017 2:37 PM  

## 2017-01-07 NOTE — Clinical Social Work Placement (Signed)
   CLINICAL SOCIAL WORK PLACEMENT  NOTE  Date:  01/07/2017  Patient Details  Name: Phillip Osborne MRN: [id number] Date of Birth: Jun 05, 1932  Clinical Social Work is seeking post-discharge placement for this patient at the Skilled  Nursing Facility level of care (*CSW will initial, date and re-position this form in  chart as items are completed):  Yes   Patient/family provided with [facility] Clinical Social Work Department's list of facilities offering this level of care within the geographic area requested by the patient (or if unable, by the patient's family).  Yes   Patient/family informed of their freedom to choose among providers that offer the needed level of care, that participate in Medicare, Medicaid or managed care program needed by the patient, have an available bed and are willing to accept the patient.  Yes   Patient/family informed of [facility]'s ownership interest in [facility] and [facility], as well as of the fact that they are under no obligation to receive care at these facilities.  PASRR submitted to EDS on 01/07/17     PASRR number received on 01/07/17     Existing PASRR number confirmed on       FL2 transmitted to all facilities in geographic area requested by pt/family on 01/05/17     FL2 transmitted to all facilities within larger geographic area on       Patient informed that his/her managed care company has contracts with or will negotiate with certain facilities, including the following:        Yes   Patient/family informed of bed offers received.  Patient chooses bed at [facility]     Physician recommends and patient chooses bed at      Patient to be transferred to [facility] on 01/07/17.  Patient to be transferred to facility by Patient's son will transport     Patient family notified on 01/07/17 of transfer.  Name of family member notified:  Dia Sitter, patient's significant other.     PHYSICIAN        Additional Comment:    _______________________________________________ Darleene Cleaver, LCSWA 01/07/2017, 3:13 PM

## 2017-02-03 ENCOUNTER — Ambulatory Visit: Payer: Medicare Other | Attending: Family Medicine

## 2017-04-21 NOTE — Progress Notes (Signed)
04/22/2017 10:19 AM   Phillip Osborne 1932/07/02 [id number]  Referring provider: Marisue Ivan, MD [phone] [facility] [address] [facility] [place], [place] [address]  Chief Complaint  Patient presents with  . Abdominal Pain    Patient thinks he has a kidney stone  . Back Pain    HPI: 83 yo AAM with a history of nephrolithiasis who presents today complaining of pain which may be due to a stone.  Patient was last seen by [place] in 05/2016 after undergoing ESWL for a left UVJ stone.    CT Renal stone study performed in 11/2016 noted In the upper pole collecting system of the right kidney there is a 3 mm nonobstructive calculus. No additional calculi are noted within the left renal collecting system, or along the course of either ureter. In the urinary bladder there is a 4 mm calculus lying dependently on the left side. Multiple well-defined low-attenuation lesions are noted in the kidneys bilaterally, incompletely characterized on today's noncontrast CT examination, but previously characterized as simple cysts. 1 additional lesion in the interpolar region of the left kidney measures 2.5 cm in diameter and is low-intermediate attenuation (24 HU), unchanged, presumably a mildly proteinaceous cyst. Unenhanced appearance of the adrenal glands and urinary bladder is otherwise unremarkable.  Today, he is experiencing frequency, dysuria, nocturia, intermittency, hesitancy and straining.to urinate.  He has also been experiencing back pain that radiates to his penis.  This has been occurring for the last two weeks.  He states the pain is 10/10.  He describes it as sharp.  He is also constipated.  He denies fevers, chills, nausea and vomiting.  His UA was unremarkable.  His PVR 84 mL.     PMH: Past Medical History:  Diagnosis Date  . Diabetes mellitus without complication (HCC)   . Gout   . Hypertension   . Kidney stone   . Seizures (HCC)   . Thyroid disease     Surgical  History: Past Surgical History:  Procedure Laterality Date  . EXTRACORPOREAL SHOCK WAVE LITHOTRIPSY Left 02/22/2016   Procedure: EXTRACORPOREAL SHOCK WAVE LITHOTRIPSY (ESWL);  Surgeon: Hildred Laser, MD;  Location: [facility] ORS;  Service: Urology;  Laterality: Left;  . HERNIA REPAIR  70's    Home Medications:  Allergies as of 04/22/2017      Reactions   Enalapril Swelling   Face swelling.   Lisinopril Swelling      Medication List       Accurate as of 04/22/17 10:19 AM. Always use your most recent med list.          ACCU-CHEK AVIVA test strip Generic drug:  glucose blood 1 each by Other route 3 (three) times daily. (Fasting and 2 hours after largest meal)   atorvastatin 20 MG tablet Commonly known as:  LIPITOR Take by mouth.   divalproex 125 MG capsule Commonly known as:  DEPAKOTE SPRINKLE Take 2 capsules (250 mg total) by mouth every 12 (twelve) hours.   donepezil 5 MG tablet Commonly known as:  ARICEPT Take 5 mg by mouth at bedtime.   febuxostat 40 MG tablet Commonly known as:  ULORIC Take 40 mg by mouth daily.   ferrous sulfate 325 (65 FE) MG tablet Take 325 mg by mouth daily.   fludrocortisone 0.1 MG tablet Commonly known as:  FLORINEF Take 1 tablet (0.1 mg total) by mouth daily.   folic acid 1 MG tablet Commonly known as:  FOLVITE Take 1 mg by mouth daily.  furosemide 20 MG tablet Commonly known as:  LASIX Take 20 mg by mouth every morning.   gabapentin 100 MG capsule Commonly known as:  NEURONTIN Take 200 mg by mouth 2 (two) times daily.   insulin glargine 100 UNIT/ML injection Commonly known as:  LANTUS Inject 45 Units into the skin daily.   insulin lispro 100 UNIT/ML injection Commonly known as:  HUMALOG Inject 5 Units into the skin 3 (three) times daily before meals. (Hold if CBG is less than 100)   lamoTRIgine 25 MG tablet Commonly known as:  LAMICTAL Take 2 tablets (50 mg total) by mouth at bedtime.   lamoTRIgine 25 MG  tablet Commonly known as:  LAMICTAL Take 1 tablet (25 mg total) by mouth daily.   levETIRAcetam 500 MG tablet Commonly known as:  KEPPRA Take by mouth.   levothyroxine 75 MCG tablet Commonly known as:  SYNTHROID, LEVOTHROID Take 75 mcg by mouth daily.   patiromer 8.4 g packet Commonly known as:  VELTASSA Take 1 packet (8.4 g total) by mouth daily.   sodium bicarbonate 650 MG tablet Take 1,300 mg by mouth 3 (three) times daily.   tamsulosin 0.4 MG Caps capsule Commonly known as:  FLOMAX Take 0.4 mg by mouth.   Valproic Acid 250 MG Cpdr Take 250 mg by mouth 2 (two) times daily.       Allergies:  Allergies  Allergen Reactions  . Enalapril Swelling    Face swelling.  . Lisinopril Swelling    Family History: Family History  Problem Relation Age of Onset  . Cancer Mother   . Diabetes Sister   . Kidney disease Neg Hx   . Prostate cancer Neg Hx   . Kidney cancer Neg Hx   . Bladder Cancer Neg Hx     Social History:  reports that he has never smoked. He has never used smokeless tobacco. He reports that he does not drink alcohol or use drugs.  ROS: UROLOGY Frequent Urination?: Yes Hard to postpone urination?: Yes Burning/pain with urination?: Yes Get up at night to urinate?: Yes Leakage of urine?: No Urine stream starts and stops?: Yes Trouble starting stream?: Yes Do you have to strain to urinate?: Yes Blood in urine?: No Urinary tract infection?: No Sexually transmitted disease?: No Injury to kidneys or bladder?: No Painful intercourse?: No Weak stream?: No Erection problems?: No Penile pain?: Yes  Gastrointestinal Nausea?: No Vomiting?: No Indigestion/heartburn?: No Diarrhea?: No Constipation?: Yes  Constitutional Fever: No Night sweats?: No Weight loss?: No Fatigue?: No  Skin Skin rash/lesions?: No Itching?: No  Eyes Blurred vision?: No Double vision?: No  Ears/Nose/Throat Sore throat?: No Sinus problems?:  No  Hematologic/Lymphatic Swollen glands?: No Easy bruising?: No  Cardiovascular Leg swelling?: No Chest pain?: No  Respiratory Cough?: No Shortness of breath?: No  Endocrine Excessive thirst?: No  Musculoskeletal Back pain?: No Joint pain?: No  Neurological Headaches?: No Dizziness?: No  Psychologic Depression?: No Anxiety?: No  Physical Exam: BP 120/66   Pulse 91   Ht 5\' 5"  (1.651 m)   Wt 166 lb 11.2 oz (75.6 kg)   BMI 27.74 kg/m   Constitutional: Well nourished. Alert and oriented, No acute distress. HEENT: [place] AT, moist mucus membranes. Trachea midline, no masses. Cardiovascular: No clubbing, cyanosis, or edema. Respiratory: Normal respiratory effort, no increased work of breathing. GI: Abdomen is soft, non tender, non distended, no abdominal masses. Liver and spleen not palpable.  No hernias appreciated.  Stool sample for occult testing is not indicated.  GU: No CVA tenderness.  No bladder fullness or masses.  Patient with uncircumcised phallus. Foreskin easily retracted  Urethral meatus is patent.  No penile discharge. No penile lesions or rashes. Vitiligo present.  Scrotum without lesions, cysts, rashes and/or edema.  Testicles are located scrotally bilaterally. No masses are appreciated in the testicles. Left and right epididymis are normal. Rectal: Patient with  normal sphincter tone. Anus and perineum without scarring or rashes. No rectal masses are appreciated. Prostate is approximately 70  grams, no nodules are appreciated. Seminal vesicles are normal. Skin: No rashes, bruises or suspicious lesions. Lymph: No cervical or inguinal adenopathy. Neurologic: Grossly intact, no focal deficits, moving all 4 extremities. Psychiatric: Normal mood and affect.  Laboratory Data: Lab Results  Component Value Date   WBC 8.6 01/07/2017   HGB 10.1 (L) 01/07/2017   HCT 29.6 (L) 01/07/2017   MCV 86.8 01/07/2017   PLT 193 01/07/2017    Lab Results  Component  Value Date   CREATININE 2.57 (H) 01/05/2017    Lab Results  Component Value Date   HGBA1C 8.4 (H) 01/04/2017       Component Value Date/Time   CHOL 121 05/10/2013 0415   HDL 60 05/10/2013 0415   VLDL 24 05/10/2013 0415   LDLCALC 37 05/10/2013 0415    Lab Results  Component Value Date   AST 17 11/04/2016   Lab Results  Component Value Date   ALT 13 (L) 11/04/2016    Urinalysis Unremarkable.  See EPIC.   I have independently reviewed the labs.  Pertinent Imaging: CLINICAL DATA:  Right groin and flank pain for 1 week. History kidney stones. Dysuria. Prior hernia repair.  EXAM: CT ABDOMEN AND PELVIS WITHOUT CONTRAST  TECHNIQUE: Multidetector CT imaging of the abdomen and pelvis was performed following the standard protocol without IV contrast.  COMPARISON:  02/13/2016 CT.  Renal ultrasound of 05/27/2016.  FINDINGS: Lower chest: Emphysema at the lung bases. Left base opacity is new since the prior exam, including on image 14/series 4. Mild cardiomegaly. Mild bilateral gynecomastia.  Hepatobiliary: Normal liver. Normal gallbladder, without biliary ductal dilatation.  Pancreas: Normal, without mass or ductal dilatation.  Spleen: Normal in size, without focal abnormality.  Adrenals/Urinary Tract: Normal adrenal glands. Mild renal cortical thinning bilaterally. Lower pole 2.7 cm right renal cyst. A lower pole 14 mm left renal low-density lesion is likely a cyst.  Posterior interpolar left renal lesion measures 2.8 cm and minimally greater than fluid density. This is present back to 04/27/13, where it measured 2.2 cm.  Punctate right renal collecting system calculus, without hydronephrosis or hydroureter. Moderately thickened bladder wall with mild pericystic edema. Left-sided bladder base stone measures 5 mm.  Stomach/Bowel: Gastric diverticulum again identified posteriorly. Otherwise normal stomach. Normal colon, appendix, and terminal ileum.  Normal small bowel.  Vascular/Lymphatic: Aortic and branch vessel atherosclerosis. No abdominopelvic adenopathy.  Reproductive: Mild prostatomegaly.  Other: No significant free fluid.  Musculoskeletal: No acute osseous abnormality.  IMPRESSION: 1. Right nephrolithiasis, without obstructive uropathy. 2. Left-sided bladder stone. 3. Prostatomegaly. Concurrent bladder wall thickening and mild pericystic edema. Findings are grossly similar to 02/13/2016. The bladder findings could be related to bladder outlet obstruction or cystitis. 4. Bilateral renal low-density lesions are likely cysts. An interpolar left renal lesion measures slightly greater than fluid density, and has enlarged minimally since 04/27/2013. Favor a complex cyst. Technically indeterminate. Consider definitive characterization with pre and post contrast abdominal MRI. As an interpolar left renal lesion in this region appeared simple on  the prior ultrasound (favored to represent the same lesion) ultrasound surveillance at 6-12 months would be a reasonable strategy. 5. New left base opacity since 02/13/2016. Favored to represent an area of infection or aspiration. Correlate with chest symptoms. Consider chest CT followup at 3- 6 months to confirm stability or resolution.   Electronically Signed   By: Jeronimo GreavesKyle  Talbot M.D.   On: 11/04/2016 17:23  CLINICAL DATA:  83 year old male with history of right-sided groin and flank pain for the past couple weeks. History of urinary tract calculi.  EXAM: CT ABDOMEN AND PELVIS WITHOUT CONTRAST  TECHNIQUE: Multidetector CT imaging of the abdomen and pelvis was performed following the standard protocol without IV contrast.  COMPARISON:  CT the abdomen and pelvis 11/04/2016.  FINDINGS: Lower chest: Previously noted focus of airspace consolidation in the left lower lobe has resolved. There are peripheral areas of ground-glass attenuation and subpleural  reticulation concerning for developing interstitial lung disease.  Hepatobiliary: No definite cystic or solid hepatic lesions are identified on today's noncontrast CT examination. Unenhanced appearance of the gallbladder is normal.  Pancreas: No pancreatic mass or peripancreatic inflammatory changes are noted on today's noncontrast CT examination.  Spleen: Unremarkable.  Adrenals/Urinary Tract: In the upper pole collecting system of the right kidney there is a 3 mm nonobstructive calculus. No additional calculi are noted within the left renal collecting system, or along the course of either ureter. In the urinary bladder there is a 4 mm calculus lying dependently on the left side. Multiple well-defined low-attenuation lesions are noted in the kidneys bilaterally, incompletely characterized on today's noncontrast CT examination, but previously characterized as simple cysts. 1 additional lesion in the interpolar region of the left kidney measures 2.5 cm in diameter and is low-intermediate attenuation (24 HU), unchanged, presumably a mildly proteinaceous cyst. Unenhanced appearance of the adrenal glands and urinary bladder is otherwise unremarkable.  Stomach/Bowel: Small gastric diverticulum extending from the cardia of the stomach posteriorly. Remainder of the stomach is otherwise unremarkable in appearance. There is no pathologic dilatation of small bowel or colon. A few scattered colonic diverticulae are noted, without surrounding inflammatory changes to suggest an acute diverticulitis at this time. Normal appendix.  Vascular/Lymphatic: Atherosclerotic calcifications throughout the abdominal aorta, without evidence of definite aneurysm in the abdominal or pelvic vasculature. No lymphadenopathy noted in the abdomen or pelvis.  Reproductive: Prostate gland and seminal vesicles are unremarkable in appearance.  Other: No significant volume of ascites.  No  pneumoperitoneum.  Musculoskeletal: There are no aggressive appearing lytic or blastic lesions noted in the visualized portions of the skeleton.  IMPRESSION: 1. 3 mm nonobstructive calculus in the upper pole collecting system of the right kidney and 4 mm nonobstructive calculus in the urinary bladder appears similar to the prior study. No ureteral stones or findings of urinary tract obstruction are noted at this time. 2. Mild colonic diverticulosis without evidence of acute diverticulitis at this time. 3. Aortic atherosclerosis. 4. Findings in the visualize lung bases suggests underlying interstitial lung disease. This could be better evaluated with followup nonemergent high-resolution chest CT in the next 6-12 months to assess for temporal changes in the appearance of the lung parenchyma. 5. Previously noted small focus of airspace consolidation in the left lower lobe has resolved, indicative of an infectious or inflammatory etiology on the prior examination.   Electronically Signed   By: Trudie Reedaniel  Entrikin M.D.   On: 11/24/2016 10:29  I have independently reviewed the films.    Assessment & Plan:  1. Dysuria  - UA is unremarkable  - May be due to ureteral stone - we'll obtain CT renal stone study for further evaluation  - Advised to contact our office or seek treatment in the ED if becomes febrile or pain/ vomiting are difficult control in order to arrange for emergent/urgent intervention  2. Flank pain  - may be due to stones-CT scan is pending  3. Constipation  - cause of pain vs result from stone - CT scan is pending  4. Renal lesions  - most likely cysts - last CT 6 months ago will compare with new CT once available  5. Left renal lesion  - 2.5 cm presumed to be a proteinaceous cyst  - See above    Return for CT report.  These notes generated with voice recognition software. I apologize for typographical errors.  Michiel Cowboy, PA-C  [facility] [address] [place], [place] [address] [phone]

## 2017-04-22 ENCOUNTER — Encounter: Payer: Self-pay | Admitting: Urology

## 2017-04-22 ENCOUNTER — Ambulatory Visit (INDEPENDENT_AMBULATORY_CARE_PROVIDER_SITE_OTHER): Payer: Medicare Other | Admitting: Urology

## 2017-04-22 VITALS — BP 120/66 | HR 91 | Ht 65.0 in | Wt 166.7 lb

## 2017-04-22 DIAGNOSIS — K5909 Other constipation: Secondary | ICD-10-CM

## 2017-04-22 DIAGNOSIS — R109 Unspecified abdominal pain: Secondary | ICD-10-CM

## 2017-04-22 DIAGNOSIS — R300 Dysuria: Secondary | ICD-10-CM | POA: Diagnosis not present

## 2017-04-22 DIAGNOSIS — N289 Disorder of kidney and ureter, unspecified: Secondary | ICD-10-CM

## 2017-04-22 LAB — URINALYSIS, COMPLETE
BILIRUBIN UA: NEGATIVE
Glucose, UA: NEGATIVE
KETONES UA: NEGATIVE
NITRITE UA: NEGATIVE
Protein, UA: NEGATIVE
RBC UA: NEGATIVE
SPEC GRAV UA: 1.02 (ref 1.005–1.030)
UUROB: 0.2 mg/dL (ref 0.2–1.0)
pH, UA: 7 (ref 5.0–7.5)

## 2017-04-22 LAB — MICROSCOPIC EXAMINATION: RBC MICROSCOPIC, UA: NONE SEEN /HPF (ref 0–?)

## 2017-04-22 LAB — BLADDER SCAN AMB NON-IMAGING: Scan Result: 84

## 2017-05-02 ENCOUNTER — Ambulatory Visit: Payer: Medicare Other

## 2017-05-02 ENCOUNTER — Ambulatory Visit
Admission: RE | Admit: 2017-05-02 | Discharge: 2017-05-02 | Disposition: A | Discharge status: Home / Self Care | Payer: Medicare Other | Source: Ambulatory Visit | Attending: Family Medicine | Admitting: Family Medicine

## 2017-05-02 DIAGNOSIS — Z95828 Presence of other vascular implants and grafts: Secondary | ICD-10-CM

## 2017-05-02 DIAGNOSIS — A499 Bacterial infection, unspecified: Secondary | ICD-10-CM | POA: Diagnosis present

## 2017-05-02 DIAGNOSIS — Z22322 Carrier or suspected carrier of Methicillin resistant Staphylococcus aureus: Secondary | ICD-10-CM | POA: Diagnosis not present

## 2017-05-02 DIAGNOSIS — N390 Urinary tract infection, site not specified: Secondary | ICD-10-CM | POA: Diagnosis present

## 2017-05-02 LAB — GLUCOSE, CAPILLARY
Glucose-Capillary: 188 mg/dL — ABNORMAL HIGH (ref 65–99)
Glucose-Capillary: 103 mg/dL — ABNORMAL HIGH (ref 65–99)

## 2017-05-02 MED ORDER — SODIUM CHLORIDE 0.9 % IV SOLN
1.0000 g | INTRAVENOUS | Status: AC
  Administered 2017-05-02: 1 g via INTRAVENOUS
  Filled 2017-05-02: qty 1

## 2017-05-02 MED ORDER — SODIUM CHLORIDE FLUSH 0.9 % IV SOLN
INTRAVENOUS | Status: AC
  Administered 2017-05-02: 10 mL
  Administered 2017-05-02: 15:00:00
  Filled 2017-05-02: qty 30

## 2017-05-02 NOTE — OR Nursing (Signed)
2 reps completed PICC insertion after several attempts. Request for portable stat CXR initiated. Xray here now.

## 2017-05-02 NOTE — OR Nursing (Signed)
[facility] representative had concerns over the creatnine level for this patient . Telephone call to Dr. Marcelle OverlieKollorus' office was made [facility]  There is no discussion of this patient needing dialysis at this time.  The representative for PICC placement was informed and he is speaking with patient over site placement.

## 2017-05-02 NOTE — OR Nursing (Signed)
CXR confirmed proper placement of PICC in upper left arm. Tegaderm/dressing covering site. Explained antibiotic regime once home. Home care nurse to provide Invanz IV tomorrow. Patient and daughter confirm understanding of plans.

## 2017-05-05 ENCOUNTER — Encounter: Payer: Self-pay | Admitting: *Deleted

## 2017-05-05 ENCOUNTER — Inpatient Hospital Stay
Admission: EM | Admit: 2017-05-05 | Discharge: 2017-05-10 | DRG: 854 | Disposition: A | Discharge status: Home Health | Payer: Medicare Other | Attending: Internal Medicine | Admitting: Internal Medicine

## 2017-05-05 DIAGNOSIS — Z888 Allergy status to other drugs, medicaments and biological substances status: Secondary | ICD-10-CM

## 2017-05-05 DIAGNOSIS — E1122 Type 2 diabetes mellitus with diabetic chronic kidney disease: Secondary | ICD-10-CM | POA: Diagnosis present

## 2017-05-05 DIAGNOSIS — N184 Chronic kidney disease, stage 4 (severe): Secondary | ICD-10-CM | POA: Diagnosis present

## 2017-05-05 DIAGNOSIS — N390 Urinary tract infection, site not specified: Secondary | ICD-10-CM

## 2017-05-05 DIAGNOSIS — Z1612 Extended spectrum beta lactamase (ESBL) resistance: Secondary | ICD-10-CM | POA: Diagnosis present

## 2017-05-05 DIAGNOSIS — R300 Dysuria: Secondary | ICD-10-CM

## 2017-05-05 DIAGNOSIS — Z87442 Personal history of urinary calculi: Secondary | ICD-10-CM

## 2017-05-05 DIAGNOSIS — Z95828 Presence of other vascular implants and grafts: Secondary | ICD-10-CM

## 2017-05-05 DIAGNOSIS — R509 Fever, unspecified: Secondary | ICD-10-CM

## 2017-05-05 DIAGNOSIS — G40909 Epilepsy, unspecified, not intractable, without status epilepticus: Secondary | ICD-10-CM | POA: Diagnosis present

## 2017-05-05 DIAGNOSIS — G8929 Other chronic pain: Secondary | ICD-10-CM | POA: Diagnosis present

## 2017-05-05 DIAGNOSIS — R531 Weakness: Secondary | ICD-10-CM

## 2017-05-05 DIAGNOSIS — Z794 Long term (current) use of insulin: Secondary | ICD-10-CM

## 2017-05-05 DIAGNOSIS — N412 Abscess of prostate: Secondary | ICD-10-CM

## 2017-05-05 DIAGNOSIS — R05 Cough: Secondary | ICD-10-CM

## 2017-05-05 DIAGNOSIS — I129 Hypertensive chronic kidney disease with stage 1 through stage 4 chronic kidney disease, or unspecified chronic kidney disease: Secondary | ICD-10-CM | POA: Diagnosis present

## 2017-05-05 DIAGNOSIS — R059 Cough, unspecified: Secondary | ICD-10-CM

## 2017-05-05 DIAGNOSIS — E1140 Type 2 diabetes mellitus with diabetic neuropathy, unspecified: Secondary | ICD-10-CM | POA: Diagnosis present

## 2017-05-05 DIAGNOSIS — B9620 Unspecified Escherichia coli [E. coli] as the cause of diseases classified elsewhere: Secondary | ICD-10-CM | POA: Diagnosis present

## 2017-05-05 DIAGNOSIS — Z833 Family history of diabetes mellitus: Secondary | ICD-10-CM

## 2017-05-05 DIAGNOSIS — E785 Hyperlipidemia, unspecified: Secondary | ICD-10-CM | POA: Diagnosis present

## 2017-05-05 DIAGNOSIS — E039 Hypothyroidism, unspecified: Secondary | ICD-10-CM | POA: Diagnosis present

## 2017-05-05 DIAGNOSIS — A419 Sepsis, unspecified organism: Secondary | ICD-10-CM | POA: Diagnosis not present

## 2017-05-05 DIAGNOSIS — M109 Gout, unspecified: Secondary | ICD-10-CM | POA: Diagnosis present

## 2017-05-05 DIAGNOSIS — Z809 Family history of malignant neoplasm, unspecified: Secondary | ICD-10-CM

## 2017-05-05 DIAGNOSIS — Z79899 Other long term (current) drug therapy: Secondary | ICD-10-CM

## 2017-05-05 DIAGNOSIS — N202 Calculus of kidney with calculus of ureter: Secondary | ICD-10-CM | POA: Diagnosis present

## 2017-05-05 LAB — CBC
HCT: 28.3 % — ABNORMAL LOW (ref 40.0–52.0)
Hemoglobin: 9.5 g/dL — ABNORMAL LOW (ref 13.0–18.0)
MCH: 28.7 pg (ref 26.0–34.0)
MCHC: 33.6 g/dL (ref 32.0–36.0)
MCV: 85.3 fL (ref 80.0–100.0)
Platelets: 260 10*3/uL (ref 150–440)
RBC: 3.32 MIL/uL — ABNORMAL LOW (ref 4.40–5.90)
RDW: 14.3 % (ref 11.5–14.5)
WBC: 8.4 10*3/uL (ref 3.8–10.6)

## 2017-05-05 LAB — BASIC METABOLIC PANEL
Anion gap: 9 (ref 5–15)
BUN: 31 mg/dL — ABNORMAL HIGH (ref 6–20)
CO2: 26 mmol/L (ref 22–32)
Calcium: 9.2 mg/dL (ref 8.9–10.3)
Chloride: 100 mmol/L — ABNORMAL LOW (ref 101–111)
Creatinine, Ser: 2.46 mg/dL — ABNORMAL HIGH (ref 0.61–1.24)
GFR calc Af Amer: 27 mL/min — ABNORMAL LOW (ref >60)
GFR calc non Af Amer: 23 mL/min — ABNORMAL LOW (ref >60)
Glucose, Bld: 127 mg/dL — ABNORMAL HIGH (ref 65–99)
Potassium: 4.5 mmol/L (ref 3.5–5.1)
Sodium: 135 mmol/L (ref 135–145)

## 2017-05-05 LAB — LACTIC ACID, PLASMA: Lactic Acid, Venous: 0.6 mmol/L (ref 0.5–1.9)

## 2017-05-05 MED ORDER — SODIUM CHLORIDE 0.9 % IV BOLUS (SEPSIS)
1000.0000 mL | Freq: Once | INTRAVENOUS | Status: AC
  Administered 2017-05-06: 1000 mL via INTRAVENOUS

## 2017-05-05 NOTE — ED Triage Notes (Signed)
Pt presents w/ new onset weakness, noted after dinner when he could not walk. Pt is not oreients to time and unable to provide recent hx of why he is in hospital tonight. Pt has a PICC in place and is receiving INVANZ abx @ home for UTI. Pt also c/o increased dysuria starting today. Pt is warm to touch.

## 2017-05-06 ENCOUNTER — Observation Stay: Payer: Medicare Other

## 2017-05-06 ENCOUNTER — Emergency Department: Payer: Medicare Other

## 2017-05-06 DIAGNOSIS — R531 Weakness: Secondary | ICD-10-CM

## 2017-05-06 LAB — GLUCOSE, CAPILLARY
Glucose-Capillary: 85 mg/dL (ref 65–99)
Glucose-Capillary: 98 mg/dL (ref 65–99)
Glucose-Capillary: 130 mg/dL — ABNORMAL HIGH (ref 65–99)
Glucose-Capillary: 137 mg/dL — ABNORMAL HIGH (ref 65–99)
Glucose-Capillary: 148 mg/dL — ABNORMAL HIGH (ref 65–99)

## 2017-05-06 LAB — URINALYSIS, COMPLETE (UACMP) WITH MICROSCOPIC
BACTERIA UA: NONE SEEN
BILIRUBIN URINE: NEGATIVE
GLUCOSE, UA: NEGATIVE mg/dL
Hgb urine dipstick: NEGATIVE
KETONES UR: NEGATIVE mg/dL
LEUKOCYTES UA: NEGATIVE
NITRITE: NEGATIVE
PH: 7 (ref 5.0–8.0)
Protein, ur: NEGATIVE mg/dL
RBC / HPF: NONE SEEN RBC/hpf (ref 0–5)
SPECIFIC GRAVITY, URINE: 1.003 — AB (ref 1.005–1.030)

## 2017-05-06 LAB — HEPATIC FUNCTION PANEL
ALBUMIN: 3.6 g/dL (ref 3.5–5.0)
ALT: 12 U/L — ABNORMAL LOW (ref 17–63)
AST: 16 U/L (ref 15–41)
Alkaline Phosphatase: 67 U/L (ref 38–126)
BILIRUBIN TOTAL: 0.6 mg/dL (ref 0.3–1.2)
Total Protein: 7.1 g/dL (ref 6.5–8.1)

## 2017-05-06 LAB — TSH: TSH: 0.988 u[IU]/mL (ref 0.350–4.500)

## 2017-05-06 MED ORDER — HYDROCODONE-ACETAMINOPHEN 5-325 MG PO TABS
1.0000 | ORAL_TABLET | Freq: Four times a day (QID) | ORAL | Status: DC | PRN
Start: 2017-05-06 — End: 2017-05-11
  Administered 2017-05-08 – 2017-05-09 (×3): 1 via ORAL
  Filled 2017-05-06 (×3): qty 1

## 2017-05-06 MED ORDER — FERROUS SULFATE 325 (65 FE) MG PO TABS
325.0000 mg | ORAL_TABLET | Freq: Every day | ORAL | Status: DC
  Administered 2017-05-06 – 2017-05-10 (×4): 325 mg via ORAL
  Filled 2017-05-06 (×4): qty 1

## 2017-05-06 MED ORDER — SODIUM CHLORIDE 0.9 % IV SOLN
500.0000 mg | INTRAVENOUS | Status: DC
  Administered 2017-05-06: 0.5 g via INTRAVENOUS
  Filled 2017-05-06: qty 0.5

## 2017-05-06 MED ORDER — INSULIN GLARGINE 100 UNIT/ML ~~LOC~~ SOLN
30.0000 [IU] | Freq: Every day | SUBCUTANEOUS | Status: DC
  Administered 2017-05-06 – 2017-05-10 (×4): 30 [IU] via SUBCUTANEOUS
  Filled 2017-05-06 (×6): qty 0.3

## 2017-05-06 MED ORDER — VANCOMYCIN HCL IN DEXTROSE 1-5 GM/200ML-% IV SOLN
1000.0000 mg | INTRAVENOUS | Status: DC
  Administered 2017-05-06: 1000 mg via INTRAVENOUS
  Filled 2017-05-06 (×2): qty 200

## 2017-05-06 MED ORDER — SODIUM CHLORIDE 0.9 % IV SOLN
1.0000 g | INTRAVENOUS | Status: DC
  Filled 2017-05-06: qty 1

## 2017-05-06 MED ORDER — MORPHINE SULFATE (PF) 2 MG/ML IV SOLN: 1.0000 mg | Freq: Four times a day (QID) | INTRAVENOUS | Status: DC | PRN

## 2017-05-06 MED ORDER — LAMOTRIGINE 25 MG PO TABS
50.0000 mg | ORAL_TABLET | Freq: Every day | ORAL | Status: DC
  Administered 2017-05-07 – 2017-05-10 (×4): 50 mg via ORAL
  Filled 2017-05-06 (×4): qty 2

## 2017-05-06 MED ORDER — LAMOTRIGINE 25 MG PO TABS
25.0000 mg | ORAL_TABLET | Freq: Every day | ORAL | Status: DC
  Administered 2017-05-06: 25 mg via ORAL
  Filled 2017-05-06 (×2): qty 1

## 2017-05-06 MED ORDER — SODIUM CHLORIDE 0.9 % IV SOLN: INTRAVENOUS | Status: AC

## 2017-05-06 MED ORDER — DONEPEZIL HCL 5 MG PO TABS
5.0000 mg | ORAL_TABLET | Freq: Every day | ORAL | Status: DC
  Administered 2017-05-07 – 2017-05-09 (×3): 5 mg via ORAL
  Filled 2017-05-06 (×6): qty 1

## 2017-05-06 MED ORDER — DOCUSATE SODIUM 100 MG PO CAPS
100.0000 mg | ORAL_CAPSULE | Freq: Two times a day (BID) | ORAL | Status: DC
  Administered 2017-05-06 – 2017-05-10 (×7): 100 mg via ORAL
  Filled 2017-05-06 (×7): qty 1

## 2017-05-06 MED ORDER — LEVOTHYROXINE SODIUM 50 MCG PO TABS
75.0000 ug | ORAL_TABLET | Freq: Every day | ORAL | Status: DC
  Administered 2017-05-06 – 2017-05-10 (×4): 75 ug via ORAL
  Filled 2017-05-06: qty 1
  Filled 2017-05-06 (×3): qty 2

## 2017-05-06 MED ORDER — ACETAMINOPHEN 650 MG RE SUPP: 650.0000 mg | Freq: Four times a day (QID) | RECTAL | Status: DC | PRN

## 2017-05-06 MED ORDER — INSULIN ASPART 100 UNIT/ML ~~LOC~~ SOLN
0.0000 [IU] | Freq: Three times a day (TID) | SUBCUTANEOUS | Status: DC
  Administered 2017-05-06: 1 [IU] via SUBCUTANEOUS
  Administered 2017-05-08: 7 [IU] via SUBCUTANEOUS
  Administered 2017-05-08: 5 [IU] via SUBCUTANEOUS
  Administered 2017-05-08 – 2017-05-09 (×2): 2 [IU] via SUBCUTANEOUS
  Administered 2017-05-09: 3 [IU] via SUBCUTANEOUS
  Administered 2017-05-09: 1 [IU] via SUBCUTANEOUS
  Administered 2017-05-10: 2 [IU] via SUBCUTANEOUS
  Filled 2017-05-06 (×8): qty 1

## 2017-05-06 MED ORDER — PATIROMER SORBITEX CALCIUM 8.4 G PO PACK
8.4000 g | PACK | Freq: Every day | ORAL | Status: DC
  Administered 2017-05-06 – 2017-05-10 (×4): 8.4 g via ORAL
  Filled 2017-05-06 (×5): qty 4

## 2017-05-06 MED ORDER — FEBUXOSTAT 40 MG PO TABS
40.0000 mg | ORAL_TABLET | Freq: Every day | ORAL | Status: DC
Start: 2017-05-06 — End: 2017-05-11
  Administered 2017-05-06 – 2017-05-10 (×4): 40 mg via ORAL
  Filled 2017-05-06 (×5): qty 1

## 2017-05-06 MED ORDER — ONDANSETRON HCL 4 MG PO TABS: 4.0000 mg | ORAL_TABLET | Freq: Four times a day (QID) | ORAL | Status: DC | PRN

## 2017-05-06 MED ORDER — ATORVASTATIN CALCIUM 20 MG PO TABS
20.0000 mg | ORAL_TABLET | Freq: Every day | ORAL | Status: DC
  Administered 2017-05-07 – 2017-05-09 (×3): 20 mg via ORAL
  Filled 2017-05-06 (×3): qty 1

## 2017-05-06 MED ORDER — LEVETIRACETAM 500 MG PO TABS
500.0000 mg | ORAL_TABLET | Freq: Two times a day (BID) | ORAL | Status: DC
  Administered 2017-05-06 – 2017-05-10 (×8): 500 mg via ORAL
  Filled 2017-05-06 (×10): qty 1

## 2017-05-06 MED ORDER — HEPARIN SODIUM (PORCINE) 5000 UNIT/ML IJ SOLN
5000.0000 [IU] | Freq: Three times a day (TID) | INTRAMUSCULAR | Status: DC
  Administered 2017-05-06 – 2017-05-10 (×11): 5000 [IU] via SUBCUTANEOUS
  Filled 2017-05-06 (×11): qty 1

## 2017-05-06 MED ORDER — GABAPENTIN 300 MG PO CAPS
300.0000 mg | ORAL_CAPSULE | Freq: Three times a day (TID) | ORAL | Status: DC
  Administered 2017-05-06 – 2017-05-10 (×11): 300 mg via ORAL
  Filled 2017-05-06 (×11): qty 1

## 2017-05-06 MED ORDER — ONDANSETRON HCL 4 MG/2ML IJ SOLN
4.0000 mg | Freq: Four times a day (QID) | INTRAMUSCULAR | Status: DC | PRN
  Administered 2017-05-06: 4 mg via INTRAVENOUS
  Filled 2017-05-06: qty 2

## 2017-05-06 MED ORDER — VANCOMYCIN HCL IN DEXTROSE 1-5 GM/200ML-% IV SOLN
1000.0000 mg | Freq: Once | INTRAVENOUS | Status: AC
  Administered 2017-05-06: 1000 mg via INTRAVENOUS
  Filled 2017-05-06 (×2): qty 200

## 2017-05-06 MED ORDER — FOLIC ACID 1 MG PO TABS
1.0000 mg | ORAL_TABLET | Freq: Every day | ORAL | Status: DC
  Administered 2017-05-06 – 2017-05-10 (×4): 1 mg via ORAL
  Filled 2017-05-06 (×4): qty 1

## 2017-05-06 MED ORDER — ACETAMINOPHEN 325 MG PO TABS
650.0000 mg | ORAL_TABLET | Freq: Four times a day (QID) | ORAL | Status: DC | PRN
  Administered 2017-05-06 (×2): 650 mg via ORAL
  Filled 2017-05-06 (×2): qty 2

## 2017-05-06 MED ORDER — MEROPENEM-SODIUM CHLORIDE 500 MG/50ML IV SOLR
500.0000 mg | Freq: Two times a day (BID) | INTRAVENOUS | Status: DC
  Administered 2017-05-06 – 2017-05-08 (×5): 500 mg via INTRAVENOUS
  Filled 2017-05-06 (×7): qty 50

## 2017-05-06 MED ORDER — SODIUM BICARBONATE 650 MG PO TABS
1300.0000 mg | ORAL_TABLET | Freq: Three times a day (TID) | ORAL | Status: DC
  Administered 2017-05-06 – 2017-05-10 (×11): 1300 mg via ORAL
  Filled 2017-05-06 (×11): qty 2

## 2017-05-06 MED ORDER — SODIUM CHLORIDE 0.9 % IV SOLN
500.0000 mg | Freq: Two times a day (BID) | INTRAVENOUS | Status: DC
  Filled 2017-05-06 (×2): qty 0.5

## 2017-05-06 NOTE — Consult Note (Signed)
[facility] Infectious Disease     Reason for Consult: Sepsis    Referring Physician: Gouru, A Date of Admission:  05/05/2017   Active Problems:   Weakness   HPI: Phillip Osborne is a 83 y.o. male admitted with weakness and fevers. He had nml wbc, cr 2.4 (baseline) and neg UA. Had recent ESBL E coli 50-100K on UCX 7/18 but UA with only 4-10 wbc on 7/18 and on 7/6 with o-3 wbc. He was started on IV ertapenem 7/28.  Was to have CT 7/27  But not done due to having picc placed. Was seen by urology recently as otpt. Today had some nv, but no pain. No diarrhea, normal stools per report but some constipation. He lives at home with GF, has some demenita per family in room.   Past Medical History:  Diagnosis Date  . Diabetes mellitus without complication ([place])   . Gout   . Hypertension   . Kidney stone   . Seizures ([place])    last seizure about 1 year ago(2017)  . Thyroid disease    Past Surgical History:  Procedure Laterality Date  . EXTRACORPOREAL SHOCK WAVE LITHOTRIPSY Left 02/22/2016   Procedure: EXTRACORPOREAL SHOCK WAVE LITHOTRIPSY (ESWL);  Surgeon: Nickie Retort, MD;  Location: [facility] ORS;  Service: Urology;  Laterality: Left;  . EYE SURGERY Left    cataract surgery  . HERNIA REPAIR  70's   Social History  Substance Use Topics  . Smoking status: Never Smoker  . Smokeless tobacco: Never Used  . Alcohol use No   Family History  Problem Relation Age of Onset  . Cancer Mother   . Diabetes Sister   . Kidney disease Neg Hx   . Prostate cancer Neg Hx   . Kidney cancer Neg Hx   . Bladder Cancer Neg Hx     Allergies:  Allergies  Allergen Reactions  . Enalapril Swelling    Face swelling.  . Lisinopril Swelling    mouth    Current antibiotics: Antibiotics Given (last 72 hours)    Date/Time Action Medication Dose Rate   05/06/17 0846 New Bag/Given   ertapenem (INVANZ) 0.5 g in sodium chloride 0.9 % 50 mL IVPB 0.5 g 100 mL/hr   05/06/17 1141 New Bag/Given  [med  not available]   vancomycin (VANCOCIN) IVPB 1000 mg/200 mL premix 1,000 mg 200 mL/hr   05/06/17 1300 New Bag/Given   meropenem (MERREM) IVPB SOLR 500 mg 500 mg 100 mL/hr   05/06/17 1900 New Bag/Given   vancomycin (VANCOCIN) IVPB 1000 mg/200 mL premix 1,000 mg 200 mL/hr      MEDICATIONS: . atorvastatin  20 mg Oral QHS  . docusate sodium  100 mg Oral BID  . donepezil  5 mg Oral QHS  . febuxostat  40 mg Oral Daily  . ferrous sulfate  325 mg Oral Daily  . folic acid  1 mg Oral Daily  . gabapentin  300 mg Oral TID  . heparin  5,000 Units Subcutaneous Q8H  . insulin aspart  0-9 Units Subcutaneous TID WC  . insulin glargine  30 Units Subcutaneous Daily  . [START ON 05/07/2017] lamoTRIgine  50 mg Oral Daily  . levETIRAcetam  500 mg Oral BID  . levothyroxine  75 mcg Oral QAC breakfast  . patiromer  8.4 g Oral Daily  . sodium bicarbonate  1,300 mg Oral TID    Review of Systems - 11 systems reviewed and negative per HPI   OBJECTIVE: Temp:  [98.6  F (37 C)-103.1 F (39.5 C)] 99.2 F (37.3 C) (07/31 1937) Pulse Rate:  [78-108] 78 (07/31 1937) Resp:  [17-28] 19 (07/31 1937) BP: (111-144)/(59-93) 115/59 (07/31 1937) SpO2:  [93 %-100 %] 99 % (07/31 1937) Weight:  [75.3 kg (166 lb)-76.1 kg (167 lb 11.2 oz)] 76.1 kg (167 lb 11.2 oz) (07/31 9242) Physical Exam  Constitutional: confused, chronically  Ill appearing, lethargic  HENT: anicteric  Mouth/Throat: Oropharynx is clear and moist. No oropharyngeal exudate.  Cardiovascular: Normal rate, regular rhythm and normal heart sounds.  Pulmonary/Chest: Effort normal and breath sounds normal. No respiratory distress. He has no wheezes.  Abdominal: Soft. Bowel sounds are normal. He exhibits no distension. There is no tenderness.  Lymphadenopathy: He has no cervical adenopathy.  Neurological: He is lethargic and confused Skin: Skin is warm and dry. No rash noted. No erythema.  Psychiatric: lethartic  LABS: Results for orders placed or  performed during the hospital encounter of 05/05/17 (from the past 48 hour(s))  Basic metabolic panel     Status: Abnormal   Collection Time: 05/05/17 10:48 PM  Result Value Ref Range   Sodium 135 135 - 145 mmol/L   Potassium 4.5 3.5 - 5.1 mmol/L   Chloride 100 (L) 101 - 111 mmol/L   CO2 26 22 - 32 mmol/L   Glucose, Bld 127 (H) 65 - 99 mg/dL   BUN 31 (H) 6 - 20 mg/dL   Creatinine, Ser 2.46 (H) 0.61 - 1.24 mg/dL   Calcium 9.2 8.9 - 10.3 mg/dL   GFR calc non Af Amer 23 (L) >60 mL/min   GFR calc Af Amer 27 (L) >60 mL/min    Comment: (NOTE) The eGFR has been calculated using the CKD EPI equation. This calculation has not been validated in all clinical situations. eGFR's persistently <60 mL/min signify possible Chronic Kidney Disease.    Anion gap 9 5 - 15  CBC     Status: Abnormal   Collection Time: 05/05/17 10:48 PM  Result Value Ref Range   WBC 8.4 3.8 - 10.6 K/uL   RBC 3.32 (L) 4.40 - 5.90 MIL/uL   Hemoglobin 9.5 (L) 13.0 - 18.0 g/dL   HCT 28.3 (L) 40.0 - 52.0 %   MCV 85.3 80.0 - 100.0 fL   MCH 28.7 26.0 - 34.0 pg   MCHC 33.6 32.0 - 36.0 g/dL   RDW 14.3 11.5 - 14.5 %   Platelets 260 150 - 440 K/uL  Hepatic function panel     Status: Abnormal   Collection Time: 05/05/17 10:48 PM  Result Value Ref Range   Total Protein 7.1 6.5 - 8.1 g/dL   Albumin 3.6 3.5 - 5.0 g/dL   AST 16 15 - 41 U/L   ALT 12 (L) 17 - 63 U/L   Alkaline Phosphatase 67 38 - 126 U/L   Total Bilirubin 0.6 0.3 - 1.2 mg/dL   Bilirubin, Direct <0.1 (L) 0.1 - 0.5 mg/dL   Indirect Bilirubin NOT CALCULATED 0.3 - 0.9 mg/dL  Urinalysis, Complete w Microscopic     Status: Abnormal   Collection Time: 05/05/17 10:48 PM  Result Value Ref Range   Color, Urine COLORLESS (A) YELLOW   APPearance CLEAR (A) CLEAR   Specific Gravity, Urine 1.003 (L) 1.005 - 1.030   pH 7.0 5.0 - 8.0   Glucose, UA NEGATIVE NEGATIVE mg/dL   Hgb urine dipstick NEGATIVE NEGATIVE   Bilirubin Urine NEGATIVE NEGATIVE   Ketones, ur NEGATIVE  NEGATIVE mg/dL   Protein, ur NEGATIVE  NEGATIVE mg/dL   Nitrite NEGATIVE NEGATIVE   Leukocytes, UA NEGATIVE NEGATIVE   RBC / HPF NONE SEEN 0 - 5 RBC/hpf   WBC, UA 0-5 0 - 5 WBC/hpf   Bacteria, UA NONE SEEN NONE SEEN   Squamous Epithelial / LPF 0-5 (A) NONE SEEN  TSH     Status: None   Collection Time: 05/05/17 10:48 PM  Result Value Ref Range   TSH 0.988 0.350 - 4.500 uIU/mL    Comment: Performed by a 3rd Generation assay with a functional sensitivity of <=0.01 uIU/mL.  Lactic acid, plasma     Status: None   Collection Time: 05/05/17 10:49 PM  Result Value Ref Range   Lactic Acid, Venous 0.6 0.5 - 1.9 mmol/L  Glucose, capillary     Status: None   Collection Time: 05/06/17  8:08 AM  Result Value Ref Range   Glucose-Capillary 85 65 - 99 mg/dL  Glucose, capillary     Status: None   Collection Time: 05/06/17 11:34 AM  Result Value Ref Range   Glucose-Capillary 98 65 - 99 mg/dL  Glucose, capillary     Status: Abnormal   Collection Time: 05/06/17  3:50 PM  Result Value Ref Range   Glucose-Capillary 130 (H) 65 - 99 mg/dL  Glucose, capillary     Status: Abnormal   Collection Time: 05/06/17  6:16 PM  Result Value Ref Range   Glucose-Capillary 137 (H) 65 - 99 mg/dL   No components found for: ESR, C REACTIVE PROTEIN MICRO: Recent Results (from the past 720 hour(s))  Microscopic Examination     Status: Abnormal   Collection Time: 04/22/17  9:28 AM  Result Value Ref Range Status   WBC, UA 0-5 0 - 5 /hpf Final   RBC, UA None seen 0 - 2 /hpf Final   Epithelial Cells (non renal) 0-10 0 - 10 /hpf Final   Bacteria, UA Few (A) None seen/Few Final   04/23/17 [facility] Urine Culture, Routine - [facility] Final report (A)   Result 1 - [facility] Escherichia coli (A)  Comment: 50,000-100,000 colony forming units per mL   Antimicrobial Susceptibility - [facility] Comment  Comment: ** S = Susceptible; I = Intermediate; R = Resistant **  P = Positive; N =  Negative MICS are expressed in micrograms per mL  Antibiotic RSLT#[id number]#4 Amoxicillin/Clavulanic AcidI Ampicillin R CefazolinR Cefepime I CeftriaxoneR Cefuroxime R CiprofloxacinR ErtapenemS Gentamicin R Imipenem S Levofloxacin R MeropenemS Nitrofurantoin S Piperacillin/TazobactamS Tetracycline R Tobramycin I Trimethoprim/Sulfa R     IMAGING: 11/24/16 IMPRESSION: 1. 3 mm nonobstructive calculus in the upper pole collecting system of the right kidney and 4 mm nonobstructive calculus in the urinary bladder appears similar to the prior study. No ureteral stones or findings of urinary tract obstruction are noted at this time. 2. Mild colonic diverticulosis without evidence of acute diverticulitis at this time. 3. Aortic atherosclerosis. 4. Findings in the visualize lung bases suggests underlying interstitial lung disease. This could be better evaluated with followup nonemergent high-resolution chest CT in the next 6-12 months to assess for temporal changes in the appearance of the lung parenchyma. 5. Previously noted small focus of airspace consolidation in the left lower lobe has resolved, indicative of an infectious or inflammatory etiology on the prior examination. Dg Chest 1 View  Result Date: 05/06/2017 CLINICAL DATA:  83 y/o  M; PICC line placement. EXAM: CHEST 1 VIEW COMPARISON:  05/02/2017 chest radiograph FINDINGS: Stable cardiac silhouette. Left PICC line tip projects over the mid SVC unchanged  from prior radiograph. Linear opacity left lung base is likely minor atelectasis. No consolidation, effusion, or pneumothorax.  Bones are unremarkable. IMPRESSION: Left PICC line tip projects over mid SVC. Electronically Signed   By: Kristine Garbe M.D.   On: 05/06/2017 01:23   X-ray Chest Pa Or Ap  Result Date: 05/02/2017 CLINICAL DATA:  PICC insertion EXAM: CHEST 1 VIEW COMPARISON:  01/03/2017 FINDINGS: Left arm PICC tip in the mid SVC. Minimal right lower lobe atelectasis or scarring unchanged. Negative for heart failure or effusion. IMPRESSION: PICC tip in the mid SVC Electronically Signed   By: Franchot Gallo M.D.   On: 05/02/2017 13:59   Ct Head Wo Contrast  Result Date: 05/06/2017 CLINICAL DATA:  83 year old male with inability to ambulate and weakness. EXAM: CT HEAD WITHOUT CONTRAST TECHNIQUE: Contiguous axial images were obtained from the base of the skull through the vertex without intravenous contrast. COMPARISON:  Head CT dated 05/04/2016 FINDINGS: Brain: There is moderate age-related atrophy and chronic microvascular ischemic changes. There is no acute intracranial hemorrhage. No mass effect or midline shift noted. No intra-axial fluid collection. Vascular: No hyperdense vessel or unexpected calcification. Skull: Normal. Negative for fracture or focal lesion. Sinuses/Orbits: There is mild mucoperiosteal thickening of paranasal sinuses with partial opacification of multiple ethmoid air cells. There is a small air-fluid level in the left sphenoid sinus. The mastoid air cells are clear. Other: Small metallic object in the soft tissues of the posterior upper neck at the level of the foramen magnum. IMPRESSION: 1. No acute intracranial hemorrhage. 2. Moderate age-related atrophy and chronic microvascular ischemic changes. If symptoms persist, and there are no contraindications, MRI may provide better evaluation if clinically indicated. 3. Mild paranasal sinus disease. Electronically Signed   By: Anner Crete M.D.   On: 05/06/2017 03:52   Dg Chest Port 1 View  Result Date: 05/06/2017 CLINICAL DATA:  Sepsis  EXAM: PORTABLE CHEST 1 VIEW COMPARISON:  05/06/2017 FINDINGS: Stable left PICC. Normal heart size. Lungs under aerated with bibasilar atelectasis. No pneumothorax. IMPRESSION: Bibasilar atelectasis. Electronically Signed   By: Marybelle Killings M.D.   On: 05/06/2017 16:37   Ct Renal Stone Study  Result Date: 05/06/2017 CLINICAL DATA:  Nausea, generalized weakness, fever, possibly UTI EXAM: CT ABDOMEN AND PELVIS WITHOUT CONTRAST TECHNIQUE: Multidetector CT imaging of the abdomen and pelvis was performed following the standard protocol without IV contrast. COMPARISON:  11/24/2016 FINDINGS: Motion degraded images. Lower chest: Mild dependent atelectasis at the lung bases. Hepatobiliary: Unenhanced liver is unremarkable. Layering gallstones (series 2/ image 30), without associated inflammatory changes. Pancreas: Within normal limits. Spleen: Within normal limits. Adrenals/Urinary Tract: Adrenal glands are within normal limits. 3.0 cm cyst along the posterior right upper kidney (series 2/ image 39). Punctate nonobstructing right upper pole renal calculus (series 2/ image 33). 2.5 cm posterior left upper pole renal cyst (series 2/ image 33). No hydronephrosis. Suspected 2 mm distal left ureteral calculus at the UVJ (series 2/image 32), similar to the prior. Bladder is mildly thick-walled although underdistended. Stomach/Bowel: Stomach is notable for a posterior gastric diverticulum and a tiny hiatal hernia. No evidence of bowel obstruction. Appendix is not discretely visualized. Scattered right colonic diverticulosis, without evidence of diverticulitis. Vascular/Lymphatic: No evidence of abdominal aortic aneurysm. Mild atherosclerotic calcifications of the abdominal aorta and branch vessels. No suspicious abdominopelvic lymphadenopathy. Reproductive: Abnormal, enlarged, heterogeneous appearance of the prostate, new from the prior. Low-density appearance of the right peripheral gland (series 2/ image 83), with associated  6.2 x 5.1 x  7.0 cm low-density/fluid density lesion centrally within the prostate (series 2/image 77), indenting the base of the bladder. This appearance favors prostatitis with a periprostatic abscess. Other: No abdominopelvic ascites. Musculoskeletal: Mild degenerative changes of the lumbar spine. IMPRESSION: Motion degraded images. Prostatitis with a suspected 7.0 cm periprostatic abscess, as above. 2 mm distal left ureteral calculus at the UVJ, similar to the prior. Additional punctate nonobstructing right upper pole renal calculus. No hydronephrosis. Bladder is mildly thick-walled although underdistended. Additional ancillary findings as above. These results will be called to the ordering clinician or representative by the Radiologist Assistant, and communication documented in the PACS or zVision Dashboard. Electronically Signed   By: Julian Hy M.D.   On: 05/06/2017 20:10    Assessment:   NATHANAEL KRIST is a 83 y.o. male with several weeks of dysuria and repeat findings of ESBL E coli on UCX but minimal inflammation on UA, started on ertapenem 7.28 but now with progressive decline and fevers despite broad spectrum abx. He has hx prior stones as well as dementia. Followed by urology as otpt.  CT done today shows prostatitis with large periprostatic abscess. I spoke with urology and with IR. Will need IR Drainage of abscess if possible.  Recommendations Cont meropenem I have ordered CT guided drainage for AM Pt should remain NPO. Can dc vancomycin  Thank you very much for allowing me to participate in the care of this patient. Please call with questions.   Cheral Marker. Ola Spurr, MD

## 2017-05-06 NOTE — H&P (Signed)
Phillip Osborne is an 83 y.o. male.   Chief Complaint: Weakness HPI: The patient with past medical history of diabetes, hypertension seizures and recent urinary tract infection presents to the emergency department complaining of weakness. The patient had similar symptoms when he was recently diagnosed with a urinary tract infection which grew ESBL Escherichia coli. The patient has been on extended course of ertapenem since that time. He has been ambulating short distances in his home with the use of a walker. However today the patient was unable to ambulate without feeling as if he might fall. He has not had any falls. The pain is mostly in his back and his knees. This is chronic pain. Multiple times and really the patient in the emergency department were unsuccessful which prompted the emergency department staff to call the hospitalist service for admission.  Past Medical History:  Diagnosis Date  . Diabetes mellitus without complication ([place])   . Gout   . Hypertension   . Kidney stone   . Seizures ([place])    last seizure about 1 year ago(2017)  . Thyroid disease     Past Surgical History:  Procedure Laterality Date  . EXTRACORPOREAL SHOCK WAVE LITHOTRIPSY Left 02/22/2016   Procedure: EXTRACORPOREAL SHOCK WAVE LITHOTRIPSY (ESWL);  Surgeon: Nickie Retort, MD;  Location: [facility] ORS;  Service: Urology;  Laterality: Left;  . EYE SURGERY Left    cataract surgery  . HERNIA REPAIR  70's    Family History  Problem Relation Age of Onset  . Cancer Mother   . Diabetes Sister   . Kidney disease Neg Hx   . Prostate cancer Neg Hx   . Kidney cancer Neg Hx   . Bladder Cancer Neg Hx    Social History:  reports that he has never smoked. He has never used smokeless tobacco. He reports that he does not drink alcohol or use drugs.  Allergies:  Allergies  Allergen Reactions  . Enalapril Swelling    Face swelling.  . Lisinopril Swelling    mouth    Prior to Admission medications   Medication  Sig Start Date End Date Taking? Authorizing Provider  atorvastatin (LIPITOR) 20 MG tablet Take 20 mg by mouth at bedtime.  03/25/17 03/25/18 Yes [provider]  donepezil (ARICEPT) 5 MG tablet Take 5 mg by mouth at bedtime.   Yes [provider]  ertapenem 1 g in sodium chloride 0.9 % 50 mL Inject 1 g into the vein daily.   Yes [provider]  febuxostat (ULORIC) 40 MG tablet Take 40 mg by mouth daily.   Yes [provider]  ferrous sulfate 325 (65 FE) MG tablet Take 325 mg by mouth daily.   Yes [provider]  folic acid (FOLVITE) 1 MG tablet Take 1 mg by mouth daily.   Yes [provider]  gabapentin (NEURONTIN) 300 MG capsule Take 300 mg by mouth 3 (three) times daily.  10/08/16 01/03/18 Yes [provider]  glucose blood (ACCU-CHEK AVIVA) test strip 1 each by Other route 3 (three) times daily. (Fasting and 2 hours after largest meal)   Yes [provider]  HYDROcodone-acetaminophen (NORCO/VICODIN) 5-325 MG tablet Take 1 tablet by mouth every 6 (six) hours as needed for moderate pain.   Yes [provider]  insulin glargine (LANTUS) 100 UNIT/ML injection Inject 50 Units into the skin daily.    Yes [provider]  insulin lispro (HUMALOG) 100 UNIT/ML injection Inject 5 Units into the skin 3 (  three) times daily before meals. (Hold if CBG is less than 100)   Yes [provider]  lamoTRIgine (LAMICTAL) 25 MG tablet Take 1 tablet (25 mg total) by mouth daily. Patient taking differently: Take 50 mg by mouth 2 (two) times daily.  01/08/17  Yes Epifanio Lesches, MD  levETIRAcetam (KEPPRA) 500 MG tablet Take 500 mg by mouth 2 (two) times daily.  02/18/17 05/19/17 Yes [provider]  levothyroxine (SYNTHROID, LEVOTHROID) 75 MCG tablet Take 75 mcg by mouth daily.    Yes [provider]  patiromer (VELTASSA) 8.4 g packet Take 1 packet (8.4 g total) by mouth daily. 11/05/16  Yes Gladstone Lighter, MD  sodium bicarbonate 650 MG tablet Take 1,300 mg by mouth 3 (three) times daily.    Yes [provider]     Results for orders placed or performed during the hospital encounter of 05/05/17 (from the past 48 hour(s))  Basic metabolic panel     Status: Abnormal   Collection Time: 05/05/17 10:48 PM  Result Value Ref Range   Sodium 135 135 - 145 mmol/L   Potassium 4.5 3.5 - 5.1 mmol/L   Chloride 100 (L) 101 - 111 mmol/L   CO2 26 22 - 32 mmol/L   Glucose, Bld 127 (H) 65 - 99 mg/dL   BUN 31 (H) 6 - 20 mg/dL   Creatinine, Ser 2.46 (H) 0.61 - 1.24 mg/dL   Calcium 9.2 8.9 - 10.3 mg/dL   GFR calc non Af Amer 23 (L) >60 mL/min   GFR calc Af Amer 27 (L) >60 mL/min    Comment: (NOTE) The eGFR has been calculated using the CKD EPI equation. This calculation has not been validated in all clinical situations. eGFR's persistently <60 mL/min signify possible Chronic Kidney Disease.    Anion gap 9 5 - 15  CBC     Status: Abnormal   Collection Time: 05/05/17 10:48 PM  Result Value Ref Range   WBC 8.4 3.8 - 10.6 K/uL   RBC 3.32 (L) 4.40 - 5.90 MIL/uL   Hemoglobin 9.5 (L) 13.0 - 18.0 g/dL   HCT 28.3 (L) 40.0 - 52.0 %   MCV 85.3 80.0 - 100.0 fL   MCH 28.7 26.0 - 34.0 pg   MCHC 33.6 32.0 - 36.0 g/dL   RDW 14.3 11.5 - 14.5 %   Platelets 260 150 - 440 K/uL  Hepatic function panel     Status: Abnormal   Collection Time: 05/05/17 10:48 PM  Result Value Ref Range   Total Protein 7.1 6.5 - 8.1 g/dL   Albumin 3.6 3.5 - 5.0 g/dL   AST 16 15 - 41 U/L   ALT 12 (L) 17 - 63 U/L   Alkaline Phosphatase 67 38 - 126 U/L   Total Bilirubin 0.6 0.3 - 1.2 mg/dL   Bilirubin, Direct <0.1 (L) 0.1 - 0.5 mg/dL   Indirect Bilirubin NOT CALCULATED 0.3 - 0.9 mg/dL  Urinalysis, Complete w Microscopic     Status: Abnormal   Collection Time: 05/05/17 10:48 PM  Result Value Ref Range   Color, Urine COLORLESS (A) YELLOW   APPearance CLEAR (A) CLEAR   Specific Gravity, Urine 1.003 (L) 1.005 - 1.030    pH 7.0 5.0 - 8.0   Glucose, UA NEGATIVE NEGATIVE mg/dL   Hgb urine dipstick NEGATIVE NEGATIVE   Bilirubin Urine NEGATIVE NEGATIVE   Ketones, ur NEGATIVE NEGATIVE mg/dL   Protein, ur NEGATIVE NEGATIVE mg/dL   Nitrite NEGATIVE NEGATIVE   Leukocytes,  UA NEGATIVE NEGATIVE   RBC / HPF NONE SEEN 0 - 5 RBC/hpf   WBC, UA 0-5 0 - 5 WBC/hpf   Bacteria, UA NONE SEEN NONE SEEN   Squamous Epithelial / LPF 0-5 (A) NONE SEEN  Lactic acid, plasma     Status: None   Collection Time: 05/05/17 10:49 PM  Result Value Ref Range   Lactic Acid, Venous 0.6 0.5 - 1.9 mmol/L   Dg Chest 1 View  Result Date: 05/06/2017 CLINICAL DATA:  83 y/o  M; PICC line placement. EXAM: CHEST 1 VIEW COMPARISON:  05/02/2017 chest radiograph FINDINGS: Stable cardiac silhouette. Left PICC line tip projects over the mid SVC unchanged from prior radiograph. Linear opacity left lung base is likely minor atelectasis. No consolidation, effusion, or pneumothorax. Bones are unremarkable. IMPRESSION: Left PICC line tip projects over mid SVC. Electronically Signed   By: Kristine Garbe M.D.   On: 05/06/2017 01:23   Ct Head Wo Contrast  Result Date: 05/06/2017 CLINICAL DATA:  83 year old male with inability to ambulate and weakness. EXAM: CT HEAD WITHOUT CONTRAST TECHNIQUE: Contiguous axial images were obtained from the base of the skull through the vertex without intravenous contrast. COMPARISON:  Head CT dated 05/04/2016 FINDINGS: Brain: There is moderate age-related atrophy and chronic microvascular ischemic changes. There is no acute intracranial hemorrhage. No mass effect or midline shift noted. No intra-axial fluid collection. Vascular: No hyperdense vessel or unexpected calcification. Skull: Normal. Negative for fracture or focal lesion. Sinuses/Orbits: There is mild mucoperiosteal thickening of paranasal sinuses with partial opacification of multiple ethmoid air cells. There is a small air-fluid level in the left sphenoid sinus.  The mastoid air cells are clear. Other: Small metallic object in the soft tissues of the posterior upper neck at the level of the foramen magnum. IMPRESSION: 1. No acute intracranial hemorrhage. 2. Moderate age-related atrophy and chronic microvascular ischemic changes. If symptoms persist, and there are no contraindications, MRI may provide better evaluation if clinically indicated. 3. Mild paranasal sinus disease. Electronically Signed   By: Anner Crete M.D.   On: 05/06/2017 03:52    Review of Systems  Constitutional: Negative for chills and fever.  HENT: Negative for sore throat and tinnitus.   Eyes: Negative for blurred vision and redness.  Respiratory: Negative for cough and shortness of breath.   Cardiovascular: Negative for chest pain, palpitations, orthopnea and PND.  Gastrointestinal: Negative for abdominal pain, diarrhea, nausea and vomiting.  Genitourinary: Negative for dysuria, frequency and urgency.  Musculoskeletal: Positive for back pain. Negative for joint pain and myalgias.  Skin: Negative for rash.       No lesions  Neurological: Positive for weakness. Negative for speech change and focal weakness.  Endo/Heme/Allergies: Does not bruise/bleed easily.       No temperature intolerance  Psychiatric/Behavioral: Negative for depression and suicidal ideas.    Blood pressure 127/74, pulse 86, temperature 98.6 F (37 C), temperature source Oral, resp. rate 17, height '5\' 5"'$  (1.651 m), weight 75.3 kg (166 lb), SpO2 96 %. Physical Exam  Constitutional: He is oriented to person, place, and time. He appears well-developed and well-nourished. No distress.  HENT:  Head: Normocephalic and atraumatic.  Mouth/Throat: Oropharynx is clear and moist.  Eyes: Pupils are equal, round, and reactive to light. Conjunctivae and EOM are normal. No scleral icterus.  Neck: Normal range of motion. Neck supple. No JVD present. No tracheal deviation present. No thyromegaly present.  Cardiovascular:  Normal rate, regular rhythm and normal heart sounds.  Exam  reveals no gallop and no friction rub.   No murmur heard. Respiratory: Effort normal and breath sounds normal. No respiratory distress.  GI: Soft. Bowel sounds are normal. He exhibits no distension. There is no tenderness.  Genitourinary:  Genitourinary Comments: Deferred  Musculoskeletal: Normal range of motion. He exhibits no edema.  PICC line in place  Lymphadenopathy:    He has no cervical adenopathy.  Neurological: He is alert and oriented to person, place, and time. No cranial nerve deficit.  Skin: Skin is warm and dry. No rash noted. No erythema.  Psychiatric: He has a normal mood and affect. His behavior is normal. Judgment and thought content normal.     Assessment/Plan This is an 83 year old male admitted for generalized weakness. 1. Weakness: The patient is unable to intubate. PT/OT evaluation. 2. Hypertension: Acceptable for age; continue to monitor 3. CKD: Stage IV; hydrate with intravenous fluid. Avoid nephrotoxic agents. Patiromer for chronic hyperkalemia 4. Diabetes mellitus type 2: Continue basal insulin. Sliding scale insulin while hospitalized. Gabapentin for neuropathy 5. UTI: ESBL organism; continue ertapenem course   6. Seizure disorder: Continue Keppra and Lamictal 7. Hypothyroidism: Check TSH; continue Synthroid 8. Gout: Continue Uloric 9. Hyperlipidemia: Continue statin therapy 10. Mild cognitive delay: Continue Aricept 11. DVT prophylaxis: Heparin 12. GI prophylaxis: None The patient is a full code. Time spent on admission orders and patient care approximately 45 minutes  Harrie Foreman, MD 05/06/2017, 4:59 AM

## 2017-05-06 NOTE — Progress Notes (Signed)
Patient is found to be sleepy and drowsy. Difficult to arouse. Vital signs checked. VS within normal limits except for temperature of 100.9 F. Tylenol given. Temp was rechecked 99.9. BG 130.  Patient's lung sounds diminished. Pt unable to take deep breaths and coughing noted to be more frequent. Dr Amado CoeGouru notified and received orders for Speech eval., chest x-ray and NPO diet.  Stephannie PetersSusana G Patino Hernandez, RN

## 2017-05-06 NOTE — Progress Notes (Signed)
Pharmacy Antibiotic Note  Phillip Osborne is a 83 y.o. male admitted on 05/05/2017 with  sepsis secondary to ESBL E. Coli UTI. Patient was receiving Ertapenem 1g IV every 24 hours PTA starting 7/28. Dr. Amado CoeGouru concerned that there is an additional source of infection due to patient having weakness and fever of 102.8. Pharmacy has been consulted for vancomycin and meropenem dosing.  Plan: Ke: 0.024   Vd: 53.2  T1/2: 28.9  Will start patient on vancomycin 1g IV every 36 hours with 6 hour stack dosing. Calculated trough at Css is 15.8. Trough ordered prior to 4th dose. Will monitor renal function and adjust dose as needed.   Will start patient on Meropenem 500mg  IV very 12 hours based on current CrCl <4330ml/min.   Height: 5\' 5"  (165.1 cm) Weight: 167 lb 11.2 oz (76.1 kg) IBW/kg (Calculated) : 61.5  Temp (24hrs), Avg:100.5 F (38.1 C), Min:98.6 F (37 C), Max:102.8 F (39.3 C)   Recent Labs Lab 05/05/17 2248 05/05/17 2249  WBC 8.4  --   CREATININE 2.46*  --   LATICACIDVEN  --  0.6    Estimated Creatinine Clearance: 22 mL/min (A) (by C-G formula based on SCr of 2.46 mg/dL (H)).    Allergies  Allergen Reactions  . Enalapril Swelling    Face swelling.  . Lisinopril Swelling    mouth    Antimicrobials this admission: 7/31 Ertapenem 1g IV x 1 dose (Started outpatient on 7/28)  7/31 meropenem >>  7/31 vancomycin>>   Dose adjustments this admission:  Microbiology results: 7/30 QIH:[id number]:Pending  7/30 UCx: sent   Thank you for allowing pharmacy to be a part of this patient's care.  Gardner CandleSheema M Aniya Jolicoeur, PharmD, BCPS Clinical Pharmacist 05/06/2017 10:30 AM

## 2017-05-06 NOTE — Care Management Obs Status (Signed)
MEDICARE OBSERVATION STATUS NOTIFICATION   Patient Details  Name: Phillip Osborne MRN: [id number] Date of Birth: 1931/11/11   Medicare Observation Status Notification Given:  Yes    Marily MemosLisa M Allyn Bertoni, RN 05/06/2017, 8:23 AM

## 2017-05-06 NOTE — ED Notes (Signed)
Attempted to assist pt with using a Phillip Osborne, per his normal. Pt was unable to walk. Pt stood up but shifted his weight to this RN who had to support him which standing. Tech Scott helped this RN assist pt back into bed. NP made aware.

## 2017-05-06 NOTE — Progress Notes (Signed)
PHARMACY NOTE -  ANTIBIOTIC RENAL DOSE ADJUSTMENT   [facility] for renal dose adjustment and make adjustments as needed.   Patient has been initiated on Ertapenem 1g IV every 24 hours for ESBL E.Coli UTI.  SCr 2.46, estimated CrCl 22 ml/min  Pharmacy has adjusted the dose to Ertapenem 500mg  IV every 24 hours based on patient's current CrCl of <2630ml/min.   Pharmacy will continue to monitor renal function and adjust dose as needed.   Gardner CandleSheema M Sol Odor, PharmD, BCPS Clinical Pharmacist 05/06/2017 7:45 AM

## 2017-05-06 NOTE — ED Notes (Signed)
Phillip LivingBenita Osborne, daughter left contact number [phone]

## 2017-05-06 NOTE — ED Notes (Signed)
Patient transported to CT 

## 2017-05-06 NOTE — Care Management (Addendum)
Followed by [facility] for nursing. [facility] is giving IV antibiotics (IV Invanz).

## 2017-05-06 NOTE — ED Notes (Signed)
Upon shift change, NS bolus disconnected and hanging from pole previous to RN entering room.

## 2017-05-06 NOTE — ED Provider Notes (Signed)
[facility] Emergency Department Provider Note  ____________________________________________   First MD Initiated Contact with Patient 05/05/17 2301     (approximate)  I have reviewed the triage vital signs and the nursing notes.   HISTORY  Chief Complaint Weakness and Dysuria   HPI OLIVERIO CHO is a 83 y.o. male who presents to the emergency department for evaluation of weakness and dysuria. Family state that he had a PICC line inserted here on Friday and has been receiving INVANZ at home for UTI. This evening, patient was unable to get out of his chair after supper and feels weaker than usual. He states that he is having pain with urination.   He has been evaluated multiple times for dysuria since early July. He has been on Macrobid, then stopped by urology due to visualization of small nonobstructive right renal stone. Continuation of pain prompted urology to start him on Cephalexin and urine culture was obtained that was resistant to multiple antibiotics which lead to the PICC line placement and daily administration of ertapenem.    Past Medical History:  Diagnosis Date  . Diabetes mellitus without complication (HCC)   . Gout   . Hypertension   . Kidney stone   . Seizures (HCC)    last seizure about 1 year ago(2017)  . Thyroid disease     Patient Active Problem List   Diagnosis Date Noted  . Hyperkalemia 11/04/2016  . Left ureteral stone 02/20/2016  . Gross hematuria 02/20/2016    Past Surgical History:  Procedure Laterality Date  . EXTRACORPOREAL SHOCK WAVE LITHOTRIPSY Left 02/22/2016   Procedure: EXTRACORPOREAL SHOCK WAVE LITHOTRIPSY (ESWL);  Surgeon: Hildred Laser, MD;  Location: [facility] ORS;  Service: Urology;  Laterality: Left;  . EYE SURGERY Left    cataract surgery  . HERNIA REPAIR  70's    Prior to Admission medications   Medication Sig Start Date End Date Taking? Authorizing Provider  atorvastatin (LIPITOR) 20 MG tablet  Take by mouth. 03/25/17 03/25/18  [provider]  divalproex (DEPAKOTE SPRINKLE) 125 MG capsule Take 2 capsules (250 mg total) by mouth every 12 (twelve) hours. 01/07/17   Katha Hamming, MD  donepezil (ARICEPT) 5 MG tablet Take 5 mg by mouth at bedtime.    [provider]  febuxostat (ULORIC) 40 MG tablet Take 40 mg by mouth daily.    [provider]  ferrous sulfate 325 (65 FE) MG tablet Take 325 mg by mouth daily.    [provider]  fludrocortisone (FLORINEF) 0.1 MG tablet Take 1 tablet (0.1 mg total) by mouth daily. Patient not taking: Reported on 04/22/2017 01/08/17   Katha Hamming, MD  folic acid (FOLVITE) 1 MG tablet Take 1 mg by mouth daily.    [provider]  furosemide (LASIX) 20 MG tablet Take 20 mg by mouth every morning.    [provider]  gabapentin (NEURONTIN) 100 MG capsule Take 200 mg by mouth 2 (two) times daily. 10/08/16 01/03/18  [provider]  glucose blood (ACCU-CHEK AVIVA) test strip 1 each by Other route 3 (three) times daily. (Fasting and 2 hours after largest meal)    [provider]  insulin glargine (LANTUS) 100 UNIT/ML injection Inject 50 Units into the skin daily.     [provider]  insulin lispro (HUMALOG) 100 UNIT/ML injection Inject 5 Units into the skin 3 (three) times daily before meals. (Hold if CBG is less than 100)    [provider]  lamoTRIgine (LAMICTAL) 25 MG tablet Take 1 tablet (25 mg total) by mouth daily. 01/08/17   Katha HammingKonidena, Snehalatha, MD  lamoTRIgine (LAMICTAL) 25 MG tablet Take 2 tablets (50 mg total) by mouth at bedtime. Patient not taking: Reported on 04/22/2017 01/07/17   Katha HammingKonidena, Snehalatha, MD  levETIRAcetam (KEPPRA) 500 MG tablet Take by mouth. 02/18/17 05/19/17  [provider]  levothyroxine (SYNTHROID, LEVOTHROID) 75 MCG tablet Take 75 mcg by mouth daily.     [provider]  patiromer (VELTASSA) 8.4 g packet Take 1 packet (8.4 g  total) by mouth daily. 11/05/16   Enid BaasKalisetti, Radhika, MD  sodium bicarbonate 650 MG tablet Take 1,300 mg by mouth 3 (three) times daily.     [provider]  tamsulosin (FLOMAX) 0.4 MG CAPS capsule Take 0.4 mg by mouth.    [provider]  Valproic Acid 250 MG CPDR Take 250 mg by mouth 2 (two) times daily.    [provider]    Allergies Enalapril and Lisinopril  Family History  Problem Relation Age of Onset  . Cancer Mother   . Diabetes Sister   . Kidney disease Neg Hx   . Prostate cancer Neg Hx   . Kidney cancer Neg Hx   . Bladder Cancer Neg Hx     Social History Social History  Substance Use Topics  . Smoking status: Never Smoker  . Smokeless tobacco: Never Used  . Alcohol use No    Review of Systems  Constitutional: No fever/chills Eyes: No visual changes. ENT: No sore throat. Cardiovascular: Denies chest pain. Respiratory: Denies shortness of breath. Gastrointestinal: No abdominal pain.  No nausea, no vomiting.  No diarrhea.  No constipation. Genitourinary: Positive for dysuria. Musculoskeletal: Negative for back pain. Skin: Negative for rash. Neurological: Negative for headaches, focal weakness or numbness. ____________________________________________   PHYSICAL EXAM:  VITAL SIGNS: ED Triage Vitals  Enc Vitals Group     BP 05/05/17 2214 118/72     Pulse Rate 05/05/17 2214 83     Resp 05/05/17 2214 18     Temp 05/05/17 2214 98.6 F (37 C)     Temp Source 05/05/17 2214 Oral     SpO2 05/05/17 2214 97 %     Weight 05/05/17 2215 166 lb (75.3 kg)     Height 05/05/17 2215 5\' 5"  (1.651 m)     Head Circumference --      Peak Flow --      Pain Score 05/05/17 2211 8     Pain Loc --      Pain Edu? --      Excl. in GC? --     Constitutional: Alert and oriented to person and place. Chronically ill appearing and in no acute distress. Eyes: Conjunctivae are normal. PERRL. EOMI. Head: Atraumatic. Nose: No  congestion/rhinnorhea. Mouth/Throat: Mucous membranes are moist.  Oropharynx non-erythematous. Neck: No stridor.   Cardiovascular: Normal rate, regular rhythm. Grossly normal heart sounds.  Good peripheral circulation. Respiratory: Normal respiratory effort.  No retractions. Lungs CTAB. Gastrointestinal: Soft and nontender. No distention. No abdominal bruits. No CVA tenderness. Musculoskeletal: No lower extremity tenderness nor edema.  No joint effusions. Neurologic:  Normal speech and language. No gross focal neurologic deficits are appreciated. Strength is 4/5 in upper extremities and 5/5 in lower extremities bilaterally. Skin:  Skin is warm, dry and intact. No rash noted. Psychiatric: Mood and affect are normal. Speech and behavior are normal.  ____________________________________________   LABS (all labs ordered are listed, but only  abnormal results are displayed)  Labs Reviewed  BASIC METABOLIC PANEL - Abnormal; Notable for the following:       Result Value   Chloride 100 (*)    Glucose, Bld 127 (*)    BUN 31 (*)    Creatinine, Ser 2.46 (*)    GFR calc non Af Amer 23 (*)    GFR calc Af Amer 27 (*)    All other components within normal limits  CBC - Abnormal; Notable for the following:    RBC 3.32 (*)    Hemoglobin 9.5 (*)    HCT 28.3 (*)    All other components within normal limits  HEPATIC FUNCTION PANEL - Abnormal; Notable for the following:    ALT 12 (*)    Bilirubin, Direct <0.1 (*)    All other components within normal limits  URINALYSIS, COMPLETE (UACMP) WITH MICROSCOPIC - Abnormal; Notable for the following:    Color, Urine COLORLESS (*)    APPearance CLEAR (*)    Specific Gravity, Urine 1.003 (*)    Squamous Epithelial / LPF 0-5 (*)    All other components within normal limits  LACTIC ACID, PLASMA  CBG MONITORING, ED   ____________________________________________  EKG  ED ECG REPORT I, Caliegh Middlekauff, FNP-BC, personally viewed and interpreted this ECG.    Date: 05/06/2017  EKG Time: 2213  Rate: 88  Rhythm: normal EKG, normal sinus rhythm  Axis: left axis  Intervals:none  ST&T Change: none  ____________________________________________  RADIOLOGY  Dg Chest 1 View  Result Date: 05/06/2017 CLINICAL DATA:  83 y/o  M; PICC line placement. EXAM: CHEST 1 VIEW COMPARISON:  05/02/2017 chest radiograph FINDINGS: Stable cardiac silhouette. Left PICC line tip projects over the mid SVC unchanged from prior radiograph. Linear opacity left lung base is likely minor atelectasis. No consolidation, effusion, or pneumothorax. Bones are unremarkable. IMPRESSION: Left PICC line tip projects over mid SVC. Electronically Signed   By: Mitzi Hansen M.D.   On: 05/06/2017 01:23    ____________________________________________   PROCEDURES  Procedure(s) performed: None  Procedures  Critical Care performed: No  ____________________________________________   INITIAL IMPRESSION / ASSESSMENT AND PLAN / ED COURSE  Pertinent labs & imaging results that were available during my care of the patient were reviewed by me and considered in my medical decision making (see chart for details).  82 year old male presenting to the emergency department for evaluation of dysuria. Family state that he was unable to stand up from a seated position while on the couch due to an increase in weakness. They state that today he was ambulating around the house with his walker. He states that he doesn't want to eat or drink because he is afraid he will have to urinate, and it hurts too bad.   ----------------------------------------- 3:23 AM on 05/06/2017 -----------------------------------------  Patient unable to ambulate even with assistance and therefore will be admitted for further evaluation.  Clinical Course as of May 06 305  Tue May 06, 2017  0103 Color, Urine: Sandi Mealy [CT]    Clinical Course User Index [CT] Jaelynn Currier, Kasandra Knudsen, FNP      ____________________________________________   FINAL CLINICAL IMPRESSION(S) / ED DIAGNOSES  Final diagnoses:  S/P PICC central line placement  Weakness  Dysuria      NEW MEDICATIONS STARTED DURING THIS VISIT:  New Prescriptions   No medications on file     Note:  This document was prepared using Dragon voice recognition software and may include unintentional dictation errors.  Chinita Pesterriplett, Camry Theiss B, FNP 05/06/17 0325    Darci CurrentBrown, Wauna N, MD 05/06/17 [phone]

## 2017-05-06 NOTE — ED Notes (Signed)
PICC line flushed, clamped with upper clamp, lower clamp not fastened.

## 2017-05-06 NOTE — Progress Notes (Signed)
[facility] [place] [facility]   PATIENT NAME: Phillip BellsCurtis Osborne    MR#:  [id number]  DATE OF BIRTH:  1932/08/22  SUBJECTIVE:  CHIEF COMPLAINT:  Patient is reporting nausea and generalized weakness. Febrile  REVIEW OF SYSTEMS:  CONSTITUTIONAL: No fever, fatigue or weakness.  EYES: No blurred or double vision.  EARS, NOSE, AND THROAT: No tinnitus or ear pain.  RESPIRATORY: No cough, shortness of breath, wheezing or hemoptysis.  CARDIOVASCULAR: No chest pain, orthopnea, edema.  GASTROINTESTINAL: Reports nausea, denies vomiting, diarrhea or abdominal pain.  GENITOURINARY: No dysuria, hematuria.  ENDOCRINE: No polyuria, nocturia,  HEMATOLOGY: No anemia, easy bruising or bleeding SKIN: No rash or lesion. MUSCULOSKELETAL: No joint pain or arthritis.   NEUROLOGIC: No tingling, numbness, weakness.  PSYCHIATRY: No anxiety or depression.   DRUG ALLERGIES:   Allergies  Allergen Reactions  . Enalapril Swelling    Face swelling.  . Lisinopril Swelling    mouth    VITALS:  Blood pressure 136/79, pulse 95, temperature (!) 100.7 F (38.2 C), temperature source Oral, resp. rate 20, height 5\' 5"  (1.651 m), weight 76.1 kg (167 lb 11.2 oz), SpO2 96 %.  PHYSICAL EXAMINATION:  GENERAL:  83 y.o.-year-old patient lying in the bed with no acute distress.  EYES: Pupils equal, round, reactive to light and accommodation. No scleral icterus. Extraocular muscles intact.  HEENT: Head atraumatic, normocephalic. Oropharynx and nasopharynx clear.  NECK:  Supple, no jugular venous distention. No thyroid enlargement, no tenderness.  LUNGS: Normal breath sounds bilaterally, no wheezing, rales,rhonchi or crepitation. No use of accessory muscles of respiration.  CARDIOVASCULAR: S1, S2 normal. No murmurs, rubs, or gallops.  ABDOMEN: Soft, nontender, nondistended. Bowel sounds present. No organomegaly or mass.  EXTREMITIES: No pedal edema, cyanosis, or clubbing.  NEUROLOGIC:  Cranial nerves are grossly intact. Muscle strength 5/5 in all extremities. Sensation intact. Gait not checked.  PSYCHIATRIC: The patient is alert and oriented x2- 3.  SKIN: No obvious rash, lesion, or ulcer.    LABORATORY PANEL:   CBC  Recent Labs Lab 05/05/17 2248  WBC 8.4  HGB 9.5*  HCT 28.3*  PLT 260   ------------------------------------------------------------------------------------------------------------------  Chemistries   Recent Labs Lab 05/05/17 2248  NA 135  K 4.5  CL 100*  CO2 26  GLUCOSE 127*  BUN 31*  CREATININE 2.46*  CALCIUM 9.2  AST 16  ALT 12*  ALKPHOS 67  BILITOT 0.6   ------------------------------------------------------------------------------------------------------------------  Cardiac Enzymes No results for input(s): TROPONINI in the last 168 hours. ------------------------------------------------------------------------------------------------------------------  RADIOLOGY:  Dg Chest 1 View  Result Date: 05/06/2017 CLINICAL DATA:  83 y/o  M; PICC line placement. EXAM: CHEST 1 VIEW COMPARISON:  05/02/2017 chest radiograph FINDINGS: Stable cardiac silhouette. Left PICC line tip projects over the mid SVC unchanged from prior radiograph. Linear opacity left lung base is likely minor atelectasis. No consolidation, effusion, or pneumothorax. Bones are unremarkable. IMPRESSION: Left PICC line tip projects over mid SVC. Electronically Signed   By: Mitzi HansenLance  Furusawa-Stratton M.D.   On: 05/06/2017 01:23   Ct Head Wo Contrast  Result Date: 05/06/2017 CLINICAL DATA:  83 year old male with inability to ambulate and weakness. EXAM: CT HEAD WITHOUT CONTRAST TECHNIQUE: Contiguous axial images were obtained from the base of the skull through the vertex without intravenous contrast. COMPARISON:  Head CT dated 05/04/2016 FINDINGS: Brain: There is moderate age-related atrophy and chronic microvascular ischemic changes. There is no acute intracranial hemorrhage.  No mass effect or midline shift noted. No intra-axial fluid collection.  Vascular: No hyperdense vessel or unexpected calcification. Skull: Normal. Negative for fracture or focal lesion. Sinuses/Orbits: There is mild mucoperiosteal thickening of paranasal sinuses with partial opacification of multiple ethmoid air cells. There is a small air-fluid level in the left sphenoid sinus. The mastoid air cells are clear. Other: Small metallic object in the soft tissues of the posterior upper neck at the level of the foramen magnum. IMPRESSION: 1. No acute intracranial hemorrhage. 2. Moderate age-related atrophy and chronic microvascular ischemic changes. If symptoms persist, and there are no contraindications, MRI may provide better evaluation if clinically indicated. 3. Mild paranasal sinus disease. Electronically Signed   By: Elgie CollardArash  Radparvar M.D.   On: 05/06/2017 03:52    EKG:   Orders placed or performed during the hospital encounter of 05/05/17  . EKG 12-Lead  . EKG 12-Lead  . ED EKG  . ED EKG    ASSESSMENT AND PLAN:   #Sepsis-probably secondary to ESBL Escherichia coli UTI Patient is still febrile though he is on Invanz, broaden the coverage with meropenem and vancomycin Blood cultures and urine cultures were ordered which were not done in the emergency department ID consult Repeat CBC with differential and BMP in a.m.  #Chronic kidney disease stage IV Avoid nephrotoxins and monitor renal function Hydrate with IV fluids  #Hypothyroidism TSH normal continue Synthroid  #Gout-stable continue ULORIC  #Seizure disorder-continue home medication Keppra and Lamictal   DVT prophylaxis with heparin subcutaneous    All the records are reviewed and case discussed with Care Management/Social Workerr. Management plans discussed with the patient, family and they are in agreement.  CODE STATUS:   TOTAL TIME TAKING CARE OF THIS PATIENT: 36minutes.   POSSIBLE D/C IN 2-3 DAYS, DEPENDING ON  CLINICAL CONDITION.  Note: This dictation was prepared with Dragon dictation along with smaller phrase technology. Any transcriptional errors that result from this process are unintentional.   Ramonita LabGouru, Cecillia Menees M.D on 05/06/2017 at 10:51 AM  Between 7am to 6pm - Pager - [phone] After 6pm go to [email or web address] - password [email or web address] [facility]  [facility]  Office  [phone]  CC: Primary care physician; Marisue IvanLinthavong, Kanhka, MD

## 2017-05-07 ENCOUNTER — Observation Stay: Payer: Medicare Other | Admitting: Anesthesiology

## 2017-05-07 ENCOUNTER — Ambulatory Visit: Payer: Medicare Other | Admitting: Urology

## 2017-05-07 ENCOUNTER — Encounter
Admission: EM | Disposition: A | Discharge status: Home Health | Payer: Self-pay | Source: Home / Self Care | Attending: Internal Medicine

## 2017-05-07 ENCOUNTER — Encounter: Payer: Self-pay | Admitting: Anesthesiology

## 2017-05-07 DIAGNOSIS — N412 Abscess of prostate: Secondary | ICD-10-CM

## 2017-05-07 DIAGNOSIS — N201 Calculus of ureter: Secondary | ICD-10-CM | POA: Diagnosis not present

## 2017-05-07 HISTORY — PX: TRANSURETHRAL RESECTION OF PROSTATE: SHX73

## 2017-05-07 HISTORY — PX: CYSTOSCOPY/URETEROSCOPY/HOLMIUM LASER/STENT PLACEMENT: SHX6546

## 2017-05-07 LAB — COMPREHENSIVE METABOLIC PANEL
ALBUMIN: 3.3 g/dL — AB (ref 3.5–5.0)
ALK PHOS: 73 U/L (ref 38–126)
ALT: 15 U/L — ABNORMAL LOW (ref 17–63)
ANION GAP: 7 (ref 5–15)
AST: 20 U/L (ref 15–41)
BILIRUBIN TOTAL: 0.9 mg/dL (ref 0.3–1.2)
BUN: 33 mg/dL — AB (ref 6–20)
CALCIUM: 8.8 mg/dL — AB (ref 8.9–10.3)
CO2: 26 mmol/L (ref 22–32)
CREATININE: 2.06 mg/dL — AB (ref 0.61–1.24)
Chloride: 105 mmol/L (ref 101–111)
GFR calc Af Amer: 33 mL/min — ABNORMAL LOW (ref >60)
GFR calc non Af Amer: 28 mL/min — ABNORMAL LOW (ref >60)
GLUCOSE: 68 mg/dL (ref 65–99)
Potassium: 4 mmol/L (ref 3.5–5.1)
SODIUM: 138 mmol/L (ref 135–145)
TOTAL PROTEIN: 6.9 g/dL (ref 6.5–8.1)

## 2017-05-07 LAB — PROTIME-INR
INR: 1.13
Prothrombin Time: 14.6 seconds (ref 11.4–15.2)

## 2017-05-07 LAB — CBC WITH DIFFERENTIAL/PLATELET
Basophils Absolute: 0 10*3/uL (ref 0–0.1)
Basophils Relative: 0 %
EOS PCT: 1 %
Eosinophils Absolute: 0.1 10*3/uL (ref 0–0.7)
HEMATOCRIT: 27.9 % — AB (ref 40.0–52.0)
Hemoglobin: 9.5 g/dL — ABNORMAL LOW (ref 13.0–18.0)
LYMPHS ABS: 0.7 10*3/uL — AB (ref 1.0–3.6)
LYMPHS PCT: 8 %
MCH: 29.2 pg (ref 26.0–34.0)
MCHC: 33.9 g/dL (ref 32.0–36.0)
MCV: 86.1 fL (ref 80.0–100.0)
MONO ABS: 0.9 10*3/uL (ref 0.2–1.0)
Monocytes Relative: 9 %
NEUTROS ABS: 7.5 10*3/uL — AB (ref 1.4–6.5)
Neutrophils Relative %: 82 %
PLATELETS: 237 10*3/uL (ref 150–440)
RBC: 3.24 MIL/uL — AB (ref 4.40–5.90)
RDW: 14.5 % (ref 11.5–14.5)
WBC: 9.2 10*3/uL (ref 3.8–10.6)

## 2017-05-07 LAB — GLUCOSE, CAPILLARY
Glucose-Capillary: 76 mg/dL (ref 65–99)
Glucose-Capillary: 63 mg/dL — ABNORMAL LOW (ref 65–99)
Glucose-Capillary: 247 mg/dL — ABNORMAL HIGH (ref 65–99)
Glucose-Capillary: 256 mg/dL — ABNORMAL HIGH (ref 65–99)
Glucose-Capillary: 228 mg/dL — ABNORMAL HIGH (ref 65–99)
Glucose-Capillary: 381 mg/dL — ABNORMAL HIGH (ref 65–99)

## 2017-05-07 LAB — BASIC METABOLIC PANEL
Anion gap: 9 (ref 5–15)
BUN: 27 mg/dL — AB (ref 6–20)
CO2: 26 mmol/L (ref 22–32)
Calcium: 9.2 mg/dL (ref 8.9–10.3)
Chloride: 106 mmol/L (ref 101–111)
Creatinine, Ser: 2.13 mg/dL — ABNORMAL HIGH (ref 0.61–1.24)
GFR calc Af Amer: 32 mL/min — ABNORMAL LOW (ref >60)
GFR, EST NON AFRICAN AMERICAN: 27 mL/min — AB (ref >60)
GLUCOSE: 71 mg/dL (ref 65–99)
POTASSIUM: 4.3 mmol/L (ref 3.5–5.1)
Sodium: 141 mmol/L (ref 135–145)

## 2017-05-07 LAB — URINE CULTURE
CULTURE: NO GROWTH
SPECIAL REQUESTS: NORMAL

## 2017-05-07 LAB — SURGICAL PCR SCREEN
MRSA, PCR: NEGATIVE
Staphylococcus aureus: NEGATIVE

## 2017-05-07 LAB — HEMOGLOBIN A1C
Hgb A1c MFr Bld: 7.6 % — ABNORMAL HIGH (ref 4.8–5.6)
Mean Plasma Glucose: 171 mg/dL

## 2017-05-07 SURGERY — TURP (TRANSURETHRAL RESECTION OF PROSTATE)
Anesthesia: General | Site: Ureter | Wound class: Clean Contaminated

## 2017-05-07 MED ORDER — LIDOCAINE HCL (CARDIAC) 20 MG/ML IV SOLN
INTRAVENOUS | Status: DC | PRN
  Administered 2017-05-07: 100 mg via INTRAVENOUS

## 2017-05-07 MED ORDER — HALOPERIDOL LACTATE 5 MG/ML IJ SOLN
5.0000 mg | Freq: Once | INTRAMUSCULAR | Status: AC
  Administered 2017-05-07: 5 mg via INTRAVENOUS
  Filled 2017-05-07: qty 1

## 2017-05-07 MED ORDER — HALOPERIDOL LACTATE 5 MG/ML IJ SOLN
INTRAMUSCULAR | Status: AC
  Administered 2017-05-07: 5 mg via INTRAVENOUS
  Filled 2017-05-07: qty 1

## 2017-05-07 MED ORDER — FENTANYL CITRATE (PF) 100 MCG/2ML IJ SOLN
INTRAMUSCULAR | Status: DC | PRN
  Administered 2017-05-07: 50 ug via INTRAVENOUS
  Administered 2017-05-07 (×2): 25 ug via INTRAVENOUS
  Administered 2017-05-07 (×2): 50 ug via INTRAVENOUS

## 2017-05-07 MED ORDER — HALOPERIDOL LACTATE 5 MG/ML IJ SOLN
5.0000 mg | Freq: Once | INTRAMUSCULAR | Status: AC
  Administered 2017-05-07: 5 mg via INTRAVENOUS

## 2017-05-07 MED ORDER — DEXAMETHASONE SODIUM PHOSPHATE 10 MG/ML IJ SOLN
INTRAMUSCULAR | Status: AC
  Filled 2017-05-07: qty 1

## 2017-05-07 MED ORDER — SODIUM CHLORIDE 0.9% FLUSH: 10.0000 mL | INTRAVENOUS | Status: DC | PRN

## 2017-05-07 MED ORDER — HALOPERIDOL LACTATE 5 MG/ML IJ SOLN
INTRAMUSCULAR | Status: AC
  Filled 2017-05-07: qty 1

## 2017-05-07 MED ORDER — ROCURONIUM BROMIDE 50 MG/5ML IV SOLN
INTRAVENOUS | Status: AC
  Filled 2017-05-07: qty 1

## 2017-05-07 MED ORDER — PROPOFOL 10 MG/ML IV BOLUS
INTRAVENOUS | Status: AC
  Filled 2017-05-07: qty 20

## 2017-05-07 MED ORDER — SUCCINYLCHOLINE CHLORIDE 20 MG/ML IJ SOLN
INTRAMUSCULAR | Status: DC | PRN
  Administered 2017-05-07: 80 mg via INTRAVENOUS

## 2017-05-07 MED ORDER — FENTANYL CITRATE (PF) 100 MCG/2ML IJ SOLN
INTRAMUSCULAR | Status: AC
  Administered 2017-05-07: 25 ug via INTRAVENOUS
  Filled 2017-05-07: qty 2

## 2017-05-07 MED ORDER — ROCURONIUM BROMIDE 100 MG/10ML IV SOLN
INTRAVENOUS | Status: DC | PRN
  Administered 2017-05-07: 30 mg via INTRAVENOUS
  Administered 2017-05-07: 20 mg via INTRAVENOUS

## 2017-05-07 MED ORDER — FENTANYL CITRATE (PF) 100 MCG/2ML IJ SOLN
INTRAMUSCULAR | Status: AC
  Filled 2017-05-07: qty 2

## 2017-05-07 MED ORDER — ONDANSETRON HCL 4 MG/2ML IJ SOLN
INTRAMUSCULAR | Status: DC | PRN
  Administered 2017-05-07: 4 mg via INTRAVENOUS

## 2017-05-07 MED ORDER — OXYCODONE HCL 5 MG PO TABS: 5.0000 mg | ORAL_TABLET | Freq: Once | ORAL | Status: DC | PRN

## 2017-05-07 MED ORDER — ETOMIDATE 2 MG/ML IV SOLN
INTRAVENOUS | Status: DC | PRN
  Administered 2017-05-07: 16 mg via INTRAVENOUS

## 2017-05-07 MED ORDER — PROPOFOL 10 MG/ML IV BOLUS
INTRAVENOUS | Status: DC | PRN
  Administered 2017-05-07: 30 mg via INTRAVENOUS

## 2017-05-07 MED ORDER — ETOMIDATE 2 MG/ML IV SOLN
INTRAVENOUS | Status: AC
  Filled 2017-05-07: qty 10

## 2017-05-07 MED ORDER — OXYCODONE HCL 5 MG/5ML PO SOLN: 5.0000 mg | Freq: Once | ORAL | Status: DC | PRN

## 2017-05-07 MED ORDER — DEXTROSE-NACL 5-0.9 % IV SOLN
INTRAVENOUS | Status: DC | PRN
  Administered 2017-05-07: 13:00:00 via INTRAVENOUS

## 2017-05-07 MED ORDER — MEPERIDINE HCL 50 MG/ML IJ SOLN
6.2500 mg | Freq: Once | INTRAMUSCULAR | Status: AC
  Administered 2017-05-07: 6.25 mg via INTRAVENOUS

## 2017-05-07 MED ORDER — MEPERIDINE HCL 50 MG/ML IJ SOLN
INTRAMUSCULAR | Status: AC
Start: 2017-05-07 — End: 2017-05-07
  Administered 2017-05-07: 6.25 mg via INTRAVENOUS
  Filled 2017-05-07: qty 1

## 2017-05-07 MED ORDER — ACETAMINOPHEN 10 MG/ML IV SOLN
1000.0000 mg | Freq: Once | INTRAVENOUS | Status: AC
  Administered 2017-05-07: 1000 mg via INTRAVENOUS

## 2017-05-07 MED ORDER — SODIUM CHLORIDE 0.9 % IV SOLN
INTRAVENOUS | Status: DC
  Administered 2017-05-07: 15:00:00 via INTRAVENOUS

## 2017-05-07 MED ORDER — LIDOCAINE HCL (PF) 2 % IJ SOLN
INTRAMUSCULAR | Status: AC
  Filled 2017-05-07: qty 2

## 2017-05-07 MED ORDER — SUGAMMADEX SODIUM 500 MG/5ML IV SOLN
INTRAVENOUS | Status: DC | PRN
  Administered 2017-05-07: 300 mg via INTRAVENOUS

## 2017-05-07 MED ORDER — ACETAMINOPHEN 10 MG/ML IV SOLN
INTRAVENOUS | Status: AC
  Filled 2017-05-07: qty 100

## 2017-05-07 MED ORDER — SUGAMMADEX SODIUM 500 MG/5ML IV SOLN
INTRAVENOUS | Status: AC
  Filled 2017-05-07: qty 5

## 2017-05-07 MED ORDER — ONDANSETRON HCL 4 MG/2ML IJ SOLN
INTRAMUSCULAR | Status: AC
  Filled 2017-05-07: qty 2

## 2017-05-07 MED ORDER — HALOPERIDOL LACTATE 5 MG/ML IJ SOLN: 5.0000 mg | Freq: Once | INTRAMUSCULAR | Status: DC

## 2017-05-07 MED ORDER — FENTANYL CITRATE (PF) 100 MCG/2ML IJ SOLN
25.0000 ug | INTRAMUSCULAR | Status: DC | PRN
  Administered 2017-05-07 (×2): 25 ug via INTRAVENOUS

## 2017-05-07 SURGICAL SUPPLY — 27 items
ADAPTER IRRIG TUBE 2 SPIKE SOL (ADAPTER) ×8 IMPLANT
BAG DRAIN CYSTO-URO LG1000N (MISCELLANEOUS) ×4 IMPLANT
BAG URO DRAIN 4000ML (MISCELLANEOUS) ×4 IMPLANT
BASKET ZERO TIP 1.9FR (BASKET) ×4 IMPLANT
CATH FOL 2WAY LX 24X30 (CATHETERS) IMPLANT
CATH FOL 3WAY LX COUV 24X75 (CATHETERS) ×4 IMPLANT
CATH FOL LEG HOLDER (MISCELLANEOUS) ×4 IMPLANT
DRAPE UTILITY 15X26 TOWEL STRL (DRAPES) ×4 IMPLANT
ELECT LOOP 22F BIPOLAR SML (ELECTROSURGICAL)
ELECTRODE LOOP 22F BIPOLAR SML (ELECTROSURGICAL) IMPLANT
FIBER LASER LITHO 273 (Laser) ×4 IMPLANT
GLOVE BIO SURGEON STRL SZ 6.5 (GLOVE) ×3 IMPLANT
GLOVE BIO SURGEONS STRL SZ 6.5 (GLOVE) ×1
GOWN STRL REUS W/ TWL LRG LVL3 (GOWN DISPOSABLE) ×4 IMPLANT
GOWN STRL REUS W/TWL LRG LVL3 (GOWN DISPOSABLE) ×4
HOLDER FOLEY CATH W/STRAP (MISCELLANEOUS) ×4 IMPLANT
KIT RM TURNOVER CYSTO AR (KITS) ×4 IMPLANT
LOOP CUT BIPOLAR 24F LRG (ELECTROSURGICAL) ×4 IMPLANT
PACK CYSTO AR (MISCELLANEOUS) ×4 IMPLANT
SET IRRIG Y TYPE TUR BLADDER L (SET/KITS/TRAYS/PACK) ×4 IMPLANT
SET IRRIGATING DISP (SET/KITS/TRAYS/PACK) ×4 IMPLANT
SOL .9 NS 3000ML IRR  AL (IV SOLUTION) ×8
SOL .9 NS 3000ML IRR UROMATIC (IV SOLUTION) ×8 IMPLANT
STENT URET 6FRX26 CONTOUR (STENTS) ×4 IMPLANT
SYR TOOMEY 50ML (SYRINGE) IMPLANT
SYRINGE IRR TOOMEY STRL 70CC (SYRINGE) ×4 IMPLANT
WATER STERILE IRR 1000ML POUR (IV SOLUTION) ×4 IMPLANT

## 2017-05-07 NOTE — Consult Note (Signed)
Urology Consult  I have been asked to see the patient by Dr. Amado Coe Dr. Sampson Goon, for evaluation and management of prostate abscess.  Chief Complaint: Dysuria, fevers  History of Present Illness: Phillip Osborne is a 83 y.o. year old admitted yesterday with weakness, fevers, dysuria, and urinary incontinence in the setting of chronic urinary tract infection.   He reports that in early July, he began experiencing burning with urination, urgency, and urge incontinence.  This has failed failed to improve despite multiple rounds of antibiotics.  He also reports pain in his lower pelvis and back.  He does have a personal history of recurrent ESBL Escherichia coli UTIs.  In fact, he was started on IV ertapenem on 05/03/2017. Despite this, his symptoms failed to improve.  He was recently seen and evaluated in urology clinic on 04/22/2017 which time his urinalysis was fairly unremarkable. He was scheduled for a noncontrast CT stone protocol to rule out stones as a contributing factor given his personal history of stones.   On admission here, he did undergo a noncontrast CT scan revealing a large prostatic abscess.  This measured 6 x 2 x 5.1 x 7 cm the mass centrally located within the prostate indenting into the base of the bladder.  They also note a 2 mm left distal UVJ stone without hydronephrosis.  During this admission, he did spike a fever to 103. He is otherwise hemodynamically stable. He is currently on ertapenem, he's been seen by Dr. Sampson Goon of infectious disease. Vancomycin was stopped.    Past Medical History:  Diagnosis Date  . Diabetes mellitus without complication (HCC)   . Gout   . Hypertension   . Kidney stone   . Seizures (HCC)    last seizure about 1 year ago(2017)  . Thyroid disease     Past Surgical History:  Procedure Laterality Date  . EXTRACORPOREAL SHOCK WAVE LITHOTRIPSY Left 02/22/2016   Procedure: EXTRACORPOREAL SHOCK WAVE LITHOTRIPSY (ESWL);  Surgeon:  Hildred Laser, MD;  Location: [facility] ORS;  Service: Urology;  Laterality: Left;  . EYE SURGERY Left    cataract surgery  . HERNIA REPAIR  70's    Home Medications:  Current Meds  Medication Sig  . atorvastatin (LIPITOR) 20 MG tablet Take 20 mg by mouth at bedtime.   . donepezil (ARICEPT) 5 MG tablet Take 5 mg by mouth at bedtime.  . ertapenem 1 g in sodium chloride 0.9 % 50 mL Inject 1 g into the vein daily.  . febuxostat (ULORIC) 40 MG tablet Take 40 mg by mouth daily.  . ferrous sulfate 325 (65 FE) MG tablet Take 325 mg by mouth daily.  . folic acid (FOLVITE) 1 MG tablet Take 1 mg by mouth daily.  Marland Kitchen gabapentin (NEURONTIN) 300 MG capsule Take 300 mg by mouth 3 (three) times daily.   Marland Kitchen glucose blood (ACCU-CHEK AVIVA) test strip 1 each by Other route 3 (three) times daily. (Fasting and 2 hours after largest meal)  . HYDROcodone-acetaminophen (NORCO/VICODIN) 5-325 MG tablet Take 1 tablet by mouth every 6 (six) hours as needed for moderate pain.  Marland Kitchen insulin glargine (LANTUS) 100 UNIT/ML injection Inject 50 Units into the skin daily.   . insulin lispro (HUMALOG) 100 UNIT/ML injection Inject 5 Units into the skin 3 (three) times daily before meals. (Hold if CBG is less than 100)  . lamoTRIgine (LAMICTAL) 25 MG tablet Take 1 tablet (25 mg total) by mouth daily. (Patient taking differently: Take 50 mg by  mouth 2 (two) times daily. )  . levETIRAcetam (KEPPRA) 500 MG tablet Take 500 mg by mouth 2 (two) times daily.   Marland Kitchen. levothyroxine (SYNTHROID, LEVOTHROID) 75 MCG tablet Take 75 mcg by mouth daily.   . patiromer (VELTASSA) 8.4 g packet Take 1 packet (8.4 g total) by mouth daily.  . sodium bicarbonate 650 MG tablet Take 1,300 mg by mouth 3 (three) times daily.     Allergies:  Allergies  Allergen Reactions  . Enalapril Swelling    Face swelling.  . Lisinopril Swelling    mouth    Family History  Problem Relation Age of Onset  . Cancer Mother   . Diabetes Sister   . Kidney disease Neg Hx    . Prostate cancer Neg Hx   . Kidney cancer Neg Hx   . Bladder Cancer Neg Hx     Social History:  reports that he has never smoked. He has never used smokeless tobacco. He reports that he does not drink alcohol or use drugs.  ROS: A complete review of systems was performed.  All systems are negative except for pertinent findings as noted.  Physical Exam:  Vital signs in last 24 hours: Temp:  [98.6 F (37 C)-103.1 F (39.5 C)] 98.6 F (37 C) (07/31 2333) Pulse Rate:  [78-96] 92 (07/31 2333) Resp:  [19-20] 19 (07/31 2333) BP: (115-131)/(59-69) 126/69 (07/31 2333) SpO2:  [96 %-100 %] 100 % (07/31 2333) Constitutional:  Alert and oriented, No acute distress HEENT: [place] AT, moist mucus membranes.  Trachea midline, no masses Cardiovascular: Regular rate and rhythm, no clubbing, cyanosis, or edema. Respiratory: Normal respiratory effort, lungs clear bilaterally GI: Abdomen is soft, nontender, nondistended, no abdominal masses.  Mild suprapubic tenderness. GU: Mildly tender scrotum without discrete masses or lesions. Bilateral descended testicles without masses.  Uncircumcised phallus. Wearing saturated diaper. Skin: No rashes, bruises or suspicious lesions Neurologic: Grossly intact, no focal deficits, moving all 4 extremities Psychiatric: Normal mood and affect   Laboratory Data:   Recent Labs  05/05/17 2248 05/07/17 0311  WBC 8.4 9.2  HGB 9.5* 9.5*  HCT 28.3* 27.9*    Recent Labs  05/05/17 2248 05/07/17 0311  NA 135 141  K 4.5 4.3  CL 100* 106  CO2 26 26  GLUCOSE 127* 71  BUN 31* 27*  CREATININE 2.46* 2.13*  CALCIUM 9.2 9.2    Recent Labs  05/07/17 0311  INR 1.13   No results for input(s): LABURIN in the last 72 hours. Results for orders placed or performed during the hospital encounter of 05/05/17  CULTURE, BLOOD (ROUTINE X 2) w Reflex to ID Panel     Status: None (Preliminary result)   Collection Time: 05/06/17 10:00 AM  Result Value Ref Range Status    Specimen Description BLOOD RIGHT HAND  Final   Special Requests   Final    BOTTLES DRAWN AEROBIC AND ANAEROBIC Blood Culture results may not be optimal due to an inadequate volume of blood received in culture bottles   Culture NO GROWTH < 24 HOURS  Final   Report Status PENDING  Incomplete  CULTURE, BLOOD (ROUTINE X 2) w Reflex to ID Panel     Status: None (Preliminary result)   Collection Time: 05/06/17 10:08 AM  Result Value Ref Range Status   Specimen Description BLOOD RIGHT WRIST  Final   Special Requests   Final    BOTTLES DRAWN AEROBIC AND ANAEROBIC Blood Culture adequate volume   Culture NO GROWTH < 24  HOURS  Final   Report Status PENDING  Incomplete     Radiologic Imaging: Dg Chest 1 View  Result Date: 05/06/2017 CLINICAL DATA:  83 y/o  M; PICC line placement. EXAM: CHEST 1 VIEW COMPARISON:  05/02/2017 chest radiograph FINDINGS: Stable cardiac silhouette. Left PICC line tip projects over the mid SVC unchanged from prior radiograph. Linear opacity left lung base is likely minor atelectasis. No consolidation, effusion, or pneumothorax. Bones are unremarkable. IMPRESSION: Left PICC line tip projects over mid SVC. Electronically Signed   By: Mitzi HansenLance  Furusawa-Stratton M.D.   On: 05/06/2017 01:23   Ct Head Wo Contrast  Result Date: 05/06/2017 CLINICAL DATA:  83 year old male with inability to ambulate and weakness. EXAM: CT HEAD WITHOUT CONTRAST TECHNIQUE: Contiguous axial images were obtained from the base of the skull through the vertex without intravenous contrast. COMPARISON:  Head CT dated 05/04/2016 FINDINGS: Brain: There is moderate age-related atrophy and chronic microvascular ischemic changes. There is no acute intracranial hemorrhage. No mass effect or midline shift noted. No intra-axial fluid collection. Vascular: No hyperdense vessel or unexpected calcification. Skull: Normal. Negative for fracture or focal lesion. Sinuses/Orbits: There is mild mucoperiosteal thickening of  paranasal sinuses with partial opacification of multiple ethmoid air cells. There is a small air-fluid level in the left sphenoid sinus. The mastoid air cells are clear. Other: Small metallic object in the soft tissues of the posterior upper neck at the level of the foramen magnum. IMPRESSION: 1. No acute intracranial hemorrhage. 2. Moderate age-related atrophy and chronic microvascular ischemic changes. If symptoms persist, and there are no contraindications, MRI may provide better evaluation if clinically indicated. 3. Mild paranasal sinus disease. Electronically Signed   By: Elgie CollardArash  Radparvar M.D.   On: 05/06/2017 03:52   Dg Chest Port 1 View  Result Date: 05/06/2017 CLINICAL DATA:  Sepsis EXAM: PORTABLE CHEST 1 VIEW COMPARISON:  05/06/2017 FINDINGS: Stable left PICC. Normal heart size. Lungs under aerated with bibasilar atelectasis. No pneumothorax. IMPRESSION: Bibasilar atelectasis. Electronically Signed   By: Jolaine ClickArthur  Hoss M.D.   On: 05/06/2017 16:37   Ct Renal Stone Study  Result Date: 05/06/2017 CLINICAL DATA:  Nausea, generalized weakness, fever, possibly UTI EXAM: CT ABDOMEN AND PELVIS WITHOUT CONTRAST TECHNIQUE: Multidetector CT imaging of the abdomen and pelvis was performed following the standard protocol without IV contrast. COMPARISON:  11/24/2016 FINDINGS: Motion degraded images. Lower chest: Mild dependent atelectasis at the lung bases. Hepatobiliary: Unenhanced liver is unremarkable. Layering gallstones (series 2/ image 30), without associated inflammatory changes. Pancreas: Within normal limits. Spleen: Within normal limits. Adrenals/Urinary Tract: Adrenal glands are within normal limits. 3.0 cm cyst along the posterior right upper kidney (series 2/ image 39). Punctate nonobstructing right upper pole renal calculus (series 2/ image 33). 2.5 cm posterior left upper pole renal cyst (series 2/ image 33). No hydronephrosis. Suspected 2 mm distal left ureteral calculus at the UVJ (series 2/image  32), similar to the prior. Bladder is mildly thick-walled although underdistended. Stomach/Bowel: Stomach is notable for a posterior gastric diverticulum and a tiny hiatal hernia. No evidence of bowel obstruction. Appendix is not discretely visualized. Scattered right colonic diverticulosis, without evidence of diverticulitis. Vascular/Lymphatic: No evidence of abdominal aortic aneurysm. Mild atherosclerotic calcifications of the abdominal aorta and branch vessels. No suspicious abdominopelvic lymphadenopathy. Reproductive: Abnormal, enlarged, heterogeneous appearance of the prostate, new from the prior. Low-density appearance of the right peripheral gland (series 2/ image 83), with associated 6.2 x 5.1 x 7.0 cm low-density/fluid density lesion centrally within the prostate (series 2/image  77), indenting the base of the bladder. This appearance favors prostatitis with a periprostatic abscess. Other: No abdominopelvic ascites. Musculoskeletal: Mild degenerative changes of the lumbar spine. IMPRESSION: Motion degraded images. Prostatitis with a suspected 7.0 cm periprostatic abscess, as above. 2 mm distal left ureteral calculus at the UVJ, similar to the prior. Additional punctate nonobstructing right upper pole renal calculus. No hydronephrosis. Bladder is mildly thick-walled although underdistended. Additional ancillary findings as above. These results will be called to the ordering clinician or representative by the Radiologist Assistant, and communication documented in the PACS or zVision Dashboard. Electronically Signed   By: Charline Bills M.D.   On: 05/06/2017 20:10   CT scan was personally reviewed. This is also compared to previous CT scan in 11/2016.  Impression/Plan:  1. Prostatic abscess- large central prostatic abscess measuring 7 cm on CT scan, likely underlying cause of symptoms including dysuria, incontinence, pelvic and back pain as well as fevers and inability to clear her urinary tract  infection.  I have recommended proceeding to the operating room today for transurethral unroofing of the prostatic abscess given its favorable central location. Alternatives including percutaneous intervention were also discussed. Risks and benefits of the procedure were discussed at length today including risk of bleeding, acute worsening of infection, damage to surrounding structures, incontinence, and failure of the procedure. All questions were answered. He also discussed the case with his daughter by telephone today, Phillip Osborne.    NPO.  Consent obtained.    2. ESBL E. Coli UTI- recurrent ESBL Escherichia coli despite fairly bland-appearing urines and IV antibiotics. Recommendations for post procedural duration antibiotics per ID, Dr. Sampson Goon.   3.  Possible left UVJ stone- calcifications seen chronically at the left UVJ, unclear whether this represents a stone, intramural stone in wall of ureter, or vascular lesion. This is seen on previous scans as well without evidence of hydronephrosis. Consider bilateral retrogrades, ureteroscopy of the left distal ureter if anatomy is amenable to this at the time of the above procedure.    05/07/2017, 7:47 AM  Vanna Scotland,  MD

## 2017-05-07 NOTE — Transfer of Care (Signed)
Immediate Anesthesia Transfer of Care Note  Patient: Phillip Osborne  Procedure(s) Performed: Procedure(s): TRANSURETHRAL RESECTION OF THE PROSTATE (TURP) (N/A) CYSTOSCOPY/URETEROSCOPY/HOLMIUM LASER/STENT PLACEMENT (Left)  Patient Location: PACU  Anesthesia Type:General  Level of Consciousness: sedated  Airway & Oxygen Therapy: Patient Spontanous Breathing and Patient connected to face mask oxygen  Post-op Assessment: Report given to RN and Post -op Vital signs reviewed and stable  Post vital signs: Reviewed and stable  Last Vitals:  Vitals:   05/07/17 1145 05/07/17 1422  BP: 120/72 (!) 163/91  Pulse: 79 99  Resp: 16 13  Temp: 37.4 C 36.7 C    Complications: No apparent anesthesia complications

## 2017-05-07 NOTE — Op Note (Signed)
Date of procedure: 05/07/17  Preoperative diagnosis:  1. Prostatic abscess 2. Left UVJ stone 3. ESBL Escherichia coli UTI  Postoperative diagnosis:  1. Same as above   Procedure: 1. Transurethral resection of the prostate/prostatic abscess unroofing 2. Left ureteroscopy with laser lithotripsy 3. Basket extraction of left ureteral stone fragment 4. Left ureteral stent placement  Surgeon: Vanna ScotlandAshley Mikisha Roseland, MD  Anesthesia: General  Complications: None  Intraoperative findings: Large prostatic abscess with copious purulent material drained after unroofing, abscess cavity measuring approximately 7 cm with mass effect undermining bladder neck.  2 mm left UVJ stone identified and treated. Stent placed.  EBL: Minimal  Specimens: Prostate chips  Drains: 24 French three-way hematuria catheter converted into council tip, 40 cc in the balloon  Indication: Richardson LandryCurtis R Bogosian is a 83 y.o. patient with with recurrent ESBL Escherichia coli urinary tract infections, suprapubic pain, dysuria, incontinence found have a large 7 cm prostatic abscess. He is as an incidental nonobstructing left 2 mm UVJ stone.  After reviewing the management options for treatment, he elected to proceed with the above surgical procedure(s). We have discussed the potential benefits and risks of the procedure, side effects of the proposed treatment, the likelihood of the patient achieving the goals of the procedure, and any potential problems that might occur during the procedure or recuperation. Informed consent has been obtained.  Description of procedure:  The patient was taken to the operating room and general anesthesia was induced.  The patient was placed in the dorsal lithotomy position, prepped and draped in the usual sterile fashion, and preoperative antibiotics were administered. A preoperative time-out was performed.   A 26 French resectoscope was advanced per urethra into the bladder. Upon entry into the urethra,  a mild bulbar urethral stricture was noted and was able to be traversed with some slight trauma under direct visualization. Of note, the prostate appeared quite large with coapting lobes in a significantly elevated bladder neck. There is bullous edema at the edge of the bladder neck somewhat distorting the trigone. Initially, the UOs were unable to be identified. Based on the review the CT scan, it appeared that the abscess cavity was located posteriorly and pushing up into the bladder neck. At this point time, a bipolar resectoscope using saline as a medium was used to make some incisions in the midline.  Initially, some bladder neck fibers were encountered and there is a bulging like appearance which likely represented the pseudocapsule of the abscess cavity. Upon resecting this, a copious amount of purulent material was encountered. The resection was carried down inferiorly down towards the inferior but not beyond this in order to widely marsupialize the abscess cavity to the urethra. The scope was able to be introduced into the cavity and some adherent. The material was swept away from the edge of the pseudocapsule. The beak of the scope was used to express purulent material from the lateral lobes as well. Once all of the purulent material was drained and the edges of the cavity were fulgurated for the purpose of hemostasis, I reintroduced the scope back into the bladder. Of note, the abscess cavity did push upwards underneath the bladder slightly undermining it. Upon reinspection of the bladder, the trigone was significantly less distorted and bilateral ureteral orifices were able to be identified. Attention was then turned to the left ureteral orifice. Using the laser bridge, a sensor wire was able to be introduced up to level of the kidney under fluoroscopic guidance. This is tentatively) safety wire. A  semirigid 4.5 French ureteroscope was then advanced through the distal ureter using a railroad technique  with a second wire. A 2 mm stone was encountered. It had a somewhat of a jagged edge and did not appear to be able to be basketed out easily. As such, 270  laser fiber was then brought in and using settings of 0.8 J and 10 Hz, the stone was fragmented into approximately 3 or 4 smaller pieces. Each of these were then extracted using a 1.9 [place]French to plus nitinol basket. Once all stone burden was removed, a stent was placed by back loading the wire over a rigid cystoscope. The stent was advanced up to level of the kidney. The wire was partially drawn until full coil was noted at the level of the renal pelvis. The wire was inflated to full coil was noted within the bladder. The bladder was then drained. The resectoscope was then reintroduced and additional hemostasis was achieved. There were some small clots that are formed in the bladder during the time of ureteroscopy which were evacuated. In addition, prostate chips were also evacuated. After adequate hemostasis had been achieved and there was no residual purulent material, the scope was removed. Prior to doing this, a sensor wire was advanced into the bladder under direct visualization. Given the large posterior cavity, I did have some concerns about blindly placing a catheter. As such, I fashioned a 24 [place] three-way Foley catheter into a council tip type catheter by cutting off the edge and rounding it. I then advanced over the wire into the bladder. There is initially some resistance but ultimately I was able to advance into the bladder.  The balloon was instilled with 40 cc of sterile water. Slow drip CBI was initiated. The patient was then cleaned and dried, repositioned supine position, reversed from anesthesia, taken to PACU in stable condition.  Plan: Patient will remain on CBI overnight. We'll plan on removing his stent in 1-2 weeks. We'll likely exchange the catheter tomorrow over a wire for a smaller catheter which will remain in place upon  discharge. Like him to keep the catheter for 1-2 weeks postop to allow for urethral healing as well as avoid trapping of urine into the large abscess cavity. Intraoperative findings and the plan were discussed with the patient's family members were available immediately following the procedure.  Vanna ScotlandAshley Kashawn Dirr, M.D.

## 2017-05-07 NOTE — Anesthesia Preprocedure Evaluation (Signed)
Anesthesia Evaluation  Patient identified by MRN, date of birth, ID band Patient awake    Reviewed: Allergy & Precautions, H&P , NPO status , Patient's Chart, lab work & pertinent test results  History of Anesthesia Complications Negative for: history of anesthetic complications  Airway Mallampati: III  TM Distance: >3 FB Neck ROM: limited    Dental  (+) Poor Dentition   Pulmonary neg pulmonary ROS, neg shortness of breath,           Cardiovascular Exercise Tolerance: Good hypertension, (-) angina(-) Past MI and (-) DOE      Neuro/Psych Seizures -, Well Controlled,  negative psych ROS   GI/Hepatic negative GI ROS, Neg liver ROS,   Endo/Other  diabetes, Type 2  Renal/GU Renal disease     Musculoskeletal   Abdominal   Peds  Hematology negative hematology ROS (+)   Anesthesia Other Findings Concern for sepsis  Past Medical History: No date: Diabetes mellitus without complication (HCC) No date: Gout No date: Hypertension No date: Kidney stone No date: Seizures (HCC)     Comment:  last seizure about 1 year ago(2017) No date: Thyroid disease  Past Surgical History: 02/22/2016: EXTRACORPOREAL SHOCK WAVE LITHOTRIPSY; Left     Comment:  Procedure: EXTRACORPOREAL SHOCK WAVE LITHOTRIPSY (ESWL);              Surgeon: Hildred LaserBrian James Budzyn, MD;  Location: [facility] ORS;                Service: Urology;  Laterality: Left; No date: EYE SURGERY; Left     Comment:  cataract surgery 70's: HERNIA REPAIR  BMI    Body Mass Index:  27.79 kg/m      Reproductive/Obstetrics negative OB ROS                             Anesthesia Physical Anesthesia Plan  ASA: IV  Anesthesia Plan: General ETT   Post-op Pain Management:    Induction: Intravenous  PONV Risk Score and Plan: 2 and Ondansetron, Dexamethasone and Treatment may vary due to age or medical condition  Airway Management Planned: Oral  ETT  Additional Equipment:   Intra-op Plan:   Post-operative Plan: Extubation in OR and Possible Post-op intubation/ventilation  Informed Consent: I have reviewed the patients History and Physical, chart, labs and discussed the procedure including the risks, benefits and alternatives for the proposed anesthesia with the patient or authorized representative who has indicated his/her understanding and acceptance.   Dental Advisory Given  Plan Discussed with: Anesthesiologist, CRNA and Surgeon  Anesthesia Plan Comments: (Family and patient consented  Patient informed that they are higher risk for complications from anesthesia during this procedure due to their medical history.  Patient voiced understanding.  Patient consented for risks of anesthesia including but not limited to:  - adverse reactions to medications - damage to teeth, lips or other oral mucosa - sore throat or hoarseness - Damage to heart, brain, lungs or loss of life  Patient voiced understanding.)        Anesthesia Quick Evaluation

## 2017-05-07 NOTE — Progress Notes (Signed)
[facility]  Patient Status: Active  [facility] is providing the following services: SN & IV ABX (Invanz 1g q daily until 8/10)  If patient discharges after hours, please call [phone].   Dimple CaseyJason E Hinton 05/07/2017, 2:44 PM

## 2017-05-07 NOTE — Anesthesia Post-op Follow-up Note (Cosign Needed)
Anesthesia QCDR form completed.        

## 2017-05-07 NOTE — Progress Notes (Signed)
Inpatient Diabetes Program Recommendations  AACE/ADA: New Consensus Statement on Inpatient Glycemic Control (2015)  Target Ranges:  Prepandial:   less than 140 mg/dL      Peak postprandial:   less than 180 mg/dL (1-2 hours)      Critically ill patients:  140 - 180 mg/dL   Lab Results  Component Value Date   GLUCAP 76 05/07/2017   HGBA1C 7.6 (H) 05/05/2017    Review of Glycemic Control:  Results for Phillip Osborne, Phillip R (MRN [id number]) as of 05/07/2017 12:22  Ref. Range 05/06/2017 11:34 05/06/2017 15:50 05/06/2017 18:16 05/06/2017 21:22 05/07/2017 08:09  Glucose-Capillary Latest Ref Range: 65 - 99 mg/dL 98 086130 (H) 578137 (H) 469148 (H) 76    Diabetes history: Diabetes mellitus Outpatient Diabetes medications: Lantus 50 units daily, Humalog 5 units tid with meals Current orders for Inpatient glycemic control:  Novolog sensitive tid with meals, Lantus 30 units daily  Inpatient Diabetes Program Recommendations:    Please consider further reduction of Lantus to 22 units daily.   Thanks, Beryl MeagerJenny Khi Mcmillen, RN, BC-ADM Inpatient Diabetes Coordinator Pager [phone] (8a-5p)

## 2017-05-07 NOTE — Progress Notes (Signed)
Order received for clear liquid diet, advance as tolerated to carb/heart healthy

## 2017-05-07 NOTE — Progress Notes (Signed)
[facility] - [place] at [facility]   PATIENT NAME: Quanell Loughney    MR#:  [id number]  DATE OF BIRTH:  18-Nov-1931  SUBJECTIVE:  CHIEF COMPLAINT:  Patient is reporting nausea . Afebrile overnight. Has baseline dementia  REVIEW OF SYSTEMS:  CONSTITUTIONAL: No fever, fatigue or weakness.  EYES: No blurred or double vision.  EARS, NOSE, AND THROAT: No tinnitus or ear pain.  RESPIRATORY: No cough, shortness of breath, wheezing or hemoptysis.  CARDIOVASCULAR: No chest pain, orthopnea, edema.  GASTROINTESTINAL: Reports nausea, denies vomiting, diarrhea or abdominal pain.  GENITOURINARY: No dysuria, hematuria.  ENDOCRINE: No polyuria, nocturia,  HEMATOLOGY: No anemia, easy bruising or bleeding SKIN: No rash or lesion. MUSCULOSKELETAL: No joint pain or arthritis.   NEUROLOGIC: No tingling, numbness, weakness.  PSYCHIATRY: No anxiety or depression.   DRUG ALLERGIES:   Allergies  Allergen Reactions  . Enalapril Swelling    Face swelling.  . Lisinopril Swelling    mouth    VITALS:  Blood pressure 116/66, pulse 79, temperature 97.9 F (36.6 C), temperature source Oral, resp. rate 19, height 5\' 5"  (1.651 m), weight 76.1 kg (167 lb 11.2 oz), SpO2 98 %.  PHYSICAL EXAMINATION:  GENERAL:  83 y.o.-year-old patient lying in the bed with no acute distress.  EYES: Pupils equal, round, reactive to light and accommodation. No scleral icterus. Extraocular muscles intact.  HEENT: Head atraumatic, normocephalic. Oropharynx and nasopharynx clear.  NECK:  Supple, no jugular venous distention. No thyroid enlargement, no tenderness.  LUNGS: Normal breath sounds bilaterally, no wheezing, rales,rhonchi or crepitation. No use of accessory muscles of respiration.  CARDIOVASCULAR: S1, S2 normal. No murmurs, rubs, or gallops.  ABDOMEN: Soft, nontender, nondistended. Bowel sounds present. No organomegaly or mass.  EXTREMITIES: No pedal edema, cyanosis, or clubbing.  NEUROLOGIC:  Cranial nerves are grossly intact. Muscle strength 5/5 in all extremities. Sensation intact. Gait not checked.  PSYCHIATRIC: The patient is alert and oriented x2- 3.  SKIN: No obvious rash, lesion, or ulcer.    LABORATORY PANEL:   CBC  Recent Labs Lab 05/07/17 0311  WBC 9.2  HGB 9.5*  HCT 27.9*  PLT 237   ------------------------------------------------------------------------------------------------------------------  Chemistries   Recent Labs Lab 05/05/17 2248 05/07/17 0311  NA 135 141  K 4.5 4.3  CL 100* 106  CO2 26 26  GLUCOSE 127* 71  BUN 31* 27*  CREATININE 2.46* 2.13*  CALCIUM 9.2 9.2  AST 16  --   ALT 12*  --   ALKPHOS 67  --   BILITOT 0.6  --    ------------------------------------------------------------------------------------------------------------------  Cardiac Enzymes No results for input(s): TROPONINI in the last 168 hours. ------------------------------------------------------------------------------------------------------------------  RADIOLOGY:  Dg Chest 1 View  Result Date: 05/06/2017 CLINICAL DATA:  83 y/o  M; PICC line placement. EXAM: CHEST 1 VIEW COMPARISON:  05/02/2017 chest radiograph FINDINGS: Stable cardiac silhouette. Left PICC line tip projects over the mid SVC unchanged from prior radiograph. Linear opacity left lung base is likely minor atelectasis. No consolidation, effusion, or pneumothorax. Bones are unremarkable. IMPRESSION: Left PICC line tip projects over mid SVC. Electronically Signed   By: Mitzi Hansen M.D.   On: 05/06/2017 01:23   Ct Head Wo Contrast  Result Date: 05/06/2017 CLINICAL DATA:  83 year old male with inability to ambulate and weakness. EXAM: CT HEAD WITHOUT CONTRAST TECHNIQUE: Contiguous axial images were obtained from the base of the skull through the vertex without intravenous contrast. COMPARISON:  Head CT dated 05/04/2016 FINDINGS: Brain: There is moderate age-related atrophy  and chronic  microvascular ischemic changes. There is no acute intracranial hemorrhage. No mass effect or midline shift noted. No intra-axial fluid collection. Vascular: No hyperdense vessel or unexpected calcification. Skull: Normal. Negative for fracture or focal lesion. Sinuses/Orbits: There is mild mucoperiosteal thickening of paranasal sinuses with partial opacification of multiple ethmoid air cells. There is a small air-fluid level in the left sphenoid sinus. The mastoid air cells are clear. Other: Small metallic object in the soft tissues of the posterior upper neck at the level of the foramen magnum. IMPRESSION: 1. No acute intracranial hemorrhage. 2. Moderate age-related atrophy and chronic microvascular ischemic changes. If symptoms persist, and there are no contraindications, MRI may provide better evaluation if clinically indicated. 3. Mild paranasal sinus disease. Electronically Signed   By: Elgie CollardArash  Radparvar M.D.   On: 05/06/2017 03:52   Dg Chest Port 1 View  Result Date: 05/06/2017 CLINICAL DATA:  Sepsis EXAM: PORTABLE CHEST 1 VIEW COMPARISON:  05/06/2017 FINDINGS: Stable left PICC. Normal heart size. Lungs under aerated with bibasilar atelectasis. No pneumothorax. IMPRESSION: Bibasilar atelectasis. Electronically Signed   By: Jolaine ClickArthur  Hoss M.D.   On: 05/06/2017 16:37   Ct Renal Stone Study  Result Date: 05/06/2017 CLINICAL DATA:  Nausea, generalized weakness, fever, possibly UTI EXAM: CT ABDOMEN AND PELVIS WITHOUT CONTRAST TECHNIQUE: Multidetector CT imaging of the abdomen and pelvis was performed following the standard protocol without IV contrast. COMPARISON:  11/24/2016 FINDINGS: Motion degraded images. Lower chest: Mild dependent atelectasis at the lung bases. Hepatobiliary: Unenhanced liver is unremarkable. Layering gallstones (series 2/ image 30), without associated inflammatory changes. Pancreas: Within normal limits. Spleen: Within normal limits. Adrenals/Urinary Tract: Adrenal glands are within  normal limits. 3.0 cm cyst along the posterior right upper kidney (series 2/ image 39). Punctate nonobstructing right upper pole renal calculus (series 2/ image 33). 2.5 cm posterior left upper pole renal cyst (series 2/ image 33). No hydronephrosis. Suspected 2 mm distal left ureteral calculus at the UVJ (series 2/image 32), similar to the prior. Bladder is mildly thick-walled although underdistended. Stomach/Bowel: Stomach is notable for a posterior gastric diverticulum and a tiny hiatal hernia. No evidence of bowel obstruction. Appendix is not discretely visualized. Scattered right colonic diverticulosis, without evidence of diverticulitis. Vascular/Lymphatic: No evidence of abdominal aortic aneurysm. Mild atherosclerotic calcifications of the abdominal aorta and branch vessels. No suspicious abdominopelvic lymphadenopathy. Reproductive: Abnormal, enlarged, heterogeneous appearance of the prostate, new from the prior. Low-density appearance of the right peripheral gland (series 2/ image 83), with associated 6.2 x 5.1 x 7.0 cm low-density/fluid density lesion centrally within the prostate (series 2/image 77), indenting the base of the bladder. This appearance favors prostatitis with a periprostatic abscess. Other: No abdominopelvic ascites. Musculoskeletal: Mild degenerative changes of the lumbar spine. IMPRESSION: Motion degraded images. Prostatitis with a suspected 7.0 cm periprostatic abscess, as above. 2 mm distal left ureteral calculus at the UVJ, similar to the prior. Additional punctate nonobstructing right upper pole renal calculus. No hydronephrosis. Bladder is mildly thick-walled although underdistended. Additional ancillary findings as above. These results will be called to the ordering clinician or representative by the Radiologist Assistant, and communication documented in the PACS or zVision Dashboard. Electronically Signed   By: Charline BillsSriyesh  Krishnan M.D.   On: 05/06/2017 20:10    EKG:   Orders  placed or performed during the hospital encounter of 05/05/17  . EKG 12-Lead  . EKG 12-Lead  . ED EKG  . ED EKG    ASSESSMENT AND PLAN:   #Sepsis- with history  all of ESBL Esichia coli on urine culture,still febrile though patient was on  Invanz from 7/28. CT abd  shows prostatitis with large periprostatic abscess. Urology is consulted, scheduled for prostateabscess drainage today afternoon by Dr. Apolinar JunesBrandon Appreciate ID input and recommendations Currently on meropenem discontnued vancomycin Repeat CBC with differential and BMP in a.m.  #Chronic kidney disease stage IV Avoid nephrotoxins and monitor renal function Hydrate with IV fluids  #Hypothyroidism TSH normal continue Synthroid  #Gout-stable continue ULORIC  #Seizure disorder-continue home medication Keppra and Lamictal   DVT prophylaxis with heparin subcutaneous    All the records are reviewed and case discussed with Care Management/Social Workerr. Management plans discussed with the patient, daughter and they are in agreement.  CODE STATUS:   TOTAL TIME TAKING CARE OF THIS PATIENT: 36minutes.   POSSIBLE D/C IN 2-3 DAYS, DEPENDING ON CLINICAL CONDITION.  Note: This dictation was prepared with Dragon dictation along with smaller phrase technology. Any transcriptional errors that result from this process are unintentional.   Ramonita LabGouru, Amenah Tucci M.D on 05/07/2017 at 9:11 AM  Between 7am to 6pm - Pager - [phone] After 6pm go to [email or web address] - password [email or web address] [facility]  [facility]  Office  [phone]  CC: Primary care physician; Marisue IvanLinthavong, Kanhka, MD

## 2017-05-07 NOTE — Progress Notes (Signed)
I reviewed chart and images yesterday evening and discussed with Dr. Sampson GoonFitzgerald. It appears the pt has a prostatic abscess (abscess within the prostate) on CT, not a periprostatic abscess (abscess surrounding the prostate) which may be amendable to transurethral drainage. Pt is tentatively posted for TURP to unroof abscess with Dr. Apolinar JunesBrandon. She will review films this AM.

## 2017-05-07 NOTE — Anesthesia Procedure Notes (Signed)
Procedure Name: Intubation Date/Time: 05/07/2017 12:43 PM Performed by: Doreen Salvage Pre-anesthesia Checklist: Patient identified, Patient being monitored, Timeout performed, Emergency Drugs available and Suction available Patient Re-evaluated:Patient Re-evaluated prior to induction Oxygen Delivery Method: Circle system utilized Preoxygenation: Pre-oxygenation with 100% oxygen Induction Type: IV induction Ventilation: Mask ventilation without difficulty Laryngoscope Size: Mac and 3 Grade View: Grade I Tube type: Oral Tube size: 7.0 mm Number of attempts: 1 Airway Equipment and Method: Stylet Placement Confirmation: ETT inserted through vocal cords under direct vision,  positive ETCO2 and breath sounds checked- equal and bilateral Secured at: 26 cm Tube secured with: Tape Dental Injury: Teeth and Oropharynx as per pre-operative assessment

## 2017-05-07 NOTE — Progress Notes (Signed)
SLP Cancellation Note  Patient Details Name: Phillip Osborne MRN: [id number] DOB: 1932/05/05   Cancelled treatment:       Reason Eval/Treat Not Completed: Patient at procedure or test/unavailable;Patient not medically ready (chart reviewed; pt receiving a procedure today) Urology is following and scheduled for prostateabscess drainage today. Pt will be briefly intubated for procedure per notes. ST services will f/u tomorrow. Recommend continue NPO status w/ frequent oral care for hygiene and stimulation.    Jerilynn SomKatherine Glynis Hunsucker, MS, CCC-SLP Miyoko Hashimi 05/07/2017, 1:26 PM

## 2017-05-08 ENCOUNTER — Encounter: Payer: Self-pay | Admitting: Urology

## 2017-05-08 DIAGNOSIS — R300 Dysuria: Secondary | ICD-10-CM | POA: Diagnosis present

## 2017-05-08 DIAGNOSIS — N202 Calculus of kidney with calculus of ureter: Secondary | ICD-10-CM | POA: Diagnosis present

## 2017-05-08 DIAGNOSIS — E1140 Type 2 diabetes mellitus with diabetic neuropathy, unspecified: Secondary | ICD-10-CM | POA: Diagnosis present

## 2017-05-08 DIAGNOSIS — Z833 Family history of diabetes mellitus: Secondary | ICD-10-CM | POA: Diagnosis not present

## 2017-05-08 DIAGNOSIS — Z79899 Other long term (current) drug therapy: Secondary | ICD-10-CM | POA: Diagnosis not present

## 2017-05-08 DIAGNOSIS — E039 Hypothyroidism, unspecified: Secondary | ICD-10-CM | POA: Diagnosis present

## 2017-05-08 DIAGNOSIS — N412 Abscess of prostate: Secondary | ICD-10-CM | POA: Diagnosis present

## 2017-05-08 DIAGNOSIS — I129 Hypertensive chronic kidney disease with stage 1 through stage 4 chronic kidney disease, or unspecified chronic kidney disease: Secondary | ICD-10-CM | POA: Diagnosis present

## 2017-05-08 DIAGNOSIS — Z888 Allergy status to other drugs, medicaments and biological substances status: Secondary | ICD-10-CM | POA: Diagnosis not present

## 2017-05-08 DIAGNOSIS — N184 Chronic kidney disease, stage 4 (severe): Secondary | ICD-10-CM | POA: Diagnosis present

## 2017-05-08 DIAGNOSIS — E1122 Type 2 diabetes mellitus with diabetic chronic kidney disease: Secondary | ICD-10-CM | POA: Diagnosis present

## 2017-05-08 DIAGNOSIS — M109 Gout, unspecified: Secondary | ICD-10-CM | POA: Diagnosis present

## 2017-05-08 DIAGNOSIS — N390 Urinary tract infection, site not specified: Secondary | ICD-10-CM | POA: Diagnosis present

## 2017-05-08 DIAGNOSIS — Z1612 Extended spectrum beta lactamase (ESBL) resistance: Secondary | ICD-10-CM | POA: Diagnosis present

## 2017-05-08 DIAGNOSIS — G40909 Epilepsy, unspecified, not intractable, without status epilepticus: Secondary | ICD-10-CM | POA: Diagnosis present

## 2017-05-08 DIAGNOSIS — Z794 Long term (current) use of insulin: Secondary | ICD-10-CM | POA: Diagnosis not present

## 2017-05-08 DIAGNOSIS — Z809 Family history of malignant neoplasm, unspecified: Secondary | ICD-10-CM | POA: Diagnosis not present

## 2017-05-08 DIAGNOSIS — E785 Hyperlipidemia, unspecified: Secondary | ICD-10-CM | POA: Diagnosis present

## 2017-05-08 DIAGNOSIS — B9620 Unspecified Escherichia coli [E. coli] as the cause of diseases classified elsewhere: Secondary | ICD-10-CM | POA: Diagnosis present

## 2017-05-08 DIAGNOSIS — Z87442 Personal history of urinary calculi: Secondary | ICD-10-CM | POA: Diagnosis not present

## 2017-05-08 DIAGNOSIS — G8929 Other chronic pain: Secondary | ICD-10-CM | POA: Diagnosis present

## 2017-05-08 DIAGNOSIS — A419 Sepsis, unspecified organism: Secondary | ICD-10-CM | POA: Diagnosis present

## 2017-05-08 LAB — CBC WITH DIFFERENTIAL/PLATELET
BASOS ABS: 0 10*3/uL (ref 0–0.1)
Basophils Relative: 0 %
EOS ABS: 0 10*3/uL (ref 0–0.7)
Eosinophils Relative: 0 %
HCT: 26.2 % — ABNORMAL LOW (ref 40.0–52.0)
HEMOGLOBIN: 8.7 g/dL — AB (ref 13.0–18.0)
LYMPHS ABS: 0.3 10*3/uL — AB (ref 1.0–3.6)
LYMPHS PCT: 2 %
MCH: 28.4 pg (ref 26.0–34.0)
MCHC: 33.1 g/dL (ref 32.0–36.0)
MCV: 85.8 fL (ref 80.0–100.0)
Monocytes Absolute: 0.4 10*3/uL (ref 0.2–1.0)
Monocytes Relative: 3 %
NEUTROS PCT: 95 %
Neutro Abs: 13.3 10*3/uL — ABNORMAL HIGH (ref 1.4–6.5)
Platelets: 212 10*3/uL (ref 150–440)
RBC: 3.06 MIL/uL — AB (ref 4.40–5.90)
RDW: 14.2 % (ref 11.5–14.5)
WBC: 13.9 10*3/uL — AB (ref 3.8–10.6)

## 2017-05-08 LAB — GLUCOSE, CAPILLARY
Glucose-Capillary: 291 mg/dL — ABNORMAL HIGH (ref 65–99)
Glucose-Capillary: 304 mg/dL — ABNORMAL HIGH (ref 65–99)
Glucose-Capillary: 174 mg/dL — ABNORMAL HIGH (ref 65–99)
Glucose-Capillary: 213 mg/dL — ABNORMAL HIGH (ref 65–99)

## 2017-05-08 MED ORDER — SODIUM CHLORIDE 0.9 % IV SOLN
500.0000 mg | Freq: Two times a day (BID) | INTRAVENOUS | Status: DC
  Administered 2017-05-09: 500 mg via INTRAVENOUS
  Filled 2017-05-08 (×2): qty 0.5

## 2017-05-08 NOTE — Anesthesia Postprocedure Evaluation (Signed)
Anesthesia Post Note  Patient: Phillip Osborne  Procedure(s) Performed: Procedure(s) (LRB): TRANSURETHRAL RESECTION OF THE PROSTATE (TURP) (N/A) CYSTOSCOPY/URETEROSCOPY/HOLMIUM LASER/STENT PLACEMENT (Left)  Patient location during evaluation: PACU Anesthesia Type: General Level of consciousness: awake and alert Pain management: pain level controlled Vital Signs Assessment: post-procedure vital signs reviewed and stable Respiratory status: spontaneous breathing, nonlabored ventilation, respiratory function stable and patient connected to nasal cannula oxygen Cardiovascular status: blood pressure returned to baseline and stable Postop Assessment: no signs of nausea or vomiting Anesthetic complications: no     Last Vitals:  Vitals:   05/08/17 0622 05/08/17 1335  BP: 137/65 101/63  Pulse: 71 88  Resp: 18 20  Temp: 36.6 C 37 C    Last Pain:  Vitals:   05/08/17 1335  TempSrc: Oral  PainSc:                  Phillip Osborne

## 2017-05-08 NOTE — Progress Notes (Signed)
Urology Consult Follow Up  Subjective: Doing well.  Back pain improved.  Slow gtt CBI overnight.  No fevers.    Anti-infectives: Anti-infectives    Start     Dose/Rate Route Frequency Ordered Stop   05/06/17 1800  vancomycin (VANCOCIN) IVPB 1000 mg/200 mL premix  Status:  Discontinued     1,000 mg 200 mL/hr over 60 Minutes Intravenous Every 18 hours 05/06/17 1032 05/06/17 2053   05/06/17 1200  meropenem (MERREM) 500 mg in sodium chloride 0.9 % 50 mL IVPB  Status:  Discontinued     500 mg 100 mL/hr over 30 Minutes Intravenous Every 12 hours 05/06/17 0908 05/06/17 0932   05/06/17 1200  meropenem (MERREM) IVPB SOLR 500 mg     500 mg 100 mL/hr over 30 Minutes Intravenous Every 12 hours 05/06/17 0933     05/06/17 1000  vancomycin (VANCOCIN) IVPB 1000 mg/200 mL premix     1,000 mg 200 mL/hr over 60 Minutes Intravenous  Once 05/06/17 0906 05/06/17 1250   05/06/17 0800  ertapenem (INVANZ) 0.5 g in sodium chloride 0.9 % 50 mL IVPB  Status:  Discontinued     500 mg 100 mL/hr over 30 Minutes Intravenous Every 24 hours 05/06/17 0741 05/06/17 0858   05/06/17 0630  ertapenem (INVANZ) 1 g in sodium chloride 0.9 % 50 mL IVPB  Status:  Discontinued     1 g 100 mL/hr over 30 Minutes Intravenous Every 24 hours 05/06/17 0616 05/06/17 0741      Current Facility-Administered Medications  Medication Dose Route Frequency Provider Last Rate Last Dose  . acetaminophen (TYLENOL) tablet 650 mg  650 mg Oral Q6H PRN Arnaldo Nataliamond, Michael S, MD   650 mg at 05/06/17 1539   Or  . acetaminophen (TYLENOL) suppository 650 mg  650 mg Rectal Q6H PRN Arnaldo Nataliamond, Michael S, MD      . atorvastatin (LIPITOR) tablet 20 mg  20 mg Oral QHS Arnaldo Nataliamond, Michael S, MD   20 mg at 05/07/17 2203  . docusate sodium (COLACE) capsule 100 mg  100 mg Oral BID Arnaldo Nataliamond, Michael S, MD   100 mg at 05/08/17 1055  . donepezil (ARICEPT) tablet 5 mg  5 mg Oral QHS Arnaldo Nataliamond, Michael S, MD   5 mg at 05/07/17 2254  . febuxostat (ULORIC) tablet 40 mg  40 mg  Oral Daily Arnaldo Nataliamond, Michael S, MD   40 mg at 05/08/17 1056  . ferrous sulfate tablet 325 mg  325 mg Oral Daily Arnaldo Nataliamond, Michael S, MD   325 mg at 05/08/17 1055  . folic acid (FOLVITE) tablet 1 mg  1 mg Oral Daily Arnaldo Nataliamond, Michael S, MD   1 mg at 05/08/17 1056  . gabapentin (NEURONTIN) capsule 300 mg  300 mg Oral TID Arnaldo Nataliamond, Michael S, MD   300 mg at 05/08/17 1056  . heparin injection 5,000 Units  5,000 Units Subcutaneous Q8H Arnaldo Nataliamond, Michael S, MD   5,000 Units at 05/08/17 [phone]  . HYDROcodone-acetaminophen (NORCO/VICODIN) 5-325 MG per tablet 1 tablet  1 tablet Oral Q6H PRN Arnaldo Nataliamond, Michael S, MD   1 tablet at 05/08/17 [phone]  . insulin aspart (novoLOG) injection 0-9 Units  0-9 Units Subcutaneous TID WC Arnaldo Nataliamond, Michael S, MD   7 Units at 05/08/17 1208  . insulin glargine (LANTUS) injection 30 Units  30 Units Subcutaneous Daily Arnaldo Nataliamond, Michael S, MD   30 Units at 05/08/17 1054  . lamoTRIgine (LAMICTAL) tablet 50 mg  50 mg Oral Daily Gouru, Aruna, MD   50 mg  at 05/08/17 1056  . levETIRAcetam (KEPPRA) tablet 500 mg  500 mg Oral BID Arnaldo Natal, MD   500 mg at 05/08/17 1056  . levothyroxine (SYNTHROID, LEVOTHROID) tablet 75 mcg  75 mcg Oral QAC breakfast Arnaldo Natal, MD   75 mcg at 05/08/17 0844  . meropenem (MERREM) IVPB SOLR 500 mg  500 mg Intravenous Q12H Ramonita Lab, MD   Stopped at 05/08/17 1238  . morphine 2 MG/ML injection 1 mg  1 mg Intravenous Q6H PRN Auburn Bilberry, MD      . ondansetron [facility]) tablet 4 mg  4 mg Oral Q6H PRN Arnaldo Natal, MD       Or  . ondansetron [facility]) injection 4 mg  4 mg Intravenous Q6H PRN Arnaldo Natal, MD   4 mg at 05/06/17 1158  . patiromer Lelon Perla) packet 8.4 g  8.4 g Oral Daily Arnaldo Natal, MD   8.4 g at 05/08/17 1053  . sodium bicarbonate tablet 1,300 mg  1,300 mg Oral TID Arnaldo Natal, MD   1,300 mg at 05/08/17 1056  . sodium chloride flush (NS) 0.9 % injection 10-40 mL  10-40 mL Intracatheter PRN Gouru, Aruna, MD          Objective: Vital signs in last 24 hours: Temp:  [97.6 F (36.4 C)-99.4 F (37.4 C)] 98.6 F (37 C) (08/02 1335) Pulse Rate:  [71-121] 88 (08/02 1335) Resp:  [13-23] 20 (08/02 1335) BP: (101-165)/(63-91) 101/63 (08/02 1335) SpO2:  [96 %-100 %] 98 % (08/02 1335) Weight:  [168 lb 3.2 oz (76.3 kg)] 168 lb 3.2 oz (76.3 kg) (08/02 0500)  Intake/Output from previous day: 08/01 0701 - 08/02 0700 In: 6300 [I.V.:1000] Out: 16109 [Urine:13175] Intake/Output this shift: Total I/O In: 2050 [Other:2000; IV Piggyback:50] Out: 2000 [Urine:2000]   Physical Exam  Constitutional: He is well-developed, well-nourished, and in no distress.  HENT:  Head: Normocephalic and atraumatic.  Cardiovascular: Normal rate and regular rhythm.   Abdominal: Soft. He exhibits no distension.  Genitourinary: Penis normal.  Genitourinary Comments: CBI clear on gtt.   Skin: Skin is warm and dry.  Psychiatric: Affect normal.    Lab Results:   Recent Labs  05/07/17 0311 05/08/17 0517  WBC 9.2 13.9*  HGB 9.5* 8.7*  HCT 27.9* 26.2*  PLT 237 212   BMET  Recent Labs  05/07/17 0311 05/07/17 0928  NA 141 138  K 4.3 4.0  CL 106 105  CO2 26 26  GLUCOSE 71 68  BUN 27* 33*  CREATININE 2.13* 2.06*  CALCIUM 9.2 8.8*   PT/INR  Recent Labs  05/07/17 0311  LABPROT 14.6  INR 1.13    Studies/Results: Dg Chest Port 1 View  Result Date: 05/06/2017 CLINICAL DATA:  Sepsis EXAM: PORTABLE CHEST 1 VIEW COMPARISON:  05/06/2017 FINDINGS: Stable left PICC. Normal heart size. Lungs under aerated with bibasilar atelectasis. No pneumothorax. IMPRESSION: Bibasilar atelectasis. Electronically Signed   By: Jolaine Click M.D.   On: 05/06/2017 16:37   Ct Renal Stone Study  Result Date: 05/06/2017 CLINICAL DATA:  Nausea, generalized weakness, fever, possibly UTI EXAM: CT ABDOMEN AND PELVIS WITHOUT CONTRAST TECHNIQUE: Multidetector CT imaging of the abdomen and pelvis was performed following the standard protocol  without IV contrast. COMPARISON:  11/24/2016 FINDINGS: Motion degraded images. Lower chest: Mild dependent atelectasis at the lung bases. Hepatobiliary: Unenhanced liver is unremarkable. Layering gallstones (series 2/ image 30), without associated inflammatory changes. Pancreas: Within normal limits. Spleen: Within normal limits. Adrenals/Urinary Tract:  Adrenal glands are within normal limits. 3.0 cm cyst along the posterior right upper kidney (series 2/ image 39). Punctate nonobstructing right upper pole renal calculus (series 2/ image 33). 2.5 cm posterior left upper pole renal cyst (series 2/ image 33). No hydronephrosis. Suspected 2 mm distal left ureteral calculus at the UVJ (series 2/image 32), similar to the prior. Bladder is mildly thick-walled although underdistended. Stomach/Bowel: Stomach is notable for a posterior gastric diverticulum and a tiny hiatal hernia. No evidence of bowel obstruction. Appendix is not discretely visualized. Scattered right colonic diverticulosis, without evidence of diverticulitis. Vascular/Lymphatic: No evidence of abdominal aortic aneurysm. Mild atherosclerotic calcifications of the abdominal aorta and branch vessels. No suspicious abdominopelvic lymphadenopathy. Reproductive: Abnormal, enlarged, heterogeneous appearance of the prostate, new from the prior. Low-density appearance of the right peripheral gland (series 2/ image 83), with associated 6.2 x 5.1 x 7.0 cm low-density/fluid density lesion centrally within the prostate (series 2/image 77), indenting the base of the bladder. This appearance favors prostatitis with a periprostatic abscess. Other: No abdominopelvic ascites. Musculoskeletal: Mild degenerative changes of the lumbar spine. IMPRESSION: Motion degraded images. Prostatitis with a suspected 7.0 cm periprostatic abscess, as above. 2 mm distal left ureteral calculus at the UVJ, similar to the prior. Additional punctate nonobstructing right upper pole renal  calculus. No hydronephrosis. Bladder is mildly thick-walled although underdistended. Additional ancillary findings as above. These results will be called to the ordering clinician or representative by the Radiologist Assistant, and communication documented in the PACS or zVision Dashboard. Electronically Signed   By: Charline BillsSriyesh  Krishnan M.D.   On: 05/06/2017 20:10    Assessment/ Plan:  1. Prostatic abscess- large central prostatic abscess measuring 7 cm POD 1 s/p TUR unroofing.  Stop CBI today.  Will try Foley exchange over wire tomorrow for comfort.  2. Left ureteral stone- s/p URS, stent placed yesterday    3. ESBL E. Coli UTI- recurrent ESBL Escherichia coli despite fairly bland-appearing urines and IV antibiotics. Recommendations for post procedural duration antibiotics per ID, Dr. Sampson GoonFitzgerald.      LOS: 0 days    Vanna Scotlandshley Josel Keo 05/08/2017

## 2017-05-08 NOTE — Progress Notes (Signed)
Inpatient Diabetes Program Recommendations  AACE/ADA: New Consensus Statement on Inpatient Glycemic Control (2015)  Target Ranges:  Prepandial:   less than 140 mg/dL      Peak postprandial:   less than 180 mg/dL (1-2 hours)      Critically ill patients:  140 - 180 mg/dL   Lab Results  Component Value Date   GLUCAP 291 (H) 05/08/2017   HGBA1C 7.6 (H) 05/05/2017    Review of Glycemic Control Results for Phillip Osborne, [name] (MRN [id number]) as of 05/08/2017 10:07  Ref. Range 05/07/2017 14:45 05/07/2017 15:12 05/07/2017 17:25 05/07/2017 21:07 05/08/2017 07:56  Glucose-Capillary Latest Ref Range: 65 - 99 mg/dL 130256 (H) 865247 (H) 784228 (H) 381 (H) 291 (H)   Diabetes history: Diabetes Mellitus Outpatient Diabetes medications: Lantus 50 units daily, Humalog 5 units tid with meals Current orders for Inpatient glycemic control:  Novolog sensitive tid with meals and HS, Lantus 30 units daily  Inpatient Diabetes Program Recommendations:    Note that patient did not receive Lantus on 05/07/17.  Blood sugar was low on 05/07/17 during NPO status.  Patient is now eating.  He is scheduled to get Lantus 30 units today.  Since patient did not get Lantus on 05/07/17, would not increase dose today. Will follow.   Thanks, Beryl MeagerJenny Katti Pelle, RN, BC-ADM Inpatient Diabetes Coordinator Pager [phone] (8a-5p)

## 2017-05-08 NOTE — Progress Notes (Signed)
ID Enote S/p unroofing of prostate abscess. Given prostatic abscess with ESBL E coli will need prolonged IV ertapanem Would plan on 28 day course.

## 2017-05-08 NOTE — Progress Notes (Signed)
Patient sitting up in the chair.  Weak and unsteady.  Order received for PT

## 2017-05-08 NOTE — Progress Notes (Signed)
[facility] [place] [facility]   PATIENT NAME: Phillip Osborne    MR#:  [id number]  DATE OF BIRTH:  05/09/1932  SUBJECTIVE:  Feels very weak and complains of burning with urination but has a Foley in place  REVIEW OF SYSTEMS:  CONSTITUTIONAL: No fever, fatigue or weakness.  EYES: No blurred or double vision.  EARS, NOSE, AND THROAT: No tinnitus or ear pain.  RESPIRATORY: No cough, shortness of breath, wheezing or hemoptysis.  CARDIOVASCULAR: No chest pain, orthopnea, edema.  GASTROINTESTINAL: Reports nausea, denies vomiting, diarrhea or abdominal pain.  GENITOURINARY: Positive dysuria, positive hematuria.  ENDOCRINE: No polyuria, nocturia,  HEMATOLOGY: No anemia, easy bruising or bleeding SKIN: No rash or lesion. MUSCULOSKELETAL: No joint pain or arthritis.   NEUROLOGIC: No tingling, numbness, weakness.  PSYCHIATRY: No anxiety or depression.   DRUG ALLERGIES:   Allergies  Allergen Reactions  . Enalapril Swelling    Face swelling.  . Lisinopril Swelling    mouth    VITALS:  Blood pressure 101/63, pulse 88, temperature 98.6 F (37 C), temperature source Oral, resp. rate 20, height 5\' 5"  (1.651 m), weight 168 lb 3.2 oz (76.3 kg), SpO2 98 %.  PHYSICAL EXAMINATION:  GENERAL:  83 y.o.-year-old patient lying in the bed with no acute distress.  EYES: Pupils equal, round, reactive to light and accommodation. No scleral icterus. Extraocular muscles intact.  HEENT: Head atraumatic, normocephalic. Oropharynx and nasopharynx clear.  NECK:  Supple, no jugular venous distention. No thyroid enlargement, no tenderness.  LUNGS: Normal breath sounds bilaterally, no wheezing, rales,rhonchi or crepitation. No use of accessory muscles of respiration.  CARDIOVASCULAR: S1, S2 normal. No murmurs, rubs, or gallops.  ABDOMEN: Soft, nontender, nondistended. Bowel sounds present. No organomegaly or mass.  EXTREMITIES: No pedal edema, cyanosis, or clubbing.   NEUROLOGIC: Cranial nerves are grossly intact. Muscle strength 5/5 in all extremities. Sensation intact. Gait not checked.  PSYCHIATRIC: The patient is alert and oriented x2- 3.  SKIN: No obvious rash, lesion, or ulcer.    LABORATORY PANEL:   CBC  Recent Labs Lab 05/08/17 0517  WBC 13.9*  HGB 8.7*  HCT 26.2*  PLT 212   ------------------------------------------------------------------------------------------------------------------  Chemistries   Recent Labs Lab 05/07/17 0928  NA 138  K 4.0  CL 105  CO2 26  GLUCOSE 68  BUN 33*  CREATININE 2.06*  CALCIUM 8.8*  AST 20  ALT 15*  ALKPHOS 73  BILITOT 0.9   ------------------------------------------------------------------------------------------------------------------  Cardiac Enzymes No results for input(s): TROPONINI in the last 168 hours. ------------------------------------------------------------------------------------------------------------------  RADIOLOGY:  Dg Chest Port 1 View  Result Date: 05/06/2017 CLINICAL DATA:  Sepsis EXAM: PORTABLE CHEST 1 VIEW COMPARISON:  05/06/2017 FINDINGS: Stable left PICC. Normal heart size. Lungs under aerated with bibasilar atelectasis. No pneumothorax. IMPRESSION: Bibasilar atelectasis. Electronically Signed   By: Jolaine ClickArthur  Hoss M.D.   On: 05/06/2017 16:37   Ct Renal Stone Study  Result Date: 05/06/2017 CLINICAL DATA:  Nausea, generalized weakness, fever, possibly UTI EXAM: CT ABDOMEN AND PELVIS WITHOUT CONTRAST TECHNIQUE: Multidetector CT imaging of the abdomen and pelvis was performed following the standard protocol without IV contrast. COMPARISON:  11/24/2016 FINDINGS: Motion degraded images. Lower chest: Mild dependent atelectasis at the lung bases. Hepatobiliary: Unenhanced liver is unremarkable. Layering gallstones (series 2/ image 30), without associated inflammatory changes. Pancreas: Within normal limits. Spleen: Within normal limits. Adrenals/Urinary Tract: Adrenal  glands are within normal limits. 3.0 cm cyst along the posterior right upper kidney (series 2/ image 39). Punctate nonobstructing  right upper pole renal calculus (series 2/ image 33). 2.5 cm posterior left upper pole renal cyst (series 2/ image 33). No hydronephrosis. Suspected 2 mm distal left ureteral calculus at the UVJ (series 2/image 32), similar to the prior. Bladder is mildly thick-walled although underdistended. Stomach/Bowel: Stomach is notable for a posterior gastric diverticulum and a tiny hiatal hernia. No evidence of bowel obstruction. Appendix is not discretely visualized. Scattered right colonic diverticulosis, without evidence of diverticulitis. Vascular/Lymphatic: No evidence of abdominal aortic aneurysm. Mild atherosclerotic calcifications of the abdominal aorta and branch vessels. No suspicious abdominopelvic lymphadenopathy. Reproductive: Abnormal, enlarged, heterogeneous appearance of the prostate, new from the prior. Low-density appearance of the right peripheral gland (series 2/ image 83), with associated 6.2 x 5.1 x 7.0 cm low-density/fluid density lesion centrally within the prostate (series 2/image 77), indenting the base of the bladder. This appearance favors prostatitis with a periprostatic abscess. Other: No abdominopelvic ascites. Musculoskeletal: Mild degenerative changes of the lumbar spine. IMPRESSION: Motion degraded images. Prostatitis with a suspected 7.0 cm periprostatic abscess, as above. 2 mm distal left ureteral calculus at the UVJ, similar to the prior. Additional punctate nonobstructing right upper pole renal calculus. No hydronephrosis. Bladder is mildly thick-walled although underdistended. Additional ancillary findings as above. These results will be called to the ordering clinician or representative by the Radiologist Assistant, and communication documented in the PACS or zVision Dashboard. Electronically Signed   By: Charline BillsSriyesh  Krishnan M.D.   On: 05/06/2017 20:10     EKG:   Orders placed or performed during the hospital encounter of 05/05/17  . EKG 12-Lead  . EKG 12-Lead  . ED EKG  . ED EKG    ASSESSMENT AND PLAN:   #Sepsis- Due to prostate abscesses Status post transurethral resection of the prostate, left ureteroscopic with laser lithotripsy Appreciate ID input and recommendations Continue meropenem  Repeat CBC with differential and BMP in a.m.  #Chronic kidney disease stage IV Avoid nephrotoxins and monitor renal function Renal function stable  #Hypothyroidism TSH normal continue Synthroid  #Gout-stable continue ULORIC  #Seizure disorder-continue home medication Keppra and Lamictal   DVT prophylaxis with heparin subcutaneous    All the records are reviewed and case discussed with Care Management/Social Workerr. Management plans discussed with the patient, daughter and they are in agreement.  CODE STATUS:   TOTAL TIME TAKING CARE OF THIS PATIENT: 32minutes.   POSSIBLE D/C IN 2-3 DAYS, DEPENDING ON CLINICAL CONDITION.  Note: This dictation was prepared with Dragon dictation along with smaller phrase technology. Any transcriptional errors that result from this process are unintentional.   Auburn BilberryPATEL, Klaira Pesci M.D on 05/08/2017 at 2:19 PM  Between 7am to 6pm - Pager - [phone] After 6pm go to [email or web address] - password [email or web address] [facility]  [facility]  Office  [phone]  CC: Primary care physician; Marisue IvanLinthavong, Kanhka, MD

## 2017-05-09 LAB — CBC WITH DIFFERENTIAL/PLATELET
BASOS ABS: 0.1 10*3/uL (ref 0–0.1)
BASOS PCT: 1 %
Eosinophils Absolute: 0.1 10*3/uL (ref 0–0.7)
Eosinophils Relative: 1 %
HEMATOCRIT: 28.2 % — AB (ref 40.0–52.0)
Hemoglobin: 9.2 g/dL — ABNORMAL LOW (ref 13.0–18.0)
LYMPHS PCT: 11 %
Lymphs Abs: 1.3 10*3/uL (ref 1.0–3.6)
MCH: 28.2 pg (ref 26.0–34.0)
MCHC: 32.8 g/dL (ref 32.0–36.0)
MCV: 86.1 fL (ref 80.0–100.0)
Monocytes Absolute: 1.1 10*3/uL — ABNORMAL HIGH (ref 0.2–1.0)
Monocytes Relative: 9 %
Neutro Abs: 9.5 10*3/uL — ABNORMAL HIGH (ref 1.4–6.5)
Neutrophils Relative %: 78 %
Platelets: 253 10*3/uL (ref 150–440)
RBC: 3.28 MIL/uL — AB (ref 4.40–5.90)
RDW: 14.5 % (ref 11.5–14.5)
WBC: 12.1 10*3/uL — ABNORMAL HIGH (ref 3.8–10.6)

## 2017-05-09 LAB — SURGICAL PATHOLOGY

## 2017-05-09 LAB — BASIC METABOLIC PANEL
Anion gap: 8 (ref 5–15)
BUN: 40 mg/dL — AB (ref 6–20)
CALCIUM: 9 mg/dL (ref 8.9–10.3)
CO2: 25 mmol/L (ref 22–32)
CREATININE: 2.01 mg/dL — AB (ref 0.61–1.24)
Chloride: 107 mmol/L (ref 101–111)
GFR calc Af Amer: 34 mL/min — ABNORMAL LOW (ref >60)
GFR, EST NON AFRICAN AMERICAN: 29 mL/min — AB (ref >60)
GLUCOSE: 154 mg/dL — AB (ref 65–99)
Potassium: 4.1 mmol/L (ref 3.5–5.1)
SODIUM: 140 mmol/L (ref 135–145)

## 2017-05-09 LAB — GLUCOSE, CAPILLARY
Glucose-Capillary: 161 mg/dL — ABNORMAL HIGH (ref 65–99)
Glucose-Capillary: 206 mg/dL — ABNORMAL HIGH (ref 65–99)
Glucose-Capillary: 122 mg/dL — ABNORMAL HIGH (ref 65–99)
Glucose-Capillary: 191 mg/dL — ABNORMAL HIGH (ref 65–99)

## 2017-05-09 MED ORDER — SODIUM CHLORIDE 0.9 % IV SOLN
500.0000 mg | INTRAVENOUS | Status: DC
  Administered 2017-05-10: 0.5 g via INTRAVENOUS
  Filled 2017-05-09: qty 0.5

## 2017-05-09 NOTE — Care Management (Signed)
Patient admitted form home due to prostate abscesses.  Patient lives at home with significant other.  PCP Linthavong.  Pharmacy [facility]. PT has assessed patient and recommends home health PT.  Patient agreeable.  Patient has RW and cane in the home for ambulation, but is non compliant.  [facility] is open with [facility] for [facility]. Patient will discharge with resumption of IV antibiotics, dose will be decreased to 500 mg.  Per Barbara CowerJason with [facility] Medication will be delivered to home 05/10/17.  Resumption orders will be required for RN and PT.  Patient to receive ertapenem 05/09/17 at 2100 .

## 2017-05-09 NOTE — Progress Notes (Signed)
[facility] [place] [facility]   PATIENT NAME: Phillip Osborne    MR#:  [id number]  DATE OF BIRTH:  1932/01/29  SUBJECTIVE:  Pt Feeling weak denies any chest pain or shortness of breath  REVIEW OF SYSTEMS:  CONSTITUTIONAL: No fever,Positive fatigue or weakness.  EYES: No blurred or double vision.  EARS, NOSE, AND THROAT: No tinnitus or ear pain.  RESPIRATORY: No cough, shortness of breath, wheezing or hemoptysis.  CARDIOVASCULAR: No chest pain, orthopnea, edema.  GASTROINTESTINAL: Reports nausea, denies vomiting, diarrhea or abdominal pain.  GENITOURINARY: Positive dysuria, positive hematuria.  ENDOCRINE: No polyuria, nocturia,  HEMATOLOGY: No anemia, easy bruising or bleeding SKIN: No rash or lesion. MUSCULOSKELETAL: No joint pain or arthritis.   NEUROLOGIC: No tingling, numbness, weakness.  PSYCHIATRY: No anxiety or depression.   DRUG ALLERGIES:   Allergies  Allergen Reactions  . Enalapril Swelling    Face swelling.  . Lisinopril Swelling    mouth    VITALS:  Blood pressure (!) 146/82, pulse 88, temperature (!) 97.5 F (36.4 C), temperature source Oral, resp. rate 18, height 5\' 5"  (1.651 m), weight 160 lb 11.2 oz (72.9 kg), SpO2 97 %.  PHYSICAL EXAMINATION:  GENERAL:  83 y.o.-year-old patient lying in the bed with no acute distress.  EYES: Pupils equal, round, reactive to light and accommodation. No scleral icterus. Extraocular muscles intact.  HEENT: Head atraumatic, normocephalic. Oropharynx and nasopharynx clear.  NECK:  Supple, no jugular venous distention. No thyroid enlargement, no tenderness.  LUNGS: Normal breath sounds bilaterally, no wheezing, rales,rhonchi or crepitation. No use of accessory muscles of respiration.  CARDIOVASCULAR: S1, S2 normal. No murmurs, rubs, or gallops.  ABDOMEN: Soft, nontender, nondistended. Bowel sounds present. No organomegaly or mass.  EXTREMITIES: No pedal edema, cyanosis, or clubbing.   NEUROLOGIC: Cranial nerves are grossly intact. Muscle strength 5/5 in all extremities. Sensation intact. Gait not checked.  PSYCHIATRIC: The patient is alert and oriented x2- 3.  SKIN: No obvious rash, lesion, or ulcer.    LABORATORY PANEL:   CBC  Recent Labs Lab 05/09/17 0528  WBC 12.1*  HGB 9.2*  HCT 28.2*  PLT 253   ------------------------------------------------------------------------------------------------------------------  Chemistries   Recent Labs Lab 05/07/17 0928 05/09/17 0528  NA 138 140  K 4.0 4.1  CL 105 107  CO2 26 25  GLUCOSE 68 154*  BUN 33* 40*  CREATININE 2.06* 2.01*  CALCIUM 8.8* 9.0  AST 20  --   ALT 15*  --   ALKPHOS 73  --   BILITOT 0.9  --    ------------------------------------------------------------------------------------------------------------------  Cardiac Enzymes No results for input(s): TROPONINI in the last 168 hours. ------------------------------------------------------------------------------------------------------------------  RADIOLOGY:  No results found.  EKG:   Orders placed or performed during the hospital encounter of 05/05/17  . EKG 12-Lead  . EKG 12-Lead  . ED EKG  . ED EKG    ASSESSMENT AND PLAN:   #Sepsis- Due to prostate abscesses Status post transurethral resection of the prostate, left ureteroscopic with laser lithotripsy Appreciate ID input and recommendations Continue meropenem  Antibiotics orders have been written per ID  #Chronic kidney disease stage IV Avoid nephrotoxins and monitor renal function Renal function stable  #Hypothyroidism TSH normal continue Synthroid  #Gout-stable continue ULORIC  #Seizure disorder-continue home medication Keppra and Lamictal   DVT prophylaxis with heparin subcutaneous    All the records are reviewed and case discussed with Care Management/Social Workerr. Management plans discussed with the patient, daughter and they are in agreement.  CODE  STATUS:   TOTAL TIME TAKING CARE OF THIS PATIENT: 32minutes.   Discharge tomorrow at home with IV antibiotics  Note: This dictation was prepared with Dragon dictation along with smaller phrase technology. Any transcriptional errors that result from this process are unintentional.   Phillip Osborne, Phillip Osborne on 05/09/2017 at 2:55 PM  Between 7am to 6pm - Pager - [phone] After 6pm go to [email or web address] - password [email or web address] [facility]  [facility]  Office  [phone]  CC: Primary care physician; Marisue IvanLinthavong, Kanhka, MD

## 2017-05-09 NOTE — Evaluation (Signed)
Clinical/Bedside Swallow Evaluation Patient Details  Name: Phillip Osborne MRN: [id number] Date of Birth: January 05, 1932  Today's Date: 05/09/2017 Time: SLP Start Time (ACUTE ONLY): 0840 SLP Stop Time (ACUTE ONLY): 0940 SLP Time Calculation (min) (ACUTE ONLY): 60 min  Past Medical History:  Past Medical History:  Diagnosis Date  . Diabetes mellitus without complication (HCC)   . Gout   . Hypertension   . Kidney stone   . Seizures (HCC)    last seizure about 1 year ago(2017)  . Thyroid disease    Past Surgical History:  Past Surgical History:  Procedure Laterality Date  . CYSTOSCOPY/URETEROSCOPY/HOLMIUM LASER/STENT PLACEMENT Left 05/07/2017   Procedure: CYSTOSCOPY/URETEROSCOPY/HOLMIUM LASER/STENT PLACEMENT;  Surgeon: Vanna ScotlandBrandon, Ashley, MD;  Location: [facility] ORS;  Service: Urology;  Laterality: Left;  . EXTRACORPOREAL SHOCK WAVE LITHOTRIPSY Left 02/22/2016   Procedure: EXTRACORPOREAL SHOCK WAVE LITHOTRIPSY (ESWL);  Surgeon: Hildred LaserBrian James Budzyn, MD;  Location: [facility] ORS;  Service: Urology;  Laterality: Left;  . EYE SURGERY Left    cataract surgery  . HERNIA REPAIR  70's  . TRANSURETHRAL RESECTION OF PROSTATE N/A 05/07/2017   Procedure: TRANSURETHRAL RESECTION OF THE PROSTATE (TURP);  Surgeon: Vanna ScotlandBrandon, Ashley, MD;  Location: [facility] ORS;  Service: Urology;  Laterality: N/A;   HPI:  Patient is a 83 y/o male with past medical history of diabetes, hypertension, seizures, gout, kidney stones, and recent urinary tract infection presents to the emergency department complaining of weakness. The patient had similar symptoms when he was recently diagnosed with a urinary tract infection which grew ESBL Escherichia coli. The patient has been on extended course of ertapenem since that time. He has been ambulating short distances in his home with the use of a walker. However today the patient was unable to ambulate without feeling as if he might fall. He has not had any falls. The pain is mostly in his back and his  knees. This is chronic pain. Pt has been having weakness, fevers, dysuria, and urinary incontinence in the setting of chronic urinary tract infection. CT scan revealing a large prostatic abscess. Urology has been following and treating. NSG and pt denied any overt s/s of aspiration/dysphagia; pt has been more weak in setting of illness and required brief oral intubation for Urology procedure.    Assessment / Plan / Recommendation Clinical Impression  Pt appears to present w/ adequate oropharyngeal phase swallow function w/ trials of thin liquids and purees/soft solids given; no overt s/s of aspiration occurring w/ trials during this eval. Pt fed self trials of ice chips then thin liquids and purees/soft solids during breakfast meal exhibiting adequate oral phase bolus control and oral clearing b/t trials - min increased time attending to the meats. No coughing or throat clearing noted during/post trials of po's/thin liquids; clear vocal quality assessed post trials. Pt stated he does have tremorous/shaky UEs (baseline) and benefited from cutting meats and tray prep. Recommend a regular/dysphagia level 3 diet w/ thin liquids. Recommend general aspiration precautions and assistance w/ tray setup at meals; recommend pills given in puree - if needed for easier swallowing. ST services will be available for reconsult if any change in status while admitted. NSG updated. SLP Visit Diagnosis: Dysphagia, unspecified (R13.10)    Aspiration Risk   (reduced )    Diet Recommendation  regular/mech soft w/ meats cut; thin liquids. Aspiration precautions; tray setup at meals  Medication Administration: Whole meds with liquid (can always use a puree for easier swallowing if needed)    Other  Recommendations Recommended  Consults:  (Dietician f/u ) Oral Care Recommendations: Oral care BID;Patient independent with oral care;Staff/trained caregiver to provide oral care   Follow up Recommendations None      Frequency  and Duration  n/a         Prognosis Prognosis for Safe Diet Advancement: Good Barriers to Reach Goals:  (none)      Swallow Study   General Date of Onset: 05/05/17 HPI: Patient is a 83 y/o male with past medical history of diabetes, hypertension, seizures, gout, kidney stones, and recent urinary tract infection presents to the emergency department complaining of weakness. The patient had similar symptoms when he was recently diagnosed with a urinary tract infection which grew ESBL Escherichia coli. The patient has been on extended course of ertapenem since that time. He has been ambulating short distances in his home with the use of a walker. However today the patient was unable to ambulate without feeling as if he might fall. He has not had any falls. The pain is mostly in his back and his knees. This is chronic pain. Pt has been having weakness, fevers, dysuria, and urinary incontinence in the setting of chronic urinary tract infection. CT scan revealing a large prostatic abscess. Urology has been following and treating. NSG and pt denied any overt s/s of aspiration/dysphagia; pt has been more weak in setting of illness and required brief oral intubation for Urology procedure.  Type of Study: Bedside Swallow Evaluation Previous Swallow Assessment: none Diet Prior to this Study: Regular;Thin liquids Temperature Spikes Noted: No (wbc elevated) Respiratory Status: Room air History of Recent Intubation: Yes Length of Intubations (days): 1 days Date extubated: 05/07/17 Behavior/Cognition: Alert;Cooperative;Pleasant mood Oral Cavity Assessment: Within Functional Limits Oral Care Completed by SLP: Recent completion by staff Oral Cavity - Dentition: Adequate natural dentition;Missing dentition (few) Vision: Functional for self-feeding Self-Feeding Abilities: Able to feed self;Needs assist;Needs set up (shaky/tremorous UEs - baseline) Patient Positioning: Upright in bed Baseline Vocal Quality:  Low vocal intensity (min) Volitional Cough: Strong Volitional Swallow: Able to elicit    Oral/Motor/Sensory Function Overall Oral Motor/Sensory Function: Within functional limits   Ice Chips Ice chips: Within functional limits Presentation: Spoon (fed; 2 trials)   Thin Liquid Thin Liquid: Within functional limits Presentation: Cup;Self Fed;Straw (~6 ozs)    Nectar Thick Nectar Thick Liquid: Not tested   Honey Thick Honey Thick Liquid: Not tested   Puree Puree: Within functional limits Presentation: Self Fed;Spoon (10+ trials)   Solid   GO   Solid: Within functional limits (mech soft trials) Presentation: Self Fed;Spoon (6 trials) Other Comments: cut pieces of food were easier for pt to self-feed d/t UE tremors/shakiness         Phillip SomKatherine Donnetta Gillin, MS, CCC-SLP Maze Corniel 05/09/2017,10:09 AM

## 2017-05-09 NOTE — Progress Notes (Signed)
[facility] Date of Admission:  05/05/2017     ID: Phillip Osborne is a 83 y.o. male with prostate abscesss Active Problems:   Weakness   Prostatic abscess   Prostate abscess   Subjective: No fevers, more alert, working with PT  ROS  Eleven systems are reviewed and negative except per hpi  Medications:  Antibiotics Given (last 72 hours)    Date/Time Action Medication Dose Rate   05/06/17 1900 New Bag/Given   vancomycin (VANCOCIN) IVPB 1000 mg/200 mL premix 1,000 mg 200 mL/hr   05/06/17 2334 New Bag/Given   meropenem (MERREM) IVPB SOLR 500 mg 500 mg 100 mL/hr   05/07/17 1230 New Bag/Given   meropenem (MERREM) IVPB SOLR 500 mg 500 mg 100 mL/hr   05/08/17 0305 New Bag/Given   meropenem (MERREM) IVPB SOLR 500 mg 500 mg 100 mL/hr   05/08/17 1208 New Bag/Given   meropenem (MERREM) IVPB SOLR 500 mg 500 mg 100 mL/hr   05/09/17 1231 New Bag/Given   meropenem (MERREM) 500 mg in sodium chloride 0.9 % 50 mL IVPB 500 mg 100 mL/hr     . atorvastatin  20 mg Oral QHS  . docusate sodium  100 mg Oral BID  . donepezil  5 mg Oral QHS  . febuxostat  40 mg Oral Daily  . ferrous sulfate  325 mg Oral Daily  . folic acid  1 mg Oral Daily  . gabapentin  300 mg Oral TID  . heparin  5,000 Units Subcutaneous Q8H  . insulin aspart  0-9 Units Subcutaneous TID WC  . insulin glargine  30 Units Subcutaneous Daily  . lamoTRIgine  50 mg Oral Daily  . levETIRAcetam  500 mg Oral BID  . levothyroxine  75 mcg Oral QAC breakfast  . patiromer  8.4 g Oral Daily  . sodium bicarbonate  1,300 mg Oral TID    Objective: Vital signs in last 24 hours: Temp:  [97.5 F (36.4 C)-98.6 F (37 C)] 97.5 F (36.4 C) (08/03 1313) Pulse Rate:  [77-88] 88 (08/03 1313) Resp:  [18-24] 18 (08/03 1313) BP: (101-146)/(63-82) 146/82 (08/03 1313) SpO2:  [97 %-98 %] 97 % (08/03 1313) Weight:  [72.9 kg (160 lb 11.2 oz)] 72.9 kg (160 lb 11.2 oz) (08/03 0500) Physical Exam   Constitutional: He is awake and interactive HENT: anicteric Mouth/Throat: Oropharynx is clear and moist. No oropharyngeal exudate.  Cardiovascular: Normal rate, regular rhythm and normal heart sounds.Pulmonary/Chest: Effort normal and breath sounds normal. No respiratory distress. He has no wheezes.  Abdominal: Soft. Bowel sounds are normal. He exhibits no distension. There is no tenderness.  GU - foley with pink urine Lymphadenopathy:  He has no cervical adenopathy.  Neurological: He is alert and oriented to person, place, and time.  Skin: Skin is warm and dry. No rash noted. No erythema.  Psychiatric: He has a normal mood and affect. His behavior is normal.     Lab Results  Recent Labs  05/07/17 0311 05/07/17 0928 05/08/17 0517 05/09/17 0528  WBC 9.2  --  13.9*  --   HGB 9.5*  --  8.7*  --   HCT 27.9*  --  26.2*  --   NA 141 138  --  140  K 4.3 4.0  --  4.1  CL 106 105  --  107  CO2 26 26  --  25  BUN 27* 33*  --  40*  CREATININE 2.13* 2.06*  --  2.01*  Microbiology: Results for orders placed or performed during the hospital encounter of 05/05/17  Surgical PCR screen     Status: None   Collection Time: 05/05/17 10:07 PM  Result Value Ref Range Status   MRSA, PCR NEGATIVE NEGATIVE Final   Staphylococcus aureus NEGATIVE NEGATIVE Final    Comment:        The Xpert SA Assay (FDA approved for NASAL specimens in patients over 81 years of age), is one component of a comprehensive surveillance program.  Test performance has been validated by [facility] for patients greater than or equal to 81 year old. It is not intended to diagnose infection nor to guide or monitor treatment.   Urine Culture     Status: None   Collection Time: 05/05/17 10:48 PM  Result Value Ref Range Status   Specimen Description URINE, RANDOM  Final   Special Requests Normal  Final   Culture   Final    NO GROWTH Performed at [facility], [address] [place], [place] [address]     Report Status 05/07/2017 FINAL  Final  CULTURE, BLOOD (ROUTINE X 2) w Reflex to ID Panel     Status: None (Preliminary result)   Collection Time: 05/06/17 10:00 AM  Result Value Ref Range Status   Specimen Description BLOOD RIGHT HAND  Final   Special Requests   Final    BOTTLES DRAWN AEROBIC AND ANAEROBIC Blood Culture results may not be optimal due to an inadequate volume of blood received in culture bottles   Culture NO GROWTH 3 DAYS  Final   Report Status PENDING  Incomplete  CULTURE, BLOOD (ROUTINE X 2) w Reflex to ID Panel     Status: None (Preliminary result)   Collection Time: 05/06/17 10:08 AM  Result Value Ref Range Status   Specimen Description BLOOD RIGHT WRIST  Final   Special Requests   Final    BOTTLES DRAWN AEROBIC AND ANAEROBIC Blood Culture adequate volume   Culture NO GROWTH 3 DAYS  Final   Report Status PENDING  Incomplete    Studies/Results: No results found.  Assessment/Plan: Phillip Osborne is a 83 y.o. male with several weeks of dysuria and repeat findings of ESBL E coli on UCX but minimal inflammation on UA, started on ertapenem 7.28 but now with progressive decline and fevers despite broad spectrum abx. He has hx prior stones as well as dementia. Followed by urology as otpt.  CT shows prostatitis with large periprostatic abscess. S.p unroofing 8/1 and clinically improving.   Rec Will continue ertapenem but will need min 28 day course Has picc and please see abx order sheet. He may benefit from extension of course if still sxs of prostatitis  Will see in 4 weeks  Thank you very much for the consult. Will follow with you.  Mick SellDavid P Fitzgerald   05/09/2017, 1:19 PM

## 2017-05-09 NOTE — Progress Notes (Signed)
Urology Consult Follow Up  Subjective: PT consult pending. Hematuria minimal CBI off for 24 hours. Afebrile.    Anti-infectives: Anti-infectives    Start     Dose/Rate Route Frequency Ordered Stop   05/09/17 1200  meropenem (MERREM) 500 mg in sodium chloride 0.9 % 50 mL IVPB     500 mg 100 mL/hr over 30 Minutes Intravenous Every 12 hours 05/08/17 1612     05/06/17 1800  vancomycin (VANCOCIN) IVPB 1000 mg/200 mL premix  Status:  Discontinued     1,000 mg 200 mL/hr over 60 Minutes Intravenous Every 18 hours 05/06/17 1032 05/06/17 2053   05/06/17 1200  meropenem (MERREM) 500 mg in sodium chloride 0.9 % 50 mL IVPB  Status:  Discontinued     500 mg 100 mL/hr over 30 Minutes Intravenous Every 12 hours 05/06/17 0908 05/06/17 0932   05/06/17 1200  meropenem (MERREM) IVPB SOLR 500 mg  Status:  Discontinued     500 mg 100 mL/hr over 30 Minutes Intravenous Every 12 hours 05/06/17 0933 05/08/17 1610   05/06/17 1000  vancomycin (VANCOCIN) IVPB 1000 mg/200 mL premix     1,000 mg 200 mL/hr over 60 Minutes Intravenous  Once 05/06/17 0906 05/06/17 1250   05/06/17 0800  ertapenem (INVANZ) 0.5 g in sodium chloride 0.9 % 50 mL IVPB  Status:  Discontinued     500 mg 100 mL/hr over 30 Minutes Intravenous Every 24 hours 05/06/17 0741 05/06/17 0858   05/06/17 0630  ertapenem (INVANZ) 1 g in sodium chloride 0.9 % 50 mL IVPB  Status:  Discontinued     1 g 100 mL/hr over 30 Minutes Intravenous Every 24 hours 05/06/17 0616 05/06/17 0741      Current Facility-Administered Medications  Medication Dose Route Frequency Provider Last Rate Last Dose  . acetaminophen (TYLENOL) tablet 650 mg  650 mg Oral Q6H PRN Arnaldo Nataliamond, Michael S, MD   650 mg at 05/06/17 1539   Or  . acetaminophen (TYLENOL) suppository 650 mg  650 mg Rectal Q6H PRN Arnaldo Nataliamond, Michael S, MD      . atorvastatin (LIPITOR) tablet 20 mg  20 mg Oral QHS Arnaldo Nataliamond, Michael S, MD   20 mg at 05/08/17 2109  . docusate sodium (COLACE) capsule 100 mg  100 mg  Oral BID Arnaldo Nataliamond, Michael S, MD   100 mg at 05/09/17 1024  . donepezil (ARICEPT) tablet 5 mg  5 mg Oral QHS Arnaldo Nataliamond, Michael S, MD   5 mg at 05/08/17 2109  . febuxostat (ULORIC) tablet 40 mg  40 mg Oral Daily Arnaldo Nataliamond, Michael S, MD   40 mg at 05/09/17 1024  . ferrous sulfate tablet 325 mg  325 mg Oral Daily Arnaldo Nataliamond, Michael S, MD   325 mg at 05/09/17 1024  . folic acid (FOLVITE) tablet 1 mg  1 mg Oral Daily Arnaldo Nataliamond, Michael S, MD   1 mg at 05/09/17 1024  . gabapentin (NEURONTIN) capsule 300 mg  300 mg Oral TID Arnaldo Nataliamond, Michael S, MD   300 mg at 05/09/17 1024  . heparin injection 5,000 Units  5,000 Units Subcutaneous Q8H Arnaldo Nataliamond, Michael S, MD   5,000 Units at 05/09/17 0536  . HYDROcodone-acetaminophen (NORCO/VICODIN) 5-325 MG per tablet 1 tablet  1 tablet Oral Q6H PRN Arnaldo Nataliamond, Michael S, MD   1 tablet at 05/09/17 0557  . insulin aspart (novoLOG) injection 0-9 Units  0-9 Units Subcutaneous TID WC Arnaldo Nataliamond, Michael S, MD   3 Units at 05/09/17 1228  . insulin glargine (LANTUS) injection  30 Units  30 Units Subcutaneous Daily Arnaldo Natal, MD   30 Units at 05/09/17 1025  . lamoTRIgine (LAMICTAL) tablet 50 mg  50 mg Oral Daily Gouru, Aruna, MD   50 mg at 05/09/17 1024  . levETIRAcetam (KEPPRA) tablet 500 mg  500 mg Oral BID Arnaldo Natal, MD   500 mg at 05/09/17 1024  . levothyroxine (SYNTHROID, LEVOTHROID) tablet 75 mcg  75 mcg Oral QAC breakfast Arnaldo Natal, MD   75 mcg at 05/09/17 0815  . meropenem (MERREM) 500 mg in sodium chloride 0.9 % 50 mL IVPB  500 mg Intravenous Q12H Auburn Bilberry, MD 100 mL/hr at 05/09/17 1231 500 mg at 05/09/17 1231  . morphine 2 MG/ML injection 1 mg  1 mg Intravenous Q6H PRN Auburn Bilberry, MD      . ondansetron [facility]) tablet 4 mg  4 mg Oral Q6H PRN Arnaldo Natal, MD       Or  . ondansetron [facility]) injection 4 mg  4 mg Intravenous Q6H PRN Arnaldo Natal, MD   4 mg at 05/06/17 1158  . patiromer Lelon Perla) packet 8.4 g  8.4 g Oral Daily Arnaldo Natal, MD   8.4 g at 05/08/17 1053  . sodium bicarbonate tablet 1,300 mg  1,300 mg Oral TID Arnaldo Natal, MD   1,300 mg at 05/09/17 1024  . sodium chloride flush (NS) 0.9 % injection 10-40 mL  10-40 mL Intracatheter PRN Gouru, Aruna, MD         Objective: Vital signs in last 24 hours: Temp:  [97.9 F (36.6 C)-98.6 F (37 C)] 97.9 F (36.6 C) (08/03 0553) Pulse Rate:  [77-88] 77 (08/03 0553) Resp:  [20-24] 20 (08/03 0553) BP: (101-137)/(63-76) 134/72 (08/03 0553) SpO2:  [98 %] 98 % (08/03 0553) Weight:  [160 lb 11.2 oz (72.9 kg)] 160 lb 11.2 oz (72.9 kg) (08/03 0500)  Intake/Output from previous day: 08/02 0701 - 08/03 0700 In: 2075 [P.O.:25; IV Piggyback:50] Out: 3600 [Urine:3600] Intake/Output this shift: Total I/O In: 0  Out: 200 [Urine:200]   Physical Exam  Constitutional: He is well-developed, well-nourished, and in no distress.  HENT:  Head: Normocephalic and atraumatic.  Cardiovascular: Normal rate and regular rhythm.   Abdominal: Soft. He exhibits no distension.  Genitourinary: Penis normal.  Genitourinary Comments: Foley in place draining light pink urine, no clots.  Skin: Skin is warm and dry.  Psychiatric: Affect normal.    Lab Results:   Recent Labs  05/07/17 0311 05/08/17 0517  WBC 9.2 13.9*  HGB 9.5* 8.7*  HCT 27.9* 26.2*  PLT 237 212   BMET  Recent Labs  05/07/17 0928 05/09/17 0528  NA 138 140  K 4.0 4.1  CL 105 107  CO2 26 25  GLUCOSE 68 154*  BUN 33* 40*  CREATININE 2.06* 2.01*  CALCIUM 8.8* 9.0   PT/INR  Recent Labs  05/07/17 0311  LABPROT 14.6  INR 1.13    Procedure:  3-way indwelling Foley cannulated using a wire into the bladder. Catheter was then removed and the wire left in place. 16 [place] tip was then inserted over a wire into the bladder. Balloon filled with 10 cc of sterile water. This was done using sterile technique as possible.     Assessment/ Plan:  1. Prostatic abscess- large central  prostatic abscess measuring 7 cm POD 2 s/p TUR unroofing.  Afebrile. Improving. Foley exchanged over wire to a 16 [place]-tip today.  Hand irrigate  as needed for hematuria or clots.  2. Left ureteral stone- s/p URS, stent placed.    3. ESBL E. Coli UTI- recurrent ESBL Escherichia coli despite fairly bland-appearing urines and IV antibiotics. Recommendations for post procedural duration antibiotics per ID, Dr. Sampson GoonFitzgerald.   Phillip Osborne is appropriate from urological perspective for discharge with Foley catheter. He'll keep the catheter and stent for approximately 7 -10 days.  Urology will sign off and follow-up has been arranged. Please page with any questions or concerns.       LOS: 1 day    Phillip Osborne 05/09/2017

## 2017-05-09 NOTE — Progress Notes (Addendum)
Infectious Disease Long Term IV Antibiotic Orders  Diagnosis: ESBL prostate abscess  Culture results Urine Culture, Routine - [facility] Final report (A)   Result 1 - [facility] Escherichia coli (A)  Comment: 50,000-100,000 colony forming units per mL   Antimicrobial Susceptibility - [facility] Comment  Comment: ** S = Susceptible; I = Intermediate; R = Resistant **  P = Positive; N = Negative MICS are expressed in micrograms per mL  Antibiotic RSLT#1RSLT#2RSLT#3RSLT#4 Amoxicillin/Clavulanic AcidI Ampicillin R CefazolinR Cefepime I CeftriaxoneR Cefuroxime R CiprofloxacinR ErtapenemS Gentamicin R Imipenem S Levofloxacin R MeropenemS Nitrofurantoin S Piperacillin/TazobactamS Tetracycline R Tobramycin I Trimethoprim/Sulfa R    Allergies:  Allergies  Allergen Reactions  . Enalapril Swelling    Face swelling.  . Lisinopril Swelling    mouth   Lab Results  Component Value Date   CREATININE 2.01 (H) 05/09/2017    Discharge antibiotics Ertapenem       500 mg every  24 hours  PICC Care per protocol Labs weekly while on IV antibiotics      CBC w diff   Cr  Planned duration of antibiotics 4 weeks  Stop date 06/05/17 Follow up clinic date TBD  FAX weekly labs to [phone]  Mick Sellavid P Herchel Hopkin, MD

## 2017-05-09 NOTE — Progress Notes (Signed)
Pharmacy Antibiotic Note  Phillip Osborne is a 83 y.o. male admitted on 05/05/2017 with  ESBL prostate abscess. ID following. Pharmacy consulted for meropenem dosing.  Plan: Continue meropenem 500 mg IV q12h based on renal function and indication.  Per ID notes, patient will likely be switched to ertapenem on discharge.  Height: 5\' 5"  (165.1 cm) Weight: 160 lb 11.2 oz (72.9 kg) IBW/kg (Calculated) : 61.5  Temp (24hrs), Avg:98.1 F (36.7 C), Min:97.5 F (36.4 C), Max:98.6 F (37 C)   Recent Labs Lab 05/05/17 2248 05/05/17 2249 05/07/17 0311 05/07/17 0928 05/08/17 0517 05/09/17 0528  WBC 8.4  --  9.2  --  13.9*  --   CREATININE 2.46*  --  2.13* 2.06*  --  2.01*  LATICACIDVEN  --  0.6  --   --   --   --     Estimated Creatinine Clearance: 24.6 mL/min (A) (by C-G formula based on SCr of 2.01 mg/dL (H)).    Allergies  Allergen Reactions  . Enalapril Swelling    Face swelling.  . Lisinopril Swelling    mouth    Antimicrobials this admission: 7/31 meropenem >>  7/31 vancomycin  Dose adjustments this admission:  Microbiology results: 7/30 BCx: No growth 3 days 7/30 UCx: negative  Thank you for allowing pharmacy to be a part of this patient's care.  Cindi CarbonMary M Cassi Jenne, PharmD, BCPS Clinical Pharmacist 05/09/2017 1:23 PM

## 2017-05-09 NOTE — Evaluation (Signed)
Physical Therapy Evaluation Patient Details Name: Phillip Osborne MRN: [id number] DOB: Jun 26, 1932 Today's Date: 05/09/2017   History of Present Illness  Pt is a 83 yo M, admitted to acute care on 7/30 with d/x of weakness, which led to transurethral unroofing surgery  on 8/1; curently post-op day 2. Prior to admission, pt ModI with all functonal mobility tasks, using RW for long-distance amb, and intermittently using SPC or "furniture walking" within the home. PMH: diabetes mellitus, gout, HTN, kidney stone, seizures, and thyroid disease.   Clinical Impression  Pt is pleasant and willing to participate for return to PLOF. Pt performs bed mobility with supervision, and tranfers and ambulation with CGA, due to impaired strength, endurance, and balance. Pt amb total of 1300ft, using RW for B UE support, and required no rest breaks. Overall, pt responded well to today's treatment with no adverse affects. Pt would benefit from skilled PT to address the previously mentioned impairments and promote return to PLOF. Based on the previously mentioned impairments, currently recommending HHPT, pending d/c.     Follow Up Recommendations Home health PT    Equipment Recommendations  None recommended by PT    Recommendations for Other Services       Precautions / Restrictions Precautions Precautions: Fall Restrictions Weight Bearing Restrictions: No      Mobility  Bed Mobility Overal bed mobility: Needs Assistance Bed Mobility: Supine to Sit     Supine to sit: Supervision     General bed mobility comments: Pt supervision with bed mobility, requiring increased time and min verbal cues for mechancis and safety   Transfers Overall transfer level: Needs assistance Equipment used: Rolling walker (2 wheeled) Transfers: Sit to/from Stand Sit to Stand: Min guard         General transfer comment: Pt CGA with sit to stand transfers, requiring increased time and B UE support from RW. Min cues  provided for [employer] and safety  Ambulation/Gait Ambulation/Gait assistance: Min guard Ambulation Distance (Feet): 100 Feet Assistive device: Rolling walker (2 wheeled) Gait Pattern/deviations: Step-through pattern     General Gait Details: Pt CGA with amb, requiring min cues for mechancis and safety, specifically to stay close to RW. Pt with good mechancis during turns, and excellent obstacle navigation.   Stairs            Wheelchair Mobility    Modified Rankin (Stroke Patients Only)       Balance Overall balance assessment: Needs assistance Sitting-balance support: Bilateral upper extremity supported;Feet supported Sitting balance-Leahy Scale: Good Sitting balance - Comments: Good sitting balance, requiring no cues for mechanics or safety.    Standing balance support: Bilateral upper extremity supported Standing balance-Leahy Scale: Fair Standing balance comment: Fiar stadning balance, requiring B UE support from RW. Supervision with static stadning, and CGA with dynamic standing balance.                              Pertinent Vitals/Pain Pain Assessment: No/denies pain    Home Living Family/patient expects to be discharged to:: Private residence Living Arrangements: Spouse/significant other Available Help at Discharge: Family Type of Home: House Home Access: Stairs to enter Entrance Stairs-Rails: Right Entrance Stairs-Number of Steps: 3 Home Layout: One level Home Equipment: Walker - 2 wheels;Cane - single point      Prior Function Level of Independence: Independent with assistive device(s)         Comments: Pt modI with AD, using cane and "  furniture walking" in the home and using RW when amb outside of the home and long distances. Pt reports amb ~1 block outside every day.      Hand Dominance        Extremity/Trunk Assessment   Upper Extremity Assessment Upper Extremity Assessment: Overall WFL for tasks assessed    Lower  Extremity Assessment Lower Extremity Assessment: Generalized weakness (MMT grossly 4/5 to B LE's.)       Communication   Communication: No difficulties  Cognition Arousal/Alertness: Awake/alert Behavior During Therapy: WFL for tasks assessed/performed Overall Cognitive Status: Within Functional Limits for tasks assessed                                        General Comments      Exercises Other Exercises Other Exercises: Supine therex performed to B LE's with supervision x10 reps: ankle pumps, quad sets, glute sets, SLR, and hip abd. Pt with good mechanics, requiring no cues.    Assessment/Plan    PT Assessment Patient needs continued PT services  PT Problem List Decreased strength;Decreased activity tolerance;Decreased balance;Decreased mobility;Decreased coordination       PT Treatment Interventions DME instruction;Gait training;Functional mobility training;Therapeutic activities;Therapeutic exercise;Stair training;Balance training;Patient/family education;Neuromuscular re-education;Manual techniques    PT Goals (Current goals can be found in the Care Plan section)  Acute Rehab PT Goals Patient Stated Goal: to go home  PT Goal Formulation: With patient/family Time For Goal Achievement: 05/23/17 Potential to Achieve Goals: Good    Frequency Min 2X/week   Barriers to discharge        Co-evaluation               AM-PAC PT "6 Clicks" Daily Activity  Outcome Measure Difficulty turning over in bed (including adjusting bedclothes, sheets and blankets)?: A Little Difficulty moving from lying on back to sitting on the side of the bed? : A Little Difficulty sitting down on and standing up from a chair with arms (e.g., wheelchair, bedside commode, etc,.)?: A Little Help needed moving to and from a bed to chair (including a wheelchair)?: A Little Help needed walking in hospital room?: A Little Help needed climbing 3-5 steps with a railing? : A Lot 6  Click Score: 17    End of Session Equipment Utilized During Treatment: Gait belt Activity Tolerance: Patient tolerated treatment well Patient left: in chair;with call bell/phone within reach;with chair alarm set Nurse Communication: Mobility status PT Visit Diagnosis: Unsteadiness on feet (R26.81);Other abnormalities of gait and mobility (R26.89);Muscle weakness (generalized) (M62.81)    Time: [id number] PT Time Calculation (min) (ACUTE ONLY): 28 min   Charges:         PT G Codes:        Sharman CheekLaura Trinh Osborne PT, SPT  Latanya MaudlinLaura M Taheerah Osborne 05/09/2017, 5:11 PM

## 2017-05-10 LAB — GLUCOSE, CAPILLARY
Glucose-Capillary: 115 mg/dL — ABNORMAL HIGH (ref 65–99)
Glucose-Capillary: 162 mg/dL — ABNORMAL HIGH (ref 65–99)
Glucose-Capillary: 85 mg/dL (ref 65–99)

## 2017-05-10 MED ORDER — SODIUM CHLORIDE 0.9 % IV SOLN: 500.0000 mg | INTRAVENOUS | 0 refills | Status: DC

## 2017-05-10 MED ORDER — HYDROCODONE-ACETAMINOPHEN 5-325 MG PO TABS: 1.0000 | ORAL_TABLET | Freq: Four times a day (QID) | ORAL | 0 refills | Status: DC | PRN

## 2017-05-10 MED ORDER — DOCUSATE SODIUM 100 MG PO CAPS: 100.0000 mg | ORAL_CAPSULE | Freq: Two times a day (BID) | ORAL | 0 refills | Status: DC | PRN

## 2017-05-10 MED ORDER — ACETAMINOPHEN 325 MG PO TABS: 325.0000 mg | ORAL_TABLET | Freq: Four times a day (QID) | ORAL | Status: AC | PRN

## 2017-05-10 MED ORDER — INSULIN GLARGINE 100 UNIT/ML ~~LOC~~ SOLN: 30.0000 [IU] | Freq: Every day | SUBCUTANEOUS | 11 refills | Status: AC

## 2017-05-10 NOTE — Progress Notes (Signed)
Called EMS for transport as family would not come to pick up patient. Notified Brad with [facility] that patient would be leaving by EMS tonight so he would know about setting up IV antibiotics and home health PT.

## 2017-05-10 NOTE — Clinical Social Work Note (Signed)
CSW spoke with Phillip Osborne, the patient's daughter, at the request of Phillip Osborne. The CSW explained to Phillip Osborne that 1. The patient has capability to make his own medical decisions, and he has decided that he wants to return home. 2. The patient's PT recommendation is for HHPT. Due to this recommendation, the patient's insurance will not cover SNF placement for STR as that is not the recommendation. 3. The patient being non-compliant at home with use of DME is not sufficient reason for the patient to admit to a SNF as he is able to ambulate safely with a RW for at least 100 feet as noted by PT. The CSW advised to the patient that she can choose to have the patient placed at SNF out of pocket; however, SNFs require up to 30 days prepayment up front ($4000-$8000). The CSW attempted to discuss the possibility of long term care options; however, the patient's daughter did not wish to discuss the options. CSW has alerted the attending MD of the discussion. CSW is signing off.  Phillip Osborne, MSW, Theresia MajorsLCSWA [phone]

## 2017-05-10 NOTE — Progress Notes (Signed)
Patient discharged to home via EMS. Nurse had given patient information and instruction on emptying foley catheter and had patient practice. Patient left with all his belongings. VSS

## 2017-05-10 NOTE — Progress Notes (Signed)
Pt's daughter [name] returned phone call and she stated that she would see if she could get her sister Phillip SmartBenita to call the hospital back.

## 2017-05-10 NOTE — Discharge Summary (Signed)
[facility] - [place] at [facility]   PATIENT NAME: Phillip Osborne    MR#:  [id number]  DATE OF BIRTH:  [birth year]  DATE OF ADMISSION:  05/05/2017 ADMITTING PHYSICIAN: Arnaldo NatalMichael S Diamond, MD  DATE OF DISCHARGE:  05/10/17 PRIMARY CARE PHYSICIAN: Marisue IvanLinthavong, Kanhka, MD    ADMISSION DIAGNOSIS:  Dysuria [R30.0] Weakness [R53.1] S/P PICC central line placement [Z95.828]  DISCHARGE DIAGNOSIS:  Active Problems:   Weakness   Prostatic abscess   Prostate abscess   SECONDARY DIAGNOSIS:   Past Medical History:  Diagnosis Date  . Diabetes mellitus without complication (HCC)   . Gout   . Hypertension   . Kidney stone   . Seizures (HCC)    last seizure about 1 year ago(2017)  . Thyroid disease     HOSPITAL COURSE:   HPI: The patient with past medical history of diabetes, hypertension seizures and recent urinary tract infection presents to the emergency department complaining of weakness. The patient had similar symptoms when he was recently diagnosed with a urinary tract infection which grew ESBL Escherichia coli. The patient has been on extended course of ertapenem since that time. He has been ambulating short distances in his home with the use of a walker. However today the patient was unable to ambulate without feeling as if he might fall. He has not had any falls. The pain is mostly in his back and his knees. This is chronic pain. Multiple times and really the patient in the emergency department were unsuccessful which prompted the emergency department staff to call the hospitalist service for admission  Hospital course  #.Sepsis from prostate abscess central prostatic abscess measuring 7 cm POD 3  s/p TUR unroofing.  Afebrile. Improving. Foley exchanged over wire to a 16 [place]-tip today. Discharge patient with Foley catheter until evaluated by urology Pain management as needed, prescribing Norco 30 tablets with no refills Will continue ertapenem  need min 28 day course per ID Follow-up with infectious disease in 4 weeks Follow-up with urology in 1 week  # Left ureteral stone- s/p URS, stent placed.  #Chronic kidney disease stage IV Avoid nephrotoxins and monitor renal function Renal function stable  #Hypothyroidism TSH normal continue Synthroid  #Gout-stable continue ULORIC  #Seizure disorder-continue home medication Keppra and Lamictal   DVT prophylaxis with heparin subcutaneous during hospital course  DISCHARGE CONDITIONS:   stable  CONSULTS OBTAINED:  Treatment Team:  Mick SellFitzgerald, David P, MD Jerilee FieldEskridge, Matthew, MD   PROCEDURES  s/p TUR unroofing  DRUG ALLERGIES:   Allergies  Allergen Reactions  . Enalapril Swelling    Face swelling.  . Lisinopril Swelling    mouth    DISCHARGE MEDICATIONS:   Current Discharge Medication List    START taking these medications   Details  acetaminophen (TYLENOL) 325 MG tablet Take 1 tablet (325 mg total) by mouth every 6 (six) hours as needed for mild pain (or Fever >/= 101).    docusate sodium (COLACE) 100 MG capsule Take 1 capsule (100 mg total) by mouth 2 (two) times daily as needed for mild constipation. Qty: 10 capsule, Refills: 0    ertapenem 0.5 g in sodium chloride 0.9 % 50 mL Inject 0.5 g into the vein daily. Qty: 28 Dose, Refills: 0      CONTINUE these medications which have CHANGED   Details  HYDROcodone-acetaminophen (NORCO/VICODIN) 5-325 MG tablet Take 1 tablet by mouth every 6 (six) hours as needed for moderate pain. Qty: 30 tablet, Refills: 0  insulin glargine (LANTUS) 100 UNIT/ML injection Inject 0.3 mLs (30 Units total) into the skin daily. Qty: 10 mL, Refills: 11      CONTINUE these medications which have NOT CHANGED   Details  atorvastatin (LIPITOR) 20 MG tablet Take 20 mg by mouth at bedtime.     donepezil (ARICEPT) 5 MG tablet Take 5 mg by mouth at bedtime.    febuxostat (ULORIC) 40 MG tablet Take 40 mg by mouth daily.     ferrous sulfate 325 (65 FE) MG tablet Take 325 mg by mouth daily.    folic acid (FOLVITE) 1 MG tablet Take 1 mg by mouth daily.    gabapentin (NEURONTIN) 300 MG capsule Take 300 mg by mouth 3 (three) times daily.     glucose blood (ACCU-CHEK AVIVA) test strip 1 each by Other route 3 (three) times daily. (Fasting and 2 hours after largest meal)    insulin lispro (HUMALOG) 100 UNIT/ML injection Inject 5 Units into the skin 3 (three) times daily before meals. (Hold if CBG is less than 100)    lamoTRIgine (LAMICTAL) 25 MG tablet Take 1 tablet (25 mg total) by mouth daily. Qty: 30 tablet, Refills: 0    levETIRAcetam (KEPPRA) 500 MG tablet Take 500 mg by mouth 2 (two) times daily.     levothyroxine (SYNTHROID, LEVOTHROID) 75 MCG tablet Take 75 mcg by mouth daily.     patiromer (VELTASSA) 8.4 g packet Take 1 packet (8.4 g total) by mouth daily. Qty: 30 packet, Refills: 1    sodium bicarbonate 650 MG tablet Take 1,300 mg by mouth 3 (three) times daily.       STOP taking these medications     ertapenem 1 g in sodium chloride 0.9 % 50 mL          DISCHARGE INSTRUCTIONS:   Continue Foley catheter until seen and evaluated by urology in a week Follow-up with primary care physician in 5-7 days Follow-up with urology Dr. Apolinar Junes in a week Follow-up with infectious disease in 4 weeks   DIET:  Regular diet  DISCHARGE CONDITION:  Stable  ACTIVITY:  Activity as tolerated  OXYGEN:  Home Oxygen: No.   Oxygen Delivery: room air  DISCHARGE LOCATION:  home  With home health continue Foley catheter  If you experience worsening of your admission symptoms, develop shortness of breath, life threatening emergency, suicidal or homicidal thoughts you must seek medical attention immediately by calling 911 or calling your MD immediately  if symptoms less severe.  You Must read complete instructions/literature along with all the possible adverse reactions/side effects for all the Medicines  you take and that have been prescribed to you. Take any new Medicines after you have completely understood and accpet all the possible adverse reactions/side effects.   Please note  You were cared for by a hospitalist during your hospital stay. If you have any questions about your discharge medications or the care you received while you were in the hospital after you are discharged, you can call the unit and asked to speak with the hospitalist on call if the hospitalist that took care of you is not available. Once you are discharged, your primary care physician will handle any further medical issues. Please note that NO REFILLS for any discharge medications will be authorized once you are discharged, as it is imperative that you return to your primary care physician (or establish a relationship with a primary care physician if you do not have one) for your aftercare needs  so that they can reassess your need for medications and monitor your lab values.     Today  Chief Complaint  Patient presents with  . Weakness  . Dysuria   Patient is more alert and oriented and wants to go home . Not considering rehabilitation center discussed with patient's daughter [name], reported that her dad's friend TIM will come and take him home  ROS:  CONSTITUTIONAL: Denies fevers, chills. Denies any fatigue, weakness.  EYES: Denies blurry vision, double vision, eye pain. EARS, NOSE, THROAT: Denies tinnitus, ear pain, hearing loss. RESPIRATORY: Denies cough, wheeze, shortness of breath.  CARDIOVASCULAR: Denies chest pain, palpitations, edema.  GASTROINTESTINAL: Denies nausea, vomiting, diarrhea, abdominal pain. Denies bright red blood per rectum. GENITOURINARY: Denies dysuria, hematuria. ENDOCRINE: Denies nocturia or thyroid problems. HEMATOLOGIC AND LYMPHATIC: Denies easy bruising or bleeding. SKIN: Denies rash or lesion. MUSCULOSKELETAL: Denies pain in neck, back, shoulder, knees, hips or arthritic symptoms.   NEUROLOGIC: Denies paralysis, paresthesias.  PSYCHIATRIC: Denies anxiety or depressive symptoms.   VITAL SIGNS:  Blood pressure 109/73, pulse 87, temperature 98.1 F (36.7 C), temperature source Oral, resp. rate 18, height 5\' 5"  (1.651 m), weight 73.5 kg (162 lb), SpO2 97 %.  I/O:    Intake/Output Summary (Last 24 hours) at 05/10/17 1356 Last data filed at 05/10/17 1257  Gross per 24 hour  Intake              120 ml  Output             2025 ml  Net            -1905 ml    PHYSICAL EXAMINATION:  GENERAL:  83 y.o.-year-old patient lying in the bed with no acute distress.  EYES: Pupils equal, round, reactive to light and accommodation. No scleral icterus. Extraocular muscles intact.  HEENT: Head atraumatic, normocephalic. Oropharynx and nasopharynx clear.  NECK:  Supple, no jugular venous distention. No thyroid enlargement, no tenderness.  LUNGS: Normal breath sounds bilaterally, no wheezing, rales,rhonchi or crepitation. No use of accessory muscles of respiration.  CARDIOVASCULAR: S1, S2 normal. No murmurs, rubs, or gallops.  ABDOMEN: Soft, non-tender, non-distended. Bowel sounds present. No organomegaly or mass.Foley catheter intact  EXTREMITIES: No pedal edema, cyanosis, or clubbing.  NEUROLOGIC: Cranial nerves II through XII are intact. Muscle strength At his baseline in  all extremities. Sensation intact. Gait not checked.  PSYCHIATRIC: The patient is alert and oriented x 3.  SKIN: No obvious rash, lesion, or ulcer.   DATA REVIEW:   CBC  Recent Labs Lab 05/09/17 0528  WBC 12.1*  HGB 9.2*  HCT 28.2*  PLT 253    Chemistries   Recent Labs Lab 05/07/17 0928 05/09/17 0528  NA 138 140  K 4.0 4.1  CL 105 107  CO2 26 25  GLUCOSE 68 154*  BUN 33* 40*  CREATININE 2.06* 2.01*  CALCIUM 8.8* 9.0  AST 20  --   ALT 15*  --   ALKPHOS 73  --   BILITOT 0.9  --     Cardiac Enzymes No results for input(s): TROPONINI in the last 168 hours.  Microbiology Results   Results for orders placed or performed during the hospital encounter of 05/05/17  Surgical PCR screen     Status: None   Collection Time: 05/05/17 10:07 PM  Result Value Ref Range Status   MRSA, PCR NEGATIVE NEGATIVE Final   Staphylococcus aureus NEGATIVE NEGATIVE Final    Comment:  The Xpert SA Assay (FDA approved for NASAL specimens in patients over 35 years of age), is one component of a comprehensive surveillance program.  Test performance has been validated by [facility] for patients greater than or equal to 84 year old. It is not intended to diagnose infection nor to guide or monitor treatment.   Urine Culture     Status: None   Collection Time: 05/05/17 10:48 PM  Result Value Ref Range Status   Specimen Description URINE, RANDOM  Final   Special Requests Normal  Final   Culture   Final    NO GROWTH Performed at [facility], [address] [place], [place] [address]    Report Status 05/07/2017 FINAL  Final  CULTURE, BLOOD (ROUTINE X 2) w Reflex to ID Panel     Status: None (Preliminary result)   Collection Time: 05/06/17 10:00 AM  Result Value Ref Range Status   Specimen Description BLOOD RIGHT HAND  Final   Special Requests   Final    BOTTLES DRAWN AEROBIC AND ANAEROBIC Blood Culture results may not be optimal due to an inadequate volume of blood received in culture bottles   Culture NO GROWTH 4 DAYS  Final   Report Status PENDING  Incomplete  CULTURE, BLOOD (ROUTINE X 2) w Reflex to ID Panel     Status: None (Preliminary result)   Collection Time: 05/06/17 10:08 AM  Result Value Ref Range Status   Specimen Description BLOOD RIGHT WRIST  Final   Special Requests   Final    BOTTLES DRAWN AEROBIC AND ANAEROBIC Blood Culture adequate volume   Culture NO GROWTH 4 DAYS  Final   Report Status PENDING  Incomplete    RADIOLOGY:  Dg Chest Port 1 View  Result Date: 05/06/2017 CLINICAL DATA:  Sepsis EXAM: PORTABLE CHEST 1 VIEW COMPARISON:  05/06/2017  FINDINGS: Stable left PICC. Normal heart size. Lungs under aerated with bibasilar atelectasis. No pneumothorax. IMPRESSION: Bibasilar atelectasis. Electronically Signed   By: Jolaine Click M.D.   On: 05/06/2017 16:37   Ct Renal Stone Study  Result Date: 05/06/2017 CLINICAL DATA:  Nausea, generalized weakness, fever, possibly UTI EXAM: CT ABDOMEN AND PELVIS WITHOUT CONTRAST TECHNIQUE: Multidetector CT imaging of the abdomen and pelvis was performed following the standard protocol without IV contrast. COMPARISON:  11/24/2016 FINDINGS: Motion degraded images. Lower chest: Mild dependent atelectasis at the lung bases. Hepatobiliary: Unenhanced liver is unremarkable. Layering gallstones (series 2/ image 30), without associated inflammatory changes. Pancreas: Within normal limits. Spleen: Within normal limits. Adrenals/Urinary Tract: Adrenal glands are within normal limits. 3.0 cm cyst along the posterior right upper kidney (series 2/ image 39). Punctate nonobstructing right upper pole renal calculus (series 2/ image 33). 2.5 cm posterior left upper pole renal cyst (series 2/ image 33). No hydronephrosis. Suspected 2 mm distal left ureteral calculus at the UVJ (series 2/image 32), similar to the prior. Bladder is mildly thick-walled although underdistended. Stomach/Bowel: Stomach is notable for a posterior gastric diverticulum and a tiny hiatal hernia. No evidence of bowel obstruction. Appendix is not discretely visualized. Scattered right colonic diverticulosis, without evidence of diverticulitis. Vascular/Lymphatic: No evidence of abdominal aortic aneurysm. Mild atherosclerotic calcifications of the abdominal aorta and branch vessels. No suspicious abdominopelvic lymphadenopathy. Reproductive: Abnormal, enlarged, heterogeneous appearance of the prostate, new from the prior. Low-density appearance of the right peripheral gland (series 2/ image 83), with associated 6.2 x 5.1 x 7.0 cm low-density/fluid density lesion  centrally within the prostate (series 2/image 77), indenting  the base of the bladder. This appearance favors prostatitis with a periprostatic abscess. Other: No abdominopelvic ascites. Musculoskeletal: Mild degenerative changes of the lumbar spine. IMPRESSION: Motion degraded images. Prostatitis with a suspected 7.0 cm periprostatic abscess, as above. 2 mm distal left ureteral calculus at the UVJ, similar to the prior. Additional punctate nonobstructing right upper pole renal calculus. No hydronephrosis. Bladder is mildly thick-walled although underdistended. Additional ancillary findings as above. These results will be called to the ordering clinician or representative by the Radiologist Assistant, and communication documented in the PACS or zVision Dashboard. Electronically Signed   By: Charline BillsSriyesh  Krishnan M.D.   On: 05/06/2017 20:10    EKG:   Orders placed or performed during the hospital encounter of 05/05/17  . EKG 12-Lead  . EKG 12-Lead  . ED EKG  . ED EKG      Management plans discussed with the patient, family and they are in agreement.  CODE STATUS:     Code Status Orders        Start     Ordered   05/06/17 0617  Full code  Continuous     05/06/17 0616    Code Status History    Date Active Date Inactive Code Status Order ID Comments User Context   01/04/2017 12:04 AM 01/07/2017  6:54 PM Full Code [id number]  Altamese DillingVachhani, Vaibhavkumar, MD Inpatient   11/04/2016 10:09 PM 11/05/2016  5:33 PM Full Code [id number]  Houston SirenSainani, Vivek J, MD Inpatient      TOTAL TIME TAKING CARE OF THIS PATIENT: 45  minutes.   Note: This dictation was prepared with Dragon dictation along with smaller phrase technology. Any transcriptional errors that result from this process are unintentional.   @MEC @  on 05/10/2017 at 1:56 PM  Between 7am to 6pm - Pager - [phone]  After 6pm go to [email or web address] - password [email or web address] [facility]  [facility]  Office  [phone]  CC: Primary care physician;  Marisue IvanLinthavong, Kanhka, MD

## 2017-05-10 NOTE — Care Management Note (Addendum)
Case Management Note  Patient Details  Name: Phillip Osborne MRN: [id number] Date of Birth: [birth year]  Subjective/Objective:      Discussed discharge planning with the weekend CSW, Phillip Osborne, [name]'s live-in friend Phillip Osborne [phone], left message for daughter Phillip Osborne requesting Osborne call-back. Mr Phillip Osborne has mild dementia but reports that he wants to return home after discharge. [facility]-PT is recommending home with home health PT. [name] ,member reported that [facility] Mr Phillip Osborne to his home. Consulted CM Team [employer] CorporationLeader Phillip Osborne. If no one from the family is available to transport Mr Phillip Osborne by midnight, then further discharge arrangements will be made tomorrow by this case manager.              Action/Plan:   Expected Discharge Date:  05/10/17               Expected Discharge Plan:     In-House Referral:     Discharge planning Services   Home with IV antibiotic.   Post Acute Care Choice:   YES Choice offered to:   Mr Phillip Osborne  DME Arranged:    DME Agency:     HH Arranged:   PT, RN, SW HH Agency:   [facility]  Status of Service:     If discussed at [employer] Length of Stay Meetings, dates discussed:    Additional Comments:  Phillip Schaffert A, RN 05/10/2017, 4:01 PM

## 2017-05-10 NOTE — Progress Notes (Addendum)
Made multiple attempts to get in touch with Pt's daughter Milda SmartBenita in regard to who would be picking him up. Daughter in law at bedside called son about coming to pick him up. Spoke with Ramona who is significant other and lives with patient. She voiced that she has no form of transportation to come pick him up. Have spoke with RN case manager as well as Nida BoatmanBrad from [facility] as there is a need to set-up IV antibiotics at time of discharge.

## 2017-05-10 NOTE — Progress Notes (Addendum)
Spoke with significant other and she will be at the home to accept him from EMS. Reviewed discharge instructions and how to empty foley catheter.

## 2017-05-10 NOTE — Discharge Instructions (Signed)
Continue Foley catheter until seen and evaluated by urology in a week Follow-up with primary care physician in 5-7 days Follow-up with urology Dr. Apolinar JunesBrandon in a week Follow-up with infectious disease in 4 weeks

## 2017-05-11 LAB — CULTURE, BLOOD (ROUTINE X 2)
CULTURE: NO GROWTH
CULTURE: NO GROWTH
Special Requests: ADEQUATE

## 2017-05-11 NOTE — Progress Notes (Signed)
[facility] Coord[facility]. Annabella stated Dr. Amado CoeGouru wrote discharge orders earlier in the day. CM arranged for [facility] home [facility] nurse and PT. Daughter Octavio GravesBonita was here earlier and aware patient had discharge orders. Bonita left and did not return. Primary nurse- Abbie made mutliple attempts to [facility] up her father. Octavio GravesBonita never called back. This nurse also called Octavio GravesBonita and left her a voice mail requesting that she call the nursing supervisor back. Octavio GravesBonita never called back. This nurse then contacted Ramona, Mr. Quita SkyeMitchell's live in friend and asked if she new how to get [facility] or if she would be able to come and pick up patient. Ramona said she does not drive.Christella Hartigan. Ramona also said she has not been able to reach [facility] and did not know where she was at. This nurse then called [name], Mr. Quita SkyeMitchell's other daughter who states she is unable to pick him up as she is concerned about getting him out of car and up steps. Dr. Amado CoeGouru notified. On-call Social Worker, Dalene SeltzerBrittany Melvin paged and returned call. Made her aware of situation. She planned to [facility] and file report for follow up. Dr. Amado CoeGouru  ordered to transfer patient home via EMS. Arrangements made. Ramona, significant other will be at home to accept patient.

## 2017-05-12 ENCOUNTER — Ambulatory Visit: Payer: Medicare Other

## 2017-05-18 NOTE — Progress Notes (Deleted)
05/19/2017 9:16 PM   Phillip Osborne 12/12/1931 [id number]  Referring provider: Marisue Ivan, MD [phone] [facility] [address] [facility] [place], [place] [address]  No chief complaint on file.   HPI: 83 yo AAM with a history of nephrolithiasis who presents today complaining of pain which may be due to a stone.  Patient was last seen by [place] in 05/2016 after undergoing ESWL for a left UVJ stone.    CT Renal stone study performed in 11/2016 noted In the upper pole collecting system of the right kidney there is a 3 mm nonobstructive calculus. No additional calculi are noted within the left renal collecting system, or along the course of either ureter. In the urinary bladder there is a 4 mm calculus lying dependently on the left side. Multiple well-defined low-attenuation lesions are noted in the kidneys bilaterally, incompletely characterized on today's noncontrast CT examination, but previously characterized as simple cysts. 1 additional lesion in the interpolar region of the left kidney measures 2.5 cm in diameter and is low-intermediate attenuation (24 HU), unchanged, presumably a mildly proteinaceous cyst. Unenhanced appearance of the adrenal glands and urinary bladder is otherwise unremarkable.  Today, he is experiencing frequency, dysuria, nocturia, intermittency, hesitancy and straining.to urinate.  He has also been experiencing back pain that radiates to his penis.  This has been occurring for the last two weeks.  He states the pain is 10/10.  He describes it as sharp.  He is also constipated.  He denies fevers, chills, nausea and vomiting.  His UA was unremarkable.  His PVR 84 mL.     PMH: Past Medical History:  Diagnosis Date  . Diabetes mellitus without complication (HCC)   . Gout   . Hypertension   . Kidney stone   . Seizures (HCC)    last seizure about 1 year ago(2017)  . Thyroid disease     Surgical History: Past Surgical History:  Procedure Laterality Date    . CYSTOSCOPY/URETEROSCOPY/HOLMIUM LASER/STENT PLACEMENT Left 05/07/2017   Procedure: CYSTOSCOPY/URETEROSCOPY/HOLMIUM LASER/STENT PLACEMENT;  Surgeon: Vanna Scotland, MD;  Location: [facility] ORS;  Service: Urology;  Laterality: Left;  . EXTRACORPOREAL SHOCK WAVE LITHOTRIPSY Left 02/22/2016   Procedure: EXTRACORPOREAL SHOCK WAVE LITHOTRIPSY (ESWL);  Surgeon: Hildred Laser, MD;  Location: [facility] OR[facility];  Service: Urology;  Laterality: Left;  . EYE SURGERY Left    cataract surgery  . HERNIA REPAIR  70's  . TRANSURETHRAL RESECTION OF PROSTATE N/A 05/07/2017   Procedure: TRANSURETHRAL RESECTION OF THE PROSTATE (TURP);  Surgeon: Vanna Scotland, MD;  Location: [facility];  Service: Urology;  Laterality: N/A;    Home Medications:  Allergies as of 05/19/2017      Reactions   Enalapril Swelling   Face swelling.   Lisinopril Swelling   mouth      Medication List       Accurate as of 05/18/17  9:16 PM. Always use your most recent med list.          ACCU-CHEK AVIVA test strip Generic drug:  glucose blood 1 each by Other route 3 (three) times daily. (Fasting and 2 hours after largest meal)   acetaminophen 325 MG tablet Commonly known as:  TYLENOL Take 1 tablet (325 mg total) by mouth every 6 (six) hours as needed for mild pain (or Fever >/= 101).   atorvastatin 20 MG tablet Commonly known as:  LIPITOR Take 20 mg by mouth at bedtime.   docusate sodium 100 MG capsule Commonly known as:  COLACE Take 1  capsule (100 mg total) by mouth 2 (two) times daily as needed for mild constipation.   donepezil 5 MG tablet Commonly known as:  ARICEPT Take 5 mg by mouth at bedtime.   ertapenem 0.5 g in sodium chloride 0.9 % 50 mL Inject 0.5 g into the vein daily.   febuxostat 40 MG tablet Commonly known as:  ULORIC Take 40 mg by mouth daily.   ferrous sulfate 325 (65 FE) MG tablet Take 325 mg by mouth daily.   folic acid 1 MG tablet Commonly known as:  FOLVITE Take 1 mg by mouth daily.    gabapentin 300 MG capsule Commonly known as:  NEURONTIN Take 300 mg by mouth 3 (three) times daily.   HYDROcodone-acetaminophen 5-325 MG tablet Commonly known as:  NORCO/VICODIN Take 1 tablet by mouth every 6 (six) hours as needed for moderate pain.   insulin glargine 100 UNIT/ML injection Commonly known as:  LANTUS Inject 0.3 mLs (30 Units total) into the skin daily.   insulin lispro 100 UNIT/ML injection Commonly known as:  HUMALOG Inject 5 Units into the skin 3 (three) times daily before meals. (Hold if CBG is less than 100)   lamoTRIgine 25 MG tablet Commonly known as:  LAMICTAL Take 1 tablet (25 mg total) by mouth daily.   levETIRAcetam 500 MG tablet Commonly known as:  KEPPRA Take 500 mg by mouth 2 (two) times daily.   levothyroxine 75 MCG tablet Commonly known as:  SYNTHROID, LEVOTHROID Take 75 mcg by mouth daily.   patiromer 8.4 g packet Commonly known as:  VELTASSA Take 1 packet (8.4 g total) by mouth daily.   sodium bicarbonate 650 MG tablet Take 1,300 mg by mouth 3 (three) times daily.       Allergies:  Allergies  Allergen Reactions  . Enalapril Swelling    Face swelling.  . Lisinopril Swelling    mouth    Family History: Family History  Problem Relation Age of Onset  . Cancer Mother   . Diabetes Sister   . Kidney disease Neg Hx   . Prostate cancer Neg Hx   . Kidney cancer Neg Hx   . Bladder Cancer Neg Hx     Social History:  reports that he has never smoked. He has never used smokeless tobacco. He reports that he does not drink alcohol or use drugs.  ROS:                                        Physical Exam: There were no vitals taken for this visit.  Constitutional: Well nourished. Alert and oriented, No acute distress. HEENT: [place] AT, moist mucus membranes. Trachea midline, no masses. Cardiovascular: No clubbing, cyanosis, or edema. Respiratory: Normal respiratory effort, no increased work of breathing. GI:  Abdomen is soft, non tender, non distended, no abdominal masses. Liver and spleen not palpable.  No hernias appreciated.  Stool sample for occult testing is not indicated.   GU: No CVA tenderness.  No bladder fullness or masses.  Patient with uncircumcised phallus. Foreskin easily retracted  Urethral meatus is patent.  No penile discharge. No penile lesions or rashes. Vitiligo present.  Scrotum without lesions, cysts, rashes and/or edema.  Testicles are located scrotally bilaterally. No masses are appreciated in the testicles. Left and right epididymis are normal. Rectal: Patient with  normal sphincter tone. Anus and perineum without scarring or rashes. No rectal masses  are appreciated. Prostate is approximately 70  grams, no nodules are appreciated. Seminal vesicles are normal. Skin: No rashes, bruises or suspicious lesions. Lymph: No cervical or inguinal adenopathy. Neurologic: Grossly intact, no focal deficits, moving all 4 extremities. Psychiatric: Normal mood and affect.  Laboratory Data: Lab Results  Component Value Date   WBC 12.1 (H) 05/09/2017   HGB 9.2 (L) 05/09/2017   HCT 28.2 (L) 05/09/2017   MCV 86.1 05/09/2017   PLT 253 05/09/2017    Lab Results  Component Value Date   CREATININE 2.01 (H) 05/09/2017    Lab Results  Component Value Date   HGBA1C 7.6 (H) 05/05/2017       Component Value Date/Time   CHOL 121 05/10/2013 0415   HDL 60 05/10/2013 0415   VLDL 24 05/10/2013 0415   LDLCALC 37 05/10/2013 0415    Lab Results  Component Value Date   AST 20 05/07/2017   Lab Results  Component Value Date   ALT 15 (L) 05/07/2017    Urinalysis Unremarkable.  See EPIC.   I have independently reviewed the labs.  Pertinent Imaging: CLINICAL DATA:  Right groin and flank pain for 1 week. History kidney stones. Dysuria. Prior hernia repair.  EXAM: CT ABDOMEN AND PELVIS WITHOUT CONTRAST  TECHNIQUE: Multidetector CT imaging of the abdomen and pelvis was  performed following the standard protocol without IV contrast.  COMPARISON:  02/13/2016 CT.  Renal ultrasound of 05/27/2016.  FINDINGS: Lower chest: Emphysema at the lung bases. Left base opacity is new since the prior exam, including on image 14/series 4. Mild cardiomegaly. Mild bilateral gynecomastia.  Hepatobiliary: Normal liver. Normal gallbladder, without biliary ductal dilatation.  Pancreas: Normal, without mass or ductal dilatation.  Spleen: Normal in size, without focal abnormality.  Adrenals/Urinary Tract: Normal adrenal glands. Mild renal cortical thinning bilaterally. Lower pole 2.7 cm right renal cyst. A lower pole 14 mm left renal low-density lesion is likely a cyst.  Posterior interpolar left renal lesion measures 2.8 cm and minimally greater than fluid density. This is present back to 04/27/13, where it measured 2.2 cm.  Punctate right renal collecting system calculus, without hydronephrosis or hydroureter. Moderately thickened bladder wall with mild pericystic edema. Left-sided bladder base stone measures 5 mm.  Stomach/Bowel: Gastric diverticulum again identified posteriorly. Otherwise normal stomach. Normal colon, appendix, and terminal ileum. Normal small bowel.  Vascular/Lymphatic: Aortic and branch vessel atherosclerosis. No abdominopelvic adenopathy.  Reproductive: Mild prostatomegaly.  Other: No significant free fluid.  Musculoskeletal: No acute osseous abnormality.  IMPRESSION: 1. Right nephrolithiasis, without obstructive uropathy. 2. Left-sided bladder stone. 3. Prostatomegaly. Concurrent bladder wall thickening and mild pericystic edema. Findings are grossly similar to 02/13/2016. The bladder findings could be related to bladder outlet obstruction or cystitis. 4. Bilateral renal low-density lesions are likely cysts. An interpolar left renal lesion measures slightly greater than fluid density, and has enlarged minimally since  04/27/2013. Favor a complex cyst. Technically indeterminate. Consider definitive characterization with pre and post contrast abdominal MRI. As an interpolar left renal lesion in this region appeared simple on the prior ultrasound (favored to represent the same lesion) ultrasound surveillance at 6-12 months would be a reasonable strategy. 5. New left base opacity since 02/13/2016. Favored to represent an area of infection or aspiration. Correlate with chest symptoms. Consider chest CT followup at 3- 6 months to confirm stability or resolution.   Electronically Signed   By: Jeronimo Greaves M.D.   On: 11/04/2016 17:23  CLINICAL DATA:  83 year old male with history of  right-sided groin and flank pain for the past couple weeks. History of urinary tract calculi.  EXAM: CT ABDOMEN AND PELVIS WITHOUT CONTRAST  TECHNIQUE: Multidetector CT imaging of the abdomen and pelvis was performed following the standard protocol without IV contrast.  COMPARISON:  CT the abdomen and pelvis 11/04/2016.  FINDINGS: Lower chest: Previously noted focus of airspace consolidation in the left lower lobe has resolved. There are peripheral areas of ground-glass attenuation and subpleural reticulation concerning for developing interstitial lung disease.  Hepatobiliary: No definite cystic or solid hepatic lesions are identified on today's noncontrast CT examination. Unenhanced appearance of the gallbladder is normal.  Pancreas: No pancreatic mass or peripancreatic inflammatory changes are noted on today's noncontrast CT examination.  Spleen: Unremarkable.  Adrenals/Urinary Tract: In the upper pole collecting system of the right kidney there is a 3 mm nonobstructive calculus. No additional calculi are noted within the left renal collecting system, or along the course of either ureter. In the urinary bladder there is a 4 mm calculus lying dependently on the left side. Multiple  well-defined low-attenuation lesions are noted in the kidneys bilaterally, incompletely characterized on today's noncontrast CT examination, but previously characterized as simple cysts. 1 additional lesion in the interpolar region of the left kidney measures 2.5 cm in diameter and is low-intermediate attenuation (24 HU), unchanged, presumably a mildly proteinaceous cyst. Unenhanced appearance of the adrenal glands and urinary bladder is otherwise unremarkable.  Stomach/Bowel: Small gastric diverticulum extending from the cardia of the stomach posteriorly. Remainder of the stomach is otherwise unremarkable in appearance. There is no pathologic dilatation of small bowel or colon. A few scattered colonic diverticulae are noted, without surrounding inflammatory changes to suggest an acute diverticulitis at this time. Normal appendix.  Vascular/Lymphatic: Atherosclerotic calcifications throughout the abdominal aorta, without evidence of definite aneurysm in the abdominal or pelvic vasculature. No lymphadenopathy noted in the abdomen or pelvis.  Reproductive: Prostate gland and seminal vesicles are unremarkable in appearance.  Other: No significant volume of ascites.  No pneumoperitoneum.  Musculoskeletal: There are no aggressive appearing lytic or blastic lesions noted in the visualized portions of the skeleton.  IMPRESSION: 1. 3 mm nonobstructive calculus in the upper pole collecting system of the right kidney and 4 mm nonobstructive calculus in the urinary bladder appears similar to the prior study. No ureteral stones or findings of urinary tract obstruction are noted at this time. 2. Mild colonic diverticulosis without evidence of acute diverticulitis at this time. 3. Aortic atherosclerosis. 4. Findings in the visualize lung bases suggests underlying interstitial lung disease. This could be better evaluated with followup nonemergent high-resolution chest CT in the next  6-12 months to assess for temporal changes in the appearance of the lung parenchyma. 5. Previously noted small focus of airspace consolidation in the left lower lobe has resolved, indicative of an infectious or inflammatory etiology on the prior examination.   Electronically Signed   By: Trudie Reed M.D.   On: 11/24/2016 10:29  I have independently reviewed the films.    Assessment & Plan:    1. Dysuria  - UA is unremarkable  - May be due to ureteral stone - we'll obtain CT renal stone study for further evaluation  - Advised to contact our office or seek treatment in the ED if becomes febrile or pain/ vomiting are difficult control in order to arrange for emergent/urgent intervention  2. Flank pain  - may be due to stones-CT scan is pending  3. Constipation  - cause of pain  vs result from stone - CT scan is pending  4. Renal lesions  - most likely cysts - last CT 6 months ago will compare with new CT once available  5. Left renal lesion  - 2.5 cm presumed to be a proteinaceous cyst  - See above    No Follow-up on file.  These notes generated with voice recognition software. I apologize for typographical errors.  Michiel Cowboy, PA-C  [facility] [address] [place], [place] [address] [phone]

## 2017-05-19 ENCOUNTER — Ambulatory Visit: Payer: Medicare Other | Admitting: Urology

## 2017-05-22 ENCOUNTER — Encounter: Payer: Self-pay | Admitting: Urology

## 2017-05-22 ENCOUNTER — Ambulatory Visit: Payer: Medicare Other | Admitting: Urology

## 2017-05-22 ENCOUNTER — Ambulatory Visit (INDEPENDENT_AMBULATORY_CARE_PROVIDER_SITE_OTHER): Payer: Medicare Other | Admitting: Urology

## 2017-05-22 VITALS — BP 131/77 | HR 85 | Ht 65.0 in | Wt 167.0 lb

## 2017-05-22 DIAGNOSIS — B3749 Other urogenital candidiasis: Secondary | ICD-10-CM

## 2017-05-22 DIAGNOSIS — N412 Abscess of prostate: Secondary | ICD-10-CM

## 2017-05-22 DIAGNOSIS — N472 Paraphimosis: Secondary | ICD-10-CM

## 2017-05-22 DIAGNOSIS — N201 Calculus of ureter: Secondary | ICD-10-CM

## 2017-05-22 MED ORDER — CIPROFLOXACIN HCL 500 MG PO TABS: 500.0000 mg | ORAL_TABLET | Freq: Once | ORAL | Status: DC

## 2017-05-22 MED ORDER — LIDOCAINE HCL 2 % EX GEL
1.0000 "application " | Freq: Once | CUTANEOUS | Status: AC
  Administered 2017-05-22: 1 via URETHRAL

## 2017-05-23 NOTE — Progress Notes (Signed)
   05/23/17  CC:  Chief Complaint  Patient presents with  . Cysto Stent Removal    HPI: 83 year old male with a large prostatic abscess and left distal ureteral stone who underwent transurethral unroofing of his prostatic abscess as well as left distal ureteral calculus status post ureteroscopy on 05/07/2017. He returns today for cystoscopy, stent removal and voiding trial. He was discharged home on IV antibiotics which she continues at this time.   Overall, he reports pain and discomfort in his penis from his Foley catheter, otherwise is doing well. He has some weakness and difficulty ambulating which he relates to his catheter.  Blood pressure 131/77, pulse 85, height 5\' 5"  (1.651 m), weight 167 lb (75.8 kg). NED. A&Ox3.   No respiratory distress   Abd soft, NT, ND Uncircumcised phallus with significant paraphimosis which was able to be reduced at bedside today Healed erythematous crashing groin appreciated  Cystoscopy/ Stent removal procedure  Patient identification was confirmed, informed consent was obtained, and patient was prepped using Betadine solution.  Lidocaine jelly was administered per urethral meatus.    Preoperative abx where received prior to procedure.    Procedure: - Flexible cystoscope introduced, without any difficulty.   - Thorough search of the bladder revealed:    normal urethral meatus    Intact prostatic urethra without any large filling defect.  Stent seen emanating from left ureteral orifice, grasped with stent graspers, and removed in entirety.     Post-Procedure: - Patient tolerated the procedure well  Assessment/ Plan:  1. Prostate abscess Status post unroofing Continue IV antibiotics per ID Cystoscopy reveals well-healed prostatic fossa Successful voiding trial today - lidocaine (XYLOCAINE) 2 % jelly 1 application; Place 1 application into the urethra once. - US Renal; Future  2. Left ureteral stone Status post left stent removal    follow-up in 4 weeks to rule out iatrogenic hydro-nephrosis During symptoms reviewed - US Renal; Future  3. Paraphimosis Reduced, education provided  4. Candida infection of genital region Recommended OTC antifungal powder Return if not improved  Return in about 4 weeks (around 06/19/2017) for f/u RUS.   Phillip Scotland, MD

## 2017-06-19 ENCOUNTER — Ambulatory Visit
Admission: RE | Admit: 2017-06-19 | Discharge: 2017-06-19 | Disposition: A | Discharge status: Home / Self Care | Payer: Medicare Other | Source: Ambulatory Visit | Attending: Urology | Admitting: Urology

## 2017-06-19 DIAGNOSIS — N412 Abscess of prostate: Secondary | ICD-10-CM | POA: Diagnosis not present

## 2017-06-19 DIAGNOSIS — N281 Cyst of kidney, acquired: Secondary | ICD-10-CM | POA: Diagnosis not present

## 2017-06-19 DIAGNOSIS — N201 Calculus of ureter: Secondary | ICD-10-CM | POA: Insufficient documentation

## 2017-06-25 ENCOUNTER — Ambulatory Visit (INDEPENDENT_AMBULATORY_CARE_PROVIDER_SITE_OTHER): Payer: Medicare Other | Admitting: Urology

## 2017-06-25 ENCOUNTER — Encounter: Payer: Self-pay | Admitting: Urology

## 2017-06-25 VITALS — BP 150/66 | HR 87 | Ht 65.0 in | Wt 166.0 lb

## 2017-06-25 DIAGNOSIS — Z87442 Personal history of urinary calculi: Secondary | ICD-10-CM

## 2017-06-25 DIAGNOSIS — N412 Abscess of prostate: Secondary | ICD-10-CM

## 2017-06-25 NOTE — Progress Notes (Signed)
06/25/2017 9:46 AM   Phillip Osborne 08-13-1932 [id number]  Referring provider: Marisue Ivan, MD [phone] [facility] [address] [facility] [place], [place] [address]  Chief Complaint  Patient presents with  . Nephrolithiasis    1 month w/RUS    HPI: 83 year old male with a large prostatic abscess and left distal ureteral stone who underwent transurethral unroofing of his prostatic abscess as well as left distal ureteral calculus status post ureteroscopy on 05/07/2017. He returnedfor cystoscopy, stent removal and voiding trial on 05/22/17.  Completed a prolonged course of IV ertapenem via PICC line and is now on Macrobid 100 mg twice a day for an additional 3 weeks per ID.  Repeat urinalysis yesterday performed by Dr. Sampson Goon was completely negative.  Today, he reports that he is doing well. He does have some postvoid dribbling but otherwise his urinary symptoms are back to baseline. No hematuria, dysuria, urgency, or frequency. No fevers or chills. His back pain is also resolved.  He does have a personal history of nephrolithiasis. He's had 2 previous stone episodes prior to this one.  Follow-up renal ultrasound shows no residual hydronephrosis bilaterally and no residual prostatic abscess.   PMH: Past Medical History:  Diagnosis Date  . Diabetes mellitus without complication (HCC)   . Gout   . Hypertension   . Kidney stone   . Seizures (HCC)    last seizure about 1 year ago(2017)  . Thyroid disease     Surgical History: Past Surgical History:  Procedure Laterality Date  . CYSTOSCOPY/URETEROSCOPY/HOLMIUM LASER/STENT PLACEMENT Left 05/07/2017   Procedure: CYSTOSCOPY/URETEROSCOPY/HOLMIUM LASER/STENT PLACEMENT;  Surgeon: Vanna Scotland, MD;  Location: [facility] ORS;  Service: Urology;  Laterality: Left;  . EXTRACORPOREAL SHOCK WAVE LITHOTRIPSY Left 02/22/2016   Procedure: EXTRACORPOREAL SHOCK WAVE LITHOTRIPSY (ESWL);  Surgeon: Hildred Laser, MD;  Location: [facility]  ORS;  Service: Urology;  Laterality: Left;  . EYE SURGERY Left    cataract surgery  . HERNIA REPAIR  70's  . TRANSURETHRAL RESECTION OF PROSTATE N/A 05/07/2017   Procedure: TRANSURETHRAL RESECTION OF THE PROSTATE (TURP);  Surgeon: Vanna Scotland, MD;  Location: [facility] ORS;  Service: Urology;  Laterality: N/A;    Home Medications:  Allergies as of 06/25/2017      Reactions   Enalapril Swelling   Face swelling.   Lisinopril Swelling   mouth      Medication List       Accurate as of 06/25/17  9:46 AM. Always use your most recent med list.          ACCU-CHEK AVIVA test strip Generic drug:  glucose blood 1 each by Other route 3 (three) times daily. (Fasting and 2 hours after largest meal)   acetaminophen 325 MG tablet Commonly known as:  TYLENOL Take 1 tablet (325 mg total) by mouth every 6 (six) hours as needed for mild pain (or Fever >/= 101).   atorvastatin 20 MG tablet Commonly known as:  LIPITOR Take 20 mg by mouth at bedtime.   docusate sodium 100 MG capsule Commonly known as:  COLACE Take 1 capsule (100 mg total) by mouth 2 (two) times daily as needed for mild constipation.   donepezil 5 MG tablet Commonly known as:  ARICEPT Take 5 mg by mouth at bedtime.   ertapenem 0.5 g in sodium chloride 0.9 % 50 mL Inject 0.5 g into the vein daily.   ertapenem 1 g injection Commonly known as:  INVANZ Inject into the vein.   febuxostat 40 MG tablet Commonly  known as:  ULORIC Take 40 mg by mouth daily.   ferrous sulfate 325 (65 FE) MG tablet Take 325 mg by mouth daily.   folic acid 1 MG tablet Commonly known as:  FOLVITE Take 1 mg by mouth daily.   gabapentin 300 MG capsule Commonly known as:  NEURONTIN Take 300 mg by mouth 3 (three) times daily.   HYDROcodone-acetaminophen 5-325 MG tablet Commonly known as:  NORCO/VICODIN Take 1 tablet by mouth every 6 (six) hours as needed for moderate pain.   insulin glargine 100 UNIT/ML injection Commonly known as:   LANTUS Inject 0.3 mLs (30 Units total) into the skin daily.   insulin lispro 100 UNIT/ML injection Commonly known as:  HUMALOG Inject 5 Units into the skin 3 (three) times daily before meals. (Hold if CBG is less than 100)   lamoTRIgine 25 MG tablet Commonly known as:  LAMICTAL Take 1 tablet (25 mg total) by mouth daily.   levETIRAcetam 500 MG tablet Commonly known as:  KEPPRA Take by mouth.   levothyroxine 75 MCG tablet Commonly known as:  SYNTHROID, LEVOTHROID Take 75 mcg by mouth daily.   nitrofurantoin (macrocrystal-monohydrate) 100 MG capsule Commonly known as:  MACROBID Take by mouth.   patiromer 8.4 g packet Commonly known as:  VELTASSA Take 1 packet (8.4 g total) by mouth daily.   sodium bicarbonate 650 MG tablet Take 1,300 mg by mouth 3 (three) times daily.   tamsulosin 0.4 MG Caps capsule Commonly known as:  FLOMAX Take 0.4 mg by mouth.       Allergies:  Allergies  Allergen Reactions  . Enalapril Swelling    Face swelling.  . Lisinopril Swelling    mouth    Family History: Family History  Problem Relation Age of Onset  . Cancer Mother   . Diabetes Sister   . Kidney disease Neg Hx   . Prostate cancer Neg Hx   . Kidney cancer Neg Hx   . Bladder Cancer Neg Hx     Social History:  reports that he has never smoked. He has never used smokeless tobacco. He reports that he does not drink alcohol or use drugs.  ROS: UROLOGY Frequent Urination?: No Hard to postpone urination?: No Burning/pain with urination?: No Get up at night to urinate?: No Leakage of urine?: Yes Urine stream starts and stops?: No Trouble starting stream?: No Do you have to strain to urinate?: No Blood in urine?: No Urinary tract infection?: No Sexually transmitted disease?: No Injury to kidneys or bladder?: No Painful intercourse?: No Weak stream?: No Erection problems?: No Penile pain?: No  Gastrointestinal Nausea?: No Vomiting?: No Indigestion/heartburn?:  No Diarrhea?: No Constipation?: No  Constitutional Fever: No Night sweats?: No Weight loss?: No Fatigue?: No  Skin Skin rash/lesions?: No Itching?: No  Eyes Blurred vision?: No Double vision?: No  Ears/Nose/Throat Sore throat?: No Sinus problems?: No  Hematologic/Lymphatic Swollen glands?: No Easy bruising?: No  Cardiovascular Leg swelling?: No Chest pain?: No  Respiratory Cough?: No Shortness of breath?: Yes  Endocrine Excessive thirst?: No  Musculoskeletal Back pain?: No Joint pain?: No  Neurological Headaches?: No Dizziness?: No  Psychologic Depression?: No Anxiety?: No  Physical Exam: BP (!) 150/66   Pulse 87   Ht  (1.651 m)   Wt 166 lb (75.3 kg)   BMI 27.62 kg/m   Constitutional:  Alert and oriented, No acute distress. HEENT: [place] AT, moist mucus membranes.  Trachea midline, no masses. Cardiovascular: No clubbing, cyanosis, or edema. Respiratory: Normal  respiratory effort, no increased work of breathing. GI: Abdomen is soft, nontender, nondistended, no abdominal masses GU: No CVA tenderness.  Skin: No rashes, bruises or suspicious lesions. Neurologic: Grossly intact, no focal deficits, moving all 4 extremities.  Ambulating with cane.   Psychiatric: Normal mood and affect.  Laboratory Data: Lab Results  Component Value Date   WBC 12.1 (H) 05/09/2017   HGB 9.2 (L) 05/09/2017   HCT 28.2 (L) 05/09/2017   MCV 86.1 05/09/2017   PLT 253 05/09/2017    Lab Results  Component Value Date   CREATININE 2.01 (H) 05/09/2017    Lab Results  Component Value Date   HGBA1C 7.6 (H) 05/05/2017    Urinalysis UA from 9/17 negative  Pertinent Imaging: CLINICAL DATA:  Recent left ureteral calculus. Recent prostate abscess.  EXAM: RENAL / URINARY TRACT ULTRASOUND COMPLETE  COMPARISON:  CT abdomen and pelvis May 06, 2017  FINDINGS: Right Kidney:  Length: 10.5 cm. Echogenicity within normal limits. No perinephric fluid or  hydronephrosis visualized. There is a cyst in the mid to lower pole region measuring 2.5 x 2.6 x 2.4 cm. There is no sonographically demonstrable calculus or ureterectasis.  Left Kidney:  Length: 9.2 cm. Echogenicity and renal cortical thickness are within normal limits. No perinephric fluid or hydronephrosis visualized. There is a cyst in the upper pole region on the left measuring 2.6 x 2.9 x 2.7 cm. No sonographically demonstrable calculus or ureterectasis.  Bladder:  Appears normal for degree of bladder distention.  Prostate measures 3.8 x 3.8 x 4.2 cm. Prostate has an inhomogeneous echotexture pattern.  IMPRESSION: 1. There is a cyst in each kidney. Kidneys otherwise appear unremarkable. In particular, no evidence of hydronephrosis on either side.  2. Prostate appears smaller compared to findings on prior CT. Prostate has an inhomogeneous appearance which may be due to underlying prostatitis. Homero Fellers prostate abscess seen on prior CT is not appreciable by transabdominal ultrasound.   Electronically Signed   By: Bretta Bang III M.D.   On: 06/19/2017 10:53  Renal ultrasound personally reviewed today. This is compared to previous noncontrast CT scan on 05/06/2017, prostate abscess though improved/resolving.  Assessment & Plan:    1. Prostate abscess Resolved s/p unroofing Renal bladder ultrasound today reassuring Continue antibiotics per infectious disease Minimal residual voiding issues, recheck in 6 months  2. History of kidney stones We discussed general stone prevention techniques including drinking plenty water with goal of producing 2.5 L urine daily, increased citric acid intake, avoidance of high oxalate containing foods, and decreased salt intake.  Information about dietary recommendations given today.     Return in about 6 months (around 12/23/2017) for IPSS, PVR, UA.  Vanna Scotland, MD  [facility] [address] [place], [place] [address] [phone]

## 2017-06-25 NOTE — Patient Instructions (Signed)
Dietary Guidelines to Help Prevent Kidney Stones Kidney stones are deposits of minerals and salts that form inside your kidneys. Your risk of developing kidney stones may be greater depending on your diet, your lifestyle, the medicines you take, and whether you have certain medical conditions. Most people can reduce their chances of developing kidney stones by following the instructions below. Depending on your overall health and the type of kidney stones you tend to develop, your dietitian may give you more specific instructions. What are tips for following this plan? Reading food labels   Choose foods with "no salt added" or "low-salt" labels. Limit your sodium intake to less than 1500 mg per day.  Choose foods with calcium for each meal and snack. Try to eat about 300 mg of calcium at each meal. Foods that contain 200-500 mg of calcium per serving include:  8 oz (237 ml) of milk, fortified nondairy milk, and fortified fruit juice.  8 oz (237 ml) of kefir, yogurt, and soy yogurt.  4 oz (118 ml) of tofu.  1 oz of cheese.  1 cup (300 g) of dried figs.  1 cup (91 g) of cooked broccoli.  1-3 oz can of sardines or mackerel.  Most people need 1000 to 1500 mg of calcium each day. Talk to your dietitian about how much calcium is recommended for you. Shopping   Buy plenty of fresh fruits and vegetables. Most people do not need to avoid fruits and vegetables, even if they contain nutrients that may contribute to kidney stones.  When shopping for convenience foods, choose:  Whole pieces of fruit.  Premade salads with dressing on the side.  Low-fat fruit and yogurt smoothies.  Avoid buying frozen meals or prepared deli foods.  Look for foods with live cultures, such as yogurt and kefir. Cooking   Do not add salt to food when cooking. Place a salt shaker on the table and allow each person to add his or her own salt to taste.  Use vegetable protein, such as beans, textured vegetable  protein (TVP), or tofu instead of meat in pasta, casseroles, and soups. Meal planning   Eat less salt, if told by your dietitian. To do this:  Avoid eating processed or premade food.  Avoid eating fast food.  Eat less animal protein, including cheese, meat, poultry, or fish, if told by your dietitian. To do this:  Limit the number of times you have meat, poultry, fish, or cheese each week. Eat a diet free of meat at least 2 days a week.  Eat only one serving each day of meat, poultry, fish, or seafood.  When you prepare animal protein, cut pieces into small portion sizes. For most meat and fish, one serving is about the size of one deck of cards.  Eat at least 5 servings of fresh fruits and vegetables each day. To do this:  Keep fruits and vegetables on hand for snacks.  Eat 1 piece of fruit or a handful of berries with breakfast.  Have a salad and fruit at lunch.  Have two kinds of vegetables at dinner.  Limit foods that are high in a substance called oxalate. These include:  Spinach.  Rhubarb.  Beets.  Potato chips and french fries.  Nuts.  If you regularly take a diuretic medicine, make sure to eat at least 1-2 fruits or vegetables high in potassium each day. These include:  Avocado.  Banana.  Orange, prune, carrot, or tomato juice.  Baked potato.  Cabbage.    Beans and split peas. General instructions   Drink enough fluid to keep your urine clear or pale yellow. This is the most important thing you can do.  Talk to your health care provider and dietitian about taking daily supplements. Depending on your health and the cause of your kidney stones, you may be advised:  Not to take supplements with vitamin C.  To take a calcium supplement.  To take a daily probiotic supplement.  To take other supplements such as magnesium, fish oil, or vitamin B6.  Take all medicines and supplements as told by your health care provider.  Limit alcohol intake to no  more than 1 drink a day for nonpregnant women and 2 drinks a day for men. One drink equals 12 oz of beer, 5 oz of wine, or 1 oz of hard liquor.  Lose weight if told by your health care provider. Work with your dietitian to find strategies and an eating plan that works best for you. What foods are not recommended? Limit your intake of the following foods, or as told by your dietitian. Talk to your dietitian about specific foods you should avoid based on the type of kidney stones and your overall health. Grains  Breads. Bagels. Rolls. Baked goods. Salted crackers. Cereal. Pasta. Vegetables  Spinach. Rhubarb. Beets. Canned vegetables. Pickles. Olives. Meats and other protein foods  Nuts. Nut butters. Large portions of meat, poultry, or fish. Salted or cured meats. Deli meats. Hot dogs. Sausages. Dairy  Cheese. Beverages  Regular soft drinks. Regular vegetable juice. Seasonings and other foods  Seasoning blends with salt. Salad dressings. Canned soups. Soy sauce. Ketchup. Barbecue sauce. Canned pasta sauce. Casseroles. Pizza. Lasagna. Frozen meals. Potato chips. French fries. Summary  You can reduce your risk of kidney stones by making changes to your diet.  The most important thing you can do is drink enough fluid. You should drink enough fluid to keep your urine clear or pale yellow.  Ask your health care provider or dietitian how much protein from animal sources you should eat each day, and also how much salt and calcium you should have each day. This information is not intended to replace advice given to you by your health care provider. Make sure you discuss any questions you have with your health care provider. Document Released: 01/18/2011 Document Revised: 09/03/2016 Document Reviewed: 09/03/2016 Elsevier Interactive Patient Education  2017 [employer]  

## 2017-12-17 ENCOUNTER — Ambulatory Visit: Admit: 2017-12-17 | Payer: Medicare Other | Admitting: Ophthalmology

## 2017-12-17 SURGERY — PHACOEMULSIFICATION, CATARACT, WITH IOL INSERTION
Anesthesia: Choice | Laterality: Left

## 2017-12-23 ENCOUNTER — Encounter: Payer: Self-pay | Admitting: Urology

## 2017-12-23 ENCOUNTER — Ambulatory Visit: Payer: Medicare Other | Admitting: Urology

## 2017-12-29 IMAGING — CT CT RENAL STONE PROTOCOL
2 of 4 series · 15 of 46 positions shown, 17 images · non-contrast
Comparison: 02/13/2016 CT.  Renal ultrasound of 05/27/2016.

CLINICAL DATA: Right groin and flank pain for 1 week. History
kidney stones. Dysuria. Prior hernia repair.

EXAM:
CT ABDOMEN AND PELVIS WITHOUT CONTRAST
TECHNIQUE: Multidetector CT imaging of the abdomen and pelvis was performed
following the standard protocol without IV contrast.

[Series 2: stone full standard · axial · 0.74mm/px · z∈[-477,-67]mm · 12 of 90 slices shown, 14 images]
[im 4/90  soft-tissue]
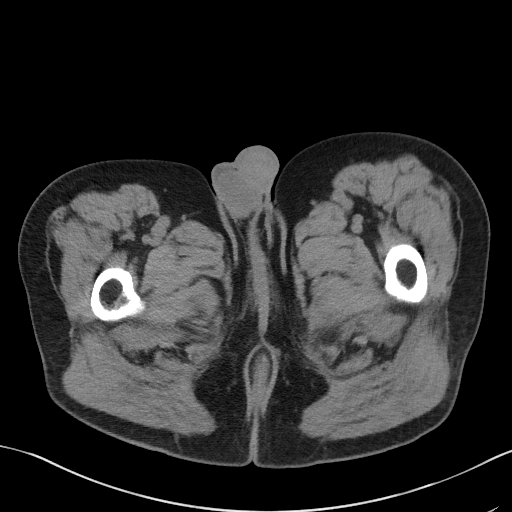
[im 4/90  bone]
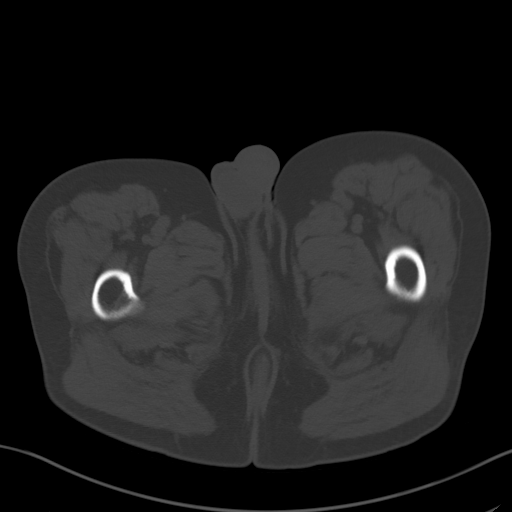
[im 12/90  soft-tissue]
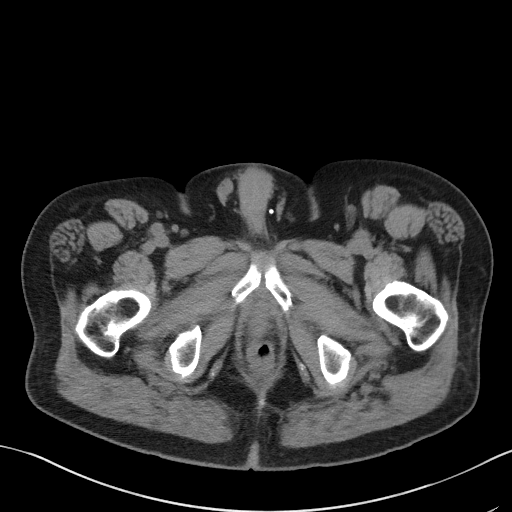
[im 19/90  soft-tissue]
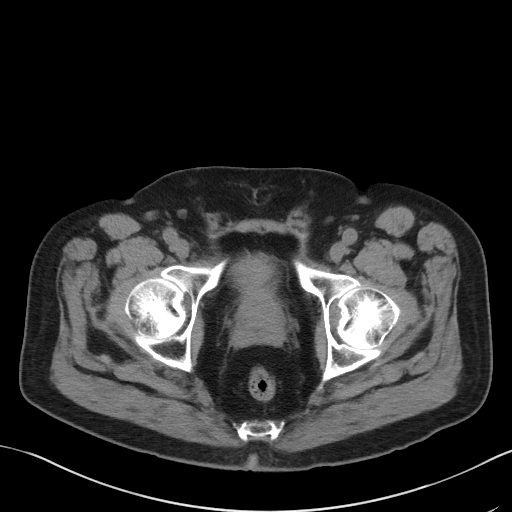
[im 26/90  soft-tissue]
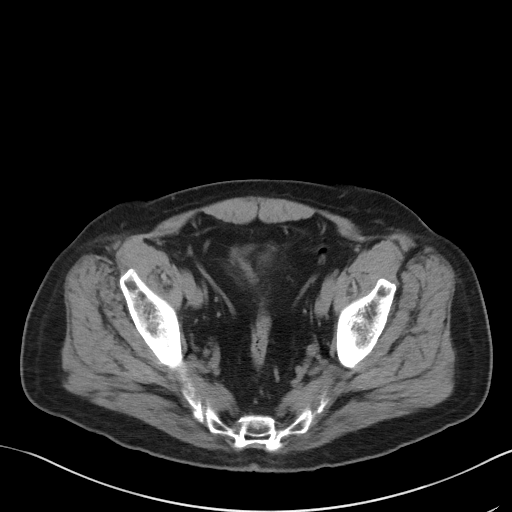
[im 34/90  soft-tissue]
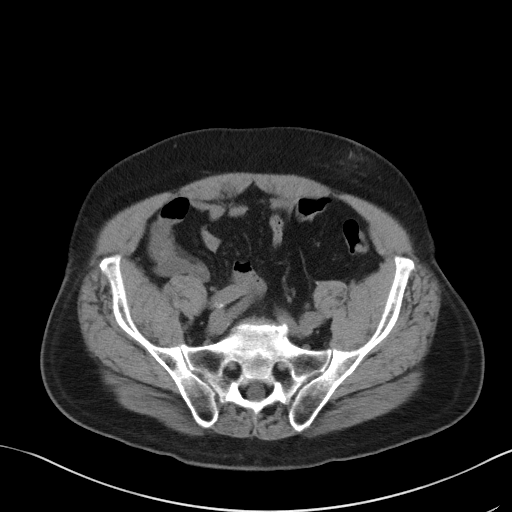
[im 41/90  soft-tissue]
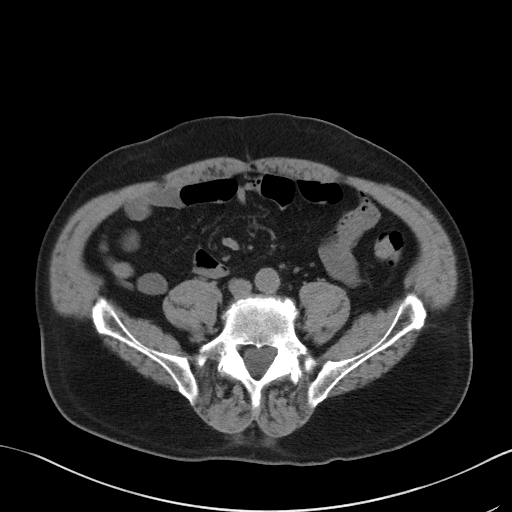
[im 49/90  soft-tissue]
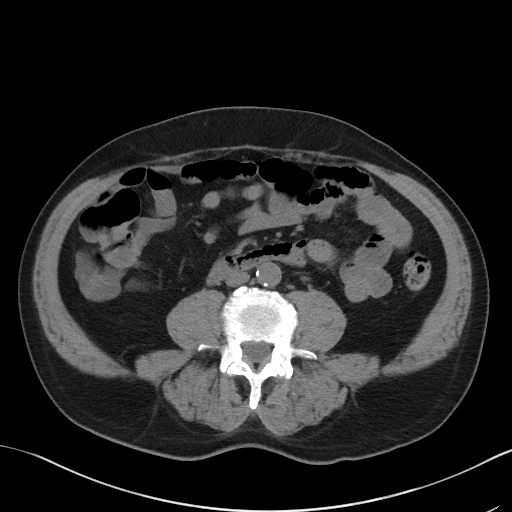
[im 56/90  soft-tissue]
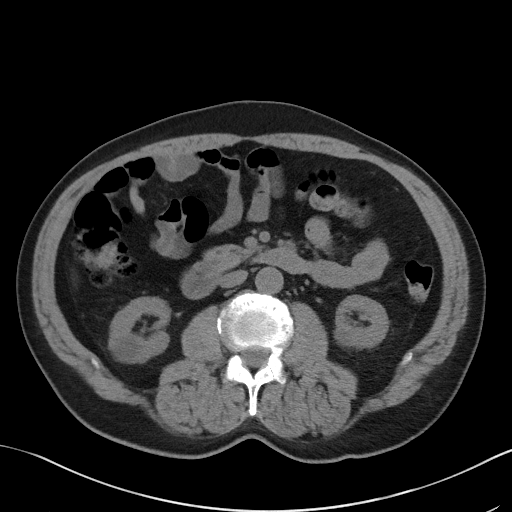
[im 64/90  soft-tissue]
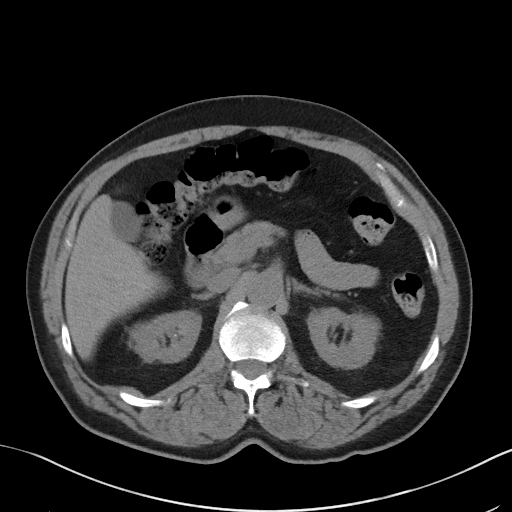
[im 64/90  bone]
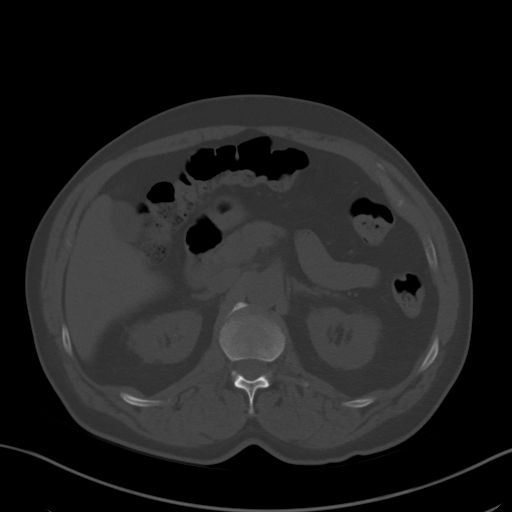
[im 71/90  soft-tissue]
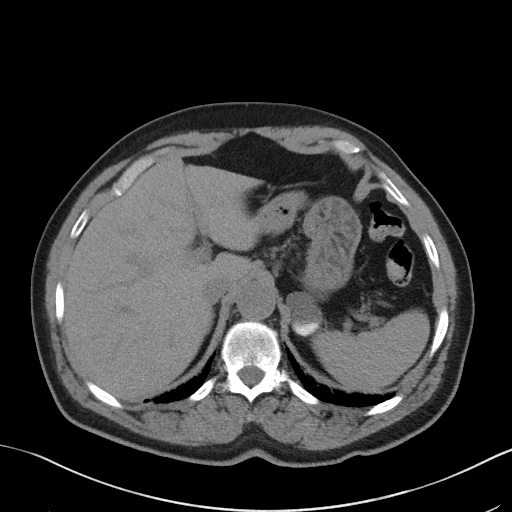
[im 78/90  soft-tissue]
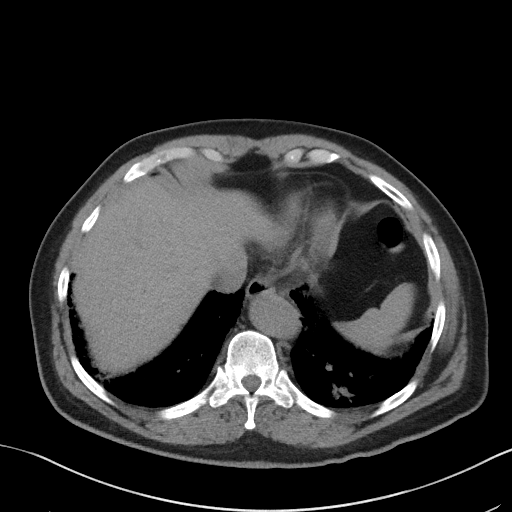
[im 86/90  soft-tissue]
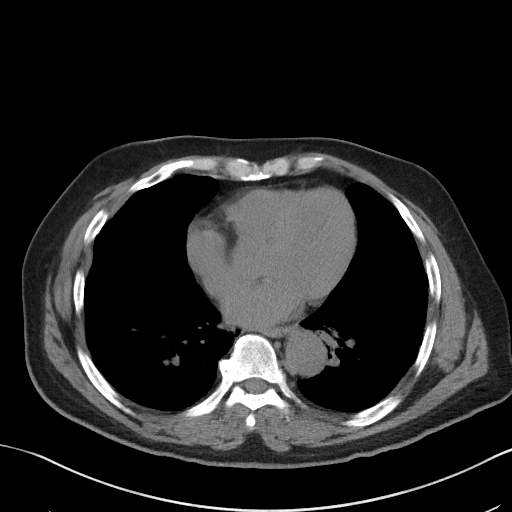

[Series 5: coronal · coronal · 0.69mm/px · 3 of 131 slices shown]
[im 44/131  soft-tissue]
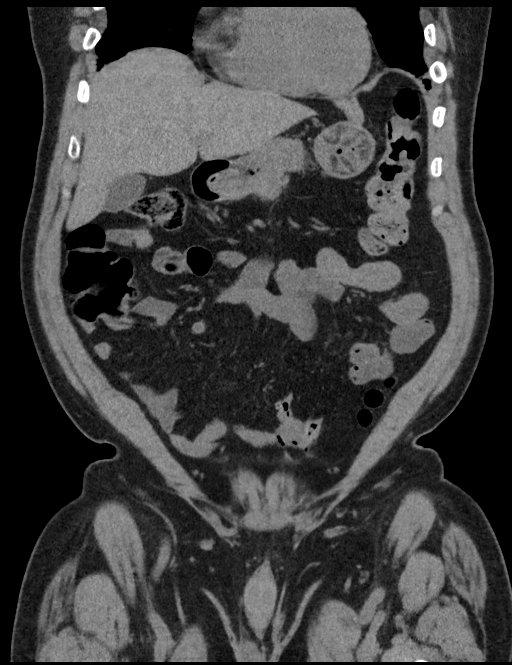
[im 58/131  soft-tissue]
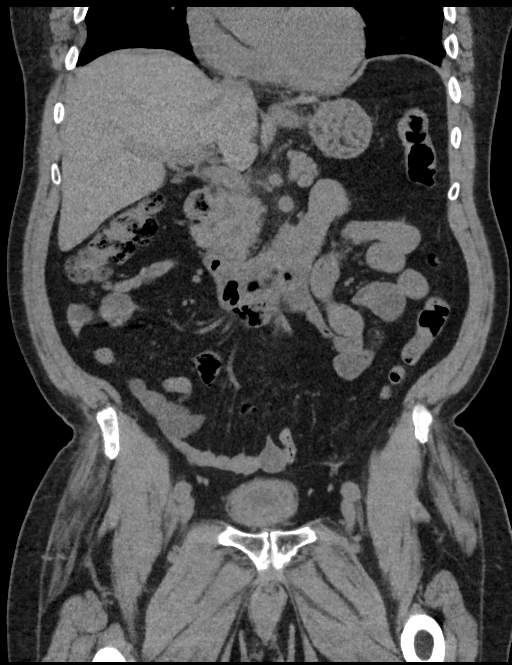
[im 73/131  soft-tissue]
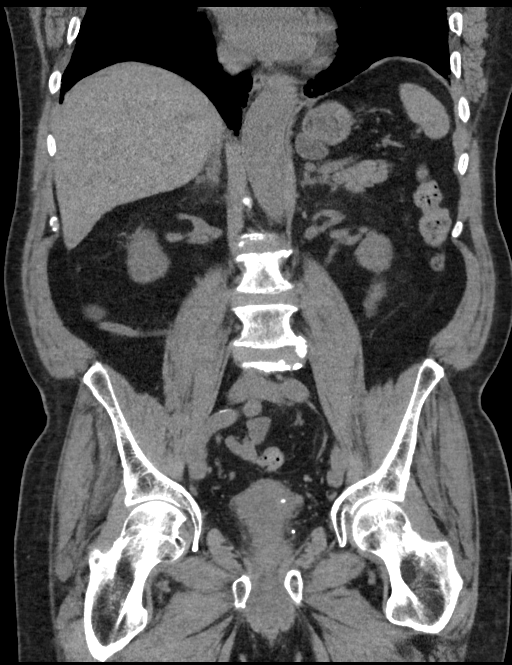

[15 of 46 positions shown; findings below may reference images not displayed]

FINDINGS: Lower chest: Emphysema at the lung bases. Left base opacity is new
since the prior exam, including on image 14/series 4. Mild
cardiomegaly. Mild bilateral gynecomastia.

Hepatobiliary: Normal liver. Normal gallbladder, without biliary
ductal dilatation.

Pancreas: Normal, without mass or ductal dilatation.

Spleen: Normal in size, without focal abnormality.

Adrenals/Urinary Tract: Normal adrenal glands. Mild renal cortical
thinning bilaterally. Lower pole 2.7 cm right renal cyst. A lower
pole 14 mm left renal low-density lesion is likely a cyst.

Posterior interpolar left renal lesion measures 2.8 cm and minimally
greater than fluid density. This is present back to 04/27/13, where
it measured 2.2 cm.

Punctate right renal collecting system calculus, without
hydronephrosis or hydroureter. Moderately thickened bladder wall
with mild pericystic edema. Left-sided bladder base stone measures 5
mm.

Stomach/Bowel: Gastric diverticulum again identified posteriorly.
Otherwise normal stomach. Normal colon, appendix, and terminal
ileum. Normal small bowel.

Vascular/Lymphatic: Aortic and branch vessel atherosclerosis. No
abdominopelvic adenopathy.

Reproductive: Mild prostatomegaly.

Other: No significant free fluid.

Musculoskeletal: No acute osseous abnormality.
IMPRESSION: 1. Right nephrolithiasis, without obstructive uropathy.
2. Left-sided bladder stone.
3. Prostatomegaly. Concurrent bladder wall thickening and mild
pericystic edema. Findings are grossly similar to 02/13/2016. The
bladder findings could be related to bladder outlet obstruction or
cystitis.
4. Bilateral renal low-density lesions are likely cysts. An
interpolar left renal lesion measures slightly greater than fluid
density, and has enlarged minimally since 04/27/2013. Favor a
complex cyst. Technically indeterminate. Consider definitive
characterization with pre and post contrast abdominal MRI. As an
interpolar left renal lesion in this region appeared simple on the
prior ultrasound (favored to represent the same lesion) ultrasound
surveillance at 6-12 months would be a reasonable strategy.
5. New left base opacity since 02/13/2016. Favored to represent an
area of infection or aspiration. Correlate with chest symptoms.
Consider chest CT followup at 3- 6 months to confirm stability or
resolution.

## 2018-01-28 ENCOUNTER — Ambulatory Visit: Payer: Medicare Other | Admitting: Urology

## 2018-02-12 ENCOUNTER — Encounter: Payer: Self-pay | Admitting: Urology

## 2018-02-12 ENCOUNTER — Ambulatory Visit (INDEPENDENT_AMBULATORY_CARE_PROVIDER_SITE_OTHER): Payer: Medicare Other | Admitting: Urology

## 2018-02-12 VITALS — BP 119/55 | HR 71 | Ht 65.0 in | Wt 176.0 lb

## 2018-02-12 DIAGNOSIS — Z87442 Personal history of urinary calculi: Secondary | ICD-10-CM

## 2018-02-12 DIAGNOSIS — N281 Cyst of kidney, acquired: Secondary | ICD-10-CM

## 2018-02-12 DIAGNOSIS — N200 Calculus of kidney: Secondary | ICD-10-CM

## 2018-02-12 DIAGNOSIS — N401 Enlarged prostate with lower urinary tract symptoms: Secondary | ICD-10-CM | POA: Diagnosis not present

## 2018-02-12 DIAGNOSIS — N138 Other obstructive and reflux uropathy: Secondary | ICD-10-CM

## 2018-02-12 LAB — URINALYSIS, COMPLETE
Bilirubin, UA: NEGATIVE
Glucose, UA: NEGATIVE
Ketones, UA: NEGATIVE
LEUKOCYTES UA: NEGATIVE
NITRITE UA: NEGATIVE
PH UA: 7 (ref 5.0–7.5)
Protein, UA: NEGATIVE
RBC, UA: NEGATIVE
SPEC GRAV UA: 1.015 (ref 1.005–1.030)
Urobilinogen, Ur: 0.2 mg/dL (ref 0.2–1.0)

## 2018-02-12 LAB — MICROSCOPIC EXAMINATION: RBC MICROSCOPIC, UA: NONE SEEN /HPF (ref 0–2)

## 2018-02-12 LAB — BLADDER SCAN AMB NON-IMAGING

## 2018-02-12 NOTE — Progress Notes (Signed)
02/12/2018 9:34 AM   [place] 06-29-1932 [id number]  Referring provider: Marisue Ivan, MD [phone] [facility] [address] [facility] [place], [place] [address]  Chief Complaint  Patient presents with  . Follow-up    65month    HPI: 84 year old male who returns today for routine six-month follow-up.  He has a personal history of a large prostatic abscess as well as a distal left ureteral calculus who underwent trans-rectal unroofing of his prostatic abscess as well as left ureteroscopy on 05/2017.  He was treated with a prolonged course of IV ertapenem for his infection, followed by Dr. Sampson Goon.  Follow-up imaging showed resolution of his hydronephrosis bilaterally as well as no residual prostatic abscess in 06/2017.  PVR today 0.  He does have stage IV CKD, creatinine 2.1 as of 01/2018.  Today, he is doing really well.  No further UTI or urinary symptoms.  IPSS as below.  He continues on flomax.    He does have a personal history of nephrolithiasis.  He is passed 2 stones spontaneously and underwent ureteroscopy in 2018.  He denies any flank pain or gross hematuria.  He does have some positional pain in his left lower back with shaving but resolves with cessation of this activity.    IPSS    Row Name 02/12/18 0900         International Prostate Symptom Score   How often have you had the sensation of not emptying your bladder?  About half the time     How often have you had to urinate less than every two hours?  Less than 1 in 5 times     How often have you found you stopped and started again several times when you urinated?  Not at All     How often have you found it difficult to postpone urination?  Not at All     How often have you had a weak urinary stream?  Less than 1 in 5 times     How often have you had to strain to start urination?  Not at All     How many times did you typically get up at night to urinate?  1 Time     Total IPSS Score  6       Quality of Life due to urinary symptoms   If you were to spend the rest of your life with your urinary condition just the way it is now how would you feel about that?  Pleased        Score:  1-7 Mild 8-19 Moderate 20-35 Severe    PMH: Past Medical History:  Diagnosis Date  . Diabetes mellitus without complication (HCC)   . Gout   . Hypertension   . Kidney stone   . Seizures (HCC)    last seizure about 1 year ago(2017)  . Thyroid disease     Surgical History: Past Surgical History:  Procedure Laterality Date  . CYSTOSCOPY/URETEROSCOPY/HOLMIUM LASER/STENT PLACEMENT Left 05/07/2017   Procedure: CYSTOSCOPY/URETEROSCOPY/HOLMIUM LASER/STENT PLACEMENT;  Surgeon: Vanna Scotland, MD;  Location: [facility];  Service: Urology;  Laterality: Left;  . EXTRACORPOREAL SHOCK WAVE LITHOTRIPSY Left 02/22/2016   Procedure: EXTRACORPOREAL SHOCK WAVE LITHOTRIPSY (ESWL);  Surgeon: Hildred Laser, MD;  Location: [facility];  Service: Urology;  Laterality: Left;  . EYE SURGERY Left    cataract surgery  . HERNIA REPAIR  70's  . TRANSURETHRAL RESECTION OF PROSTATE N/A 05/07/2017   Procedure: TRANSURETHRAL RESECTION OF THE PROSTATE (TURP);  Surgeon: Vanna Scotland, MD;  Location: [facility];  Service: Urology;  Laterality: N/A;    Home Medications:  Allergies as of 02/12/2018      Reactions   Enalapril Swelling   Face swelling.   Lisinopril Swelling   mouth      Medication List        Accurate as of 02/12/18  9:34 AM. Always use your most recent med list.          ACCU-CHEK AVIVA test strip Generic drug:  glucose blood 1 each by Other route 3 (three) times daily. (Fasting and 2 hours after largest meal)   acetaminophen 325 MG tablet Commonly known as:  TYLENOL Take 1 tablet (325 mg total) by mouth every 6 (six) hours as needed for mild pain (or Fever >/= 101).   atorvastatin 20 MG tablet Commonly known as:  LIPITOR Take 20 mg by mouth at bedtime.   donepezil 5 MG tablet Commonly known  as:  ARICEPT Take 5 mg by mouth at bedtime.   ertapenem 0.5 g in sodium chloride 0.9 % 50 mL Inject 0.5 g into the vein daily.   ertapenem 1 g injection Commonly known as:  INVANZ Inject into the vein.   febuxostat 40 MG tablet Commonly known as:  ULORIC Take 40 mg by mouth daily.   ferrous sulfate 325 (65 FE) MG tablet Take 325 mg by mouth daily.   folic acid 1 MG tablet Commonly known as:  FOLVITE Take 1 mg by mouth daily.   gabapentin 300 MG capsule Commonly known as:  NEURONTIN Take 300 mg by mouth 3 (three) times daily.   insulin glargine 100 UNIT/ML injection Commonly known as:  LANTUS Inject 0.3 mLs (30 Units total) into the skin daily.   insulin lispro 100 UNIT/ML injection Commonly known as:  HUMALOG Inject 5 Units into the skin 3 (three) times daily before meals. (Hold if CBG is less than 100)   lamoTRIgine 25 MG tablet Commonly known as:  LAMICTAL Take 1 tablet (25 mg total) by mouth daily.   levothyroxine 75 MCG tablet Commonly known as:  SYNTHROID, LEVOTHROID Take 75 mcg by mouth daily.   patiromer 8.4 g packet Commonly known as:  VELTASSA Take 1 packet (8.4 g total) by mouth daily.   sodium bicarbonate 650 MG tablet Take 1,300 mg by mouth 3 (three) times daily.   tamsulosin 0.4 MG Caps capsule Commonly known as:  FLOMAX Take 0.4 mg by mouth.       Allergies:  Allergies  Allergen Reactions  . Enalapril Swelling    Face swelling.  . Lisinopril Swelling    mouth    Family History: Family History  Problem Relation Age of Onset  . Cancer Mother   . Diabetes Sister   . Kidney disease Neg Hx   . Prostate cancer Neg Hx   . Kidney cancer Neg Hx   . Bladder Cancer Neg Hx     Social History:  reports that he has never smoked. He has never used smokeless tobacco. He reports that he does not drink alcohol or use drugs.  ROS: UROLOGY Frequent Urination?: No Hard to postpone urination?: No Burning/pain with urination?: No Get up at  night to urinate?: No Leakage of urine?: Yes Urine stream starts and stops?: No Trouble starting stream?: No Do you have to strain to urinate?: No Blood in urine?: No Urinary tract infection?: No Sexually transmitted disease?: No Injury to kidneys or bladder?: No Painful intercourse?: No Weak  stream?: No Erection problems?: No Penile pain?: No  Gastrointestinal Nausea?: No Vomiting?: No Indigestion/heartburn?: No Diarrhea?: No Constipation?: No  Constitutional Fever: No Night sweats?: No Weight loss?: No Fatigue?: No  Skin Skin rash/lesions?: No Itching?: No  Eyes Blurred vision?: No Double vision?: Yes  Ears/Nose/Throat Sore throat?: No Sinus problems?: No  Hematologic/Lymphatic Swollen glands?: No Easy bruising?: No  Cardiovascular Leg swelling?: No Chest pain?: No  Respiratory Cough?: No Shortness of breath?: Yes  Endocrine Excessive thirst?: No  Musculoskeletal Back pain?: Yes Joint pain?: No  Neurological Headaches?: No Dizziness?: No  Psychologic Depression?: No Anxiety?: No  Physical Exam: BP (!) 119/55   Pulse 71   Ht  (1.651 m)   Wt 176 lb (79.8 kg)   BMI 29.29 kg/m   Constitutional:  Alert and oriented, No acute distress. HEENT: [place] AT, moist mucus membranes.  Trachea midline, no masses. Cardiovascular: No clubbing, cyanosis, or edema. Respiratory: Normal respiratory effort, no increased work of breathing. GI: Abdomen is soft, nontender, nondistended, no abdominal masses GU: No CVA tenderness Skin: No rashes, bruises or suspicious lesions. Neurologic: Grossly intact, no focal deficits, moving all 4 extremities. Psychiatric: Normal mood and affect.  Laboratory Data: Lab Results  Component Value Date   WBC 12.1 (H) 05/09/2017   HGB 9.2 (L) 05/09/2017   HCT 28.2 (L) 05/09/2017   MCV 86.1 05/09/2017   PLT 253 05/09/2017    Lab Results  Component Value Date   CREATININE 2.01 (H) 05/09/2017     Lab Results    Component Value Date   HGBA1C 7.6 (H) 05/05/2017    Urinalysis UA today personally reviewed, negative on dip as well as microscopy.  Pertinent Imaging: Results for orders placed or performed in visit on 02/12/18  BLADDER SCAN AMB NON-IMAGING  Result Value Ref Range   Scan Result 0ml     Assessment & Plan:    1. History of kidney stones Currently asymptomatic No stones on most recent renal ultrasound 6 months ago Follow-up in 1 year with KUB or sooner as needed - Urinalysis, Complete - BLADDER SCAN AMB NON-IMAGING - DG Abd 1 View; Future  2. BPH with obstruction/lower urinary tract symptoms Doing well, relatively asymptomatic Adequate bladder emptying today Continue Flomax  3. Renal cyst Bilateral simple renal cyst, no further evaluation needed   Return in about 1 year (around 02/13/2019) for KUB, IPSS/ PVR/ UA.  Vanna Scotland, MD  [facility] [address] [place], [place] [address] [phone]

## 2018-05-14 ENCOUNTER — Other Ambulatory Visit: Payer: Self-pay | Admitting: Family Medicine

## 2018-05-14 DIAGNOSIS — M545 Low back pain: Secondary | ICD-10-CM

## 2018-05-28 ENCOUNTER — Ambulatory Visit
Admission: RE | Admit: 2018-05-28 | Discharge: 2018-05-28 | Disposition: A | Discharge status: Home / Self Care | Payer: Medicare Other | Source: Ambulatory Visit | Attending: Family Medicine | Admitting: Family Medicine

## 2018-05-28 DIAGNOSIS — I700 Atherosclerosis of aorta: Secondary | ICD-10-CM | POA: Diagnosis not present

## 2018-05-28 DIAGNOSIS — R918 Other nonspecific abnormal finding of lung field: Secondary | ICD-10-CM | POA: Insufficient documentation

## 2018-05-28 DIAGNOSIS — M545 Low back pain: Secondary | ICD-10-CM | POA: Insufficient documentation

## 2018-06-09 ENCOUNTER — Other Ambulatory Visit: Payer: Self-pay | Admitting: Specialist

## 2018-06-09 DIAGNOSIS — J849 Interstitial pulmonary disease, unspecified: Secondary | ICD-10-CM

## 2018-08-11 ENCOUNTER — Other Ambulatory Visit: Payer: Self-pay | Admitting: Specialist

## 2018-08-11 DIAGNOSIS — J849 Interstitial pulmonary disease, unspecified: Secondary | ICD-10-CM

## 2018-09-08 ENCOUNTER — Ambulatory Visit
Admission: RE | Admit: 2018-09-08 | Discharge: 2018-09-08 | Disposition: A | Discharge status: Home / Self Care | Payer: Medicare Other | Source: Ambulatory Visit | Attending: Specialist | Admitting: Specialist

## 2018-09-08 DIAGNOSIS — J849 Interstitial pulmonary disease, unspecified: Secondary | ICD-10-CM | POA: Diagnosis not present

## 2019-02-16 ENCOUNTER — Telehealth (INDEPENDENT_AMBULATORY_CARE_PROVIDER_SITE_OTHER): Payer: Medicare Other | Admitting: Urology

## 2019-02-16 ENCOUNTER — Other Ambulatory Visit: Payer: Self-pay

## 2019-02-16 DIAGNOSIS — Z87442 Personal history of urinary calculi: Secondary | ICD-10-CM

## 2019-02-16 DIAGNOSIS — N138 Other obstructive and reflux uropathy: Secondary | ICD-10-CM

## 2019-02-16 NOTE — Progress Notes (Signed)
Virtual Visit via Telephone Note  I connected with Phillip [place] on 02/16/19 at  9:00 AM EDT by telephone and verified that I am speaking with the correct person using two identifiers.  Location: Patient: home Provider: home   I discussed the limitations, risks, security and privacy concerns of performing an evaluation and management service by telephone and the availability of in person appointments. I also discussed with the patient that there may be a patient responsible charge related to this service. The patient expressed understanding and agreed to proceed.   History of Present Illness: 85 yo M with personal history of kidney stones, prostatic abscess, BPH who presents today via phone call for annual follow up.    No voiding issues this year.  Good urinary stream, no hesitancy, urgency or frequency.  Feels like he is able to empty well.  Previous PVR minimal.    No UTIs, dysuria, gross hematuria.  No admissions this year.    Continues to take flomax chronically.  No dizziness.  No flank pain, kidney stones this year.    Cr stable 2.2  12/19  No recent UA.     Observations/Objective: No acute distress.  Accompanied on speaker phone by his "soul" mate [place].    Assessment and Plan:  1. BPH with obstruction/lower urinary tract symptoms No further infections s/p TUR prostatic abscess in 2018 Minimal voiding symptoms Discussed trial of holding flomax for 1-2 weeks, reassess symptoms; consider stopping this medication if symptoms unchanged   2. History of kidney stones Clinically asymptomatic Most recent upper tract imaging without stones or hydronephrosis Encouraged hydration  Follow Up Instructions: F/u prpn   I discussed the assessment and treatment plan with the patient. The patient was provided an opportunity to ask questions and all were answered. The patient agreed with the plan and demonstrated an understanding of the instructions.   The patient was advised  to call back or seek an in-person evaluation if the symptoms worsen or if the condition fails to improve as anticipated.  I provided 7 minutes of non-face-to-face time during this encounter.   Vanna Scotland, MD

## 2019-03-25 ENCOUNTER — Other Ambulatory Visit: Payer: Self-pay | Admitting: Specialist

## 2019-03-25 DIAGNOSIS — J849 Interstitial pulmonary disease, unspecified: Secondary | ICD-10-CM

## 2019-04-06 ENCOUNTER — Ambulatory Visit
Admission: RE | Admit: 2019-04-06 | Discharge: 2019-04-06 | Disposition: A | Discharge status: Home / Self Care | Payer: Medicare Other | Source: Ambulatory Visit | Attending: Specialist | Admitting: Specialist

## 2019-04-06 ENCOUNTER — Other Ambulatory Visit: Payer: Self-pay

## 2019-04-06 DIAGNOSIS — J849 Interstitial pulmonary disease, unspecified: Secondary | ICD-10-CM | POA: Insufficient documentation

## 2019-08-17 ENCOUNTER — Telehealth: Payer: Self-pay | Admitting: Urology

## 2019-08-17 MED ORDER — TAMSULOSIN HCL 0.4 MG PO CAPS: 0.4000 mg | ORAL_CAPSULE | Freq: Every day | ORAL | 11 refills | Status: AC

## 2019-08-17 NOTE — Telephone Encounter (Signed)
Pt states that he has a slow urine stream and would like Flomax to be called in.

## 2019-08-18 NOTE — Telephone Encounter (Signed)
RX sent in, patient notified 

## 2019-09-24 ENCOUNTER — Other Ambulatory Visit: Payer: Self-pay | Admitting: Specialist

## 2019-09-24 DIAGNOSIS — J849 Interstitial pulmonary disease, unspecified: Secondary | ICD-10-CM

## 2019-10-06 ENCOUNTER — Other Ambulatory Visit: Payer: Self-pay

## 2019-10-06 ENCOUNTER — Ambulatory Visit
Admission: RE | Admit: 2019-10-06 | Discharge: 2019-10-06 | Disposition: A | Discharge status: Home / Self Care | Payer: Medicare Other | Source: Ambulatory Visit | Attending: Specialist | Admitting: Specialist

## 2019-10-06 DIAGNOSIS — J849 Interstitial pulmonary disease, unspecified: Secondary | ICD-10-CM | POA: Insufficient documentation

## 2019-10-11 ENCOUNTER — Other Ambulatory Visit: Payer: Self-pay | Admitting: Specialist

## 2019-10-11 DIAGNOSIS — R911 Solitary pulmonary nodule: Secondary | ICD-10-CM

## 2019-10-19 ENCOUNTER — Encounter
Admission: RE | Admit: 2019-10-19 | Discharge: 2019-10-19 | Disposition: A | Discharge status: Home / Self Care | Payer: Medicare Other | Source: Ambulatory Visit | Attending: Specialist | Admitting: Specialist

## 2019-10-19 ENCOUNTER — Other Ambulatory Visit: Payer: Self-pay

## 2019-10-19 DIAGNOSIS — R911 Solitary pulmonary nodule: Secondary | ICD-10-CM | POA: Insufficient documentation

## 2019-10-19 DIAGNOSIS — Z79899 Other long term (current) drug therapy: Secondary | ICD-10-CM | POA: Diagnosis not present

## 2019-10-19 DIAGNOSIS — I10 Essential (primary) hypertension: Secondary | ICD-10-CM | POA: Diagnosis not present

## 2019-10-19 DIAGNOSIS — Z794 Long term (current) use of insulin: Secondary | ICD-10-CM | POA: Diagnosis not present

## 2019-10-19 LAB — GLUCOSE, CAPILLARY: Glucose-Capillary: 98 mg/dL (ref 70–99)

## 2019-10-19 MED ORDER — FLUDEOXYGLUCOSE F - 18 (FDG) INJECTION
9.8000 | Freq: Once | INTRAVENOUS | Status: AC | PRN
  Administered 2019-10-19: 9.71 via INTRAVENOUS

## 2019-11-10 ENCOUNTER — Ambulatory Visit: Payer: Medicare Other | Admitting: Urology

## 2019-11-10 ENCOUNTER — Encounter: Payer: Self-pay | Admitting: Urology

## 2019-11-10 ENCOUNTER — Ambulatory Visit (INDEPENDENT_AMBULATORY_CARE_PROVIDER_SITE_OTHER): Payer: Medicare Other | Admitting: Urology

## 2019-11-10 ENCOUNTER — Other Ambulatory Visit: Payer: Self-pay

## 2019-11-10 VITALS — BP 138/70 | HR 71 | Ht 65.0 in | Wt 175.0 lb

## 2019-11-10 DIAGNOSIS — N401 Enlarged prostate with lower urinary tract symptoms: Secondary | ICD-10-CM

## 2019-11-10 DIAGNOSIS — R972 Elevated prostate specific antigen [PSA]: Secondary | ICD-10-CM

## 2019-11-10 DIAGNOSIS — N138 Other obstructive and reflux uropathy: Secondary | ICD-10-CM | POA: Diagnosis not present

## 2019-11-10 NOTE — Progress Notes (Signed)
11/10/2019 9:56 AM   Phillip [place] September 01, 1932 [id number]  Referring provider: Marisue Ivan, MD [phone] [facility] [address] [facility] [place],  [place] [address]  Chief Complaint  Patient presents with  . Benign Prostatic Hypertrophy    HPI: 86 year old patient with a personal history of prostatitis/prostamegaly prostatic abscess he returns today for reevaluation referred back by Dr. Meredeth Ide his pulmonologist.  Patient is being worked up for chronic lung disease versus lung cancer.  Incidentally on PET scan performed on 10/19/2019, heterogeneity of the prostate was appreciated which was read as a "changes of prostatitis nonspecific and recommended PSA correlation".  No evidence of retroperitoneal adenopathy or other metastatic foci.  This was done on 10/20/2018 which was found to be 31.5.   He does have baseline CKD followed by nephrology.  He is chronically on Flomax for BPH.  Here for 72nd Flomax, is a good urinary stream.  He denies any dysuria or gross hematuria today.  He is doing well from a urinary standpoint.  No weight loss or bone pain.   PMH: Past Medical History:  Diagnosis Date  . Diabetes mellitus without complication (HCC)   . Gout   . Hypertension   . Kidney stone   . Seizures (HCC)    last seizure about 1 year ago(2017)  . Thyroid disease     Surgical History: Past Surgical History:  Procedure Laterality Date  . CYSTOSCOPY/URETEROSCOPY/HOLMIUM LASER/STENT PLACEMENT Left 05/07/2017   Procedure: CYSTOSCOPY/URETEROSCOPY/HOLMIUM LASER/STENT PLACEMENT;  Surgeon: Vanna Scotland, MD;  Location: [facility];  Service: Urology;  Laterality: Left;  . EXTRACORPOREAL SHOCK WAVE LITHOTRIPSY Left 02/22/2016   Procedure: EXTRACORPOREAL SHOCK WAVE LITHOTRIPSY (ESWL);  Surgeon: Hildred Laser, MD;  Location: [facility];  Service: Urology;  Laterality: Left;  . EYE SURGERY Left    cataract surgery  . HERNIA REPAIR  70's  . TRANSURETHRAL RESECTION OF  PROSTATE N/A 05/07/2017   Procedure: TRANSURETHRAL RESECTION OF THE PROSTATE (TURP);  Surgeon: Vanna Scotland, MD;  Location: [facility] OR[facility];  Service: Urology;  Laterality: N/A;    Home Medications:  Allergies as of 11/10/2019      Reactions   Enalapril Swelling   Face swelling.   Lisinopril Swelling   mouth   Amlodipine Rash, Other (See Comments)      Medication List       Accurate as of November 10, 2019  9:56 AM. If you have any questions, ask your nurse or doctor.        Accu-Chek Aviva test strip Generic drug: glucose blood 1 each by Other route 3 (three) times daily. (Fasting and 2 hours after largest meal)   acetaminophen 325 MG tablet Commonly known as: TYLENOL Take 1 tablet (325 mg total) by mouth every 6 (six) hours as needed for mild pain (or Fever >/= 101).   atorvastatin 20 MG tablet Commonly known as: LIPITOR Take 20 mg by mouth at bedtime.   calcitRIOL 0.25 MCG capsule Commonly known as: ROCALTROL Take by mouth.   donepezil 5 MG tablet Commonly known as: ARICEPT Take 5 mg by mouth at bedtime.   ertapenem 0.5 g in sodium chloride 0.9 % 50 mL Inject 0.5 g into the vein daily.   ertapenem 1 g injection Commonly known as: INVANZ Inject into the vein.   febuxostat 40 MG tablet Commonly known as: ULORIC Take 40 mg by mouth daily.   ferrous sulfate 325 (65 FE) MG tablet Take 325 mg by mouth daily.   folic acid 1 MG tablet  Commonly known as: FOLVITE Take 1 mg by mouth daily.   gabapentin 300 MG capsule Commonly known as: NEURONTIN Take 300 mg by mouth 3 (three) times daily.   insulin glargine 100 UNIT/ML injection Commonly known as: LANTUS Inject 0.3 mLs (30 Units total) into the skin daily.   insulin lispro 100 UNIT/ML injection Commonly known as: HUMALOG Inject 5 Units into the skin 3 (three) times daily before meals. (Hold if CBG is less than 100)   lamoTRIgine 25 MG tablet Commonly known as: LAMICTAL Take 1 tablet (25 mg total) by mouth  daily. What changed:   how much to take  when to take this   levothyroxine 75 MCG tablet Commonly known as: SYNTHROID Take 75 mcg by mouth daily.   patiromer 8.4 g packet Commonly known as: VELTASSA Take 1 packet (8.4 g total) by mouth daily.   sodium bicarbonate 650 MG tablet Take 1,300 mg by mouth 3 (three) times daily.   tamsulosin 0.4 MG Caps capsule Commonly known as: FLOMAX Take 1 capsule (0.4 mg total) by mouth daily.       Allergies:  Allergies  Allergen Reactions  . Enalapril Swelling    Face swelling.  . Lisinopril Swelling    mouth  . Amlodipine Rash and Other (See Comments)    Family History: Family History  Problem Relation Age of Onset  . Cancer Mother   . Diabetes Sister   . Kidney disease Neg Hx   . Prostate cancer Neg Hx   . Kidney cancer Neg Hx   . Bladder Cancer Neg Hx     Social History:  reports that he has never smoked. He has never used smokeless tobacco. He reports that he does not drink alcohol or use drugs.  ROS: UROLOGY Frequent Urination?: No Hard to postpone urination?: No Burning/pain with urination?: No Get up at night to urinate?: Yes Leakage of urine?: No Urine stream starts and stops?: No Trouble starting stream?: No Do you have to strain to urinate?: No Blood in urine?: No Urinary tract infection?: No Sexually transmitted disease?: No Injury to kidneys or bladder?: No Painful intercourse?: No Weak stream?: No Erection problems?: No Penile pain?: No  Gastrointestinal Nausea?: No Vomiting?: No Indigestion/heartburn?: No Diarrhea?: No Constipation?: No  Constitutional Fever: No Night sweats?: No Weight loss?: No Fatigue?: No  Skin Skin rash/lesions?: No Itching?: No  Eyes Blurred vision?: No Double vision?: No  Ears/Nose/Throat Sore throat?: No Sinus problems?: No  Hematologic/Lymphatic Swollen glands?: No Easy bruising?: No  Cardiovascular Leg swelling?: No Chest pain?: No  Respiratory  Cough?: No Shortness of breath?: No  Endocrine Excessive thirst?: No  Musculoskeletal Back pain?: No Joint pain?: No  Neurological Headaches?: No Dizziness?: No  Psychologic Depression?: No Anxiety?: No  Physical Exam: BP 138/70   Pulse 71   Ht 5\' 5"  (1.651 m)   Wt 175 lb (79.4 kg)   BMI 29.12 kg/m   Constitutional:  Alert and oriented, No acute distress.  Accompanied by daughter today.  In wheelchair.  Pleasant, conversive.  Interactive. HEENT: [place] AT, moist mucus membranes.  Trachea midline, no masses. Cardiovascular: No clubbing, cyanosis, or edema. Respiratory: Normal respiratory effort, no increased work of breathing. Rectal exam deferred, would not change management as below Skin: No rashes, bruises or suspicious lesions. Neurologic: Grossly intact, no focal deficits, moving all 4 extremities. Psychiatric: Normal mood and affect.  Laboratory Data: Lab Results  Component Value Date   WBC 12.1 (H) 05/09/2017   HGB 9.2 (L)  05/09/2017   HCT 28.2 (L) 05/09/2017   MCV 86.1 05/09/2017   PLT 253 05/09/2017    Lab Results  Component Value Date   CREATININE 2.01 (H) 05/09/2017    Lab Results  Component Value Date   HGBA1C 7.6 (H) 05/05/2017   PSA as above, none for comparison  Urinalysis UA today with 11-20 white blood cells, otherwise unremarkable.  No bacteria.  Pertinent Imaging: CLINICAL DATA:  Initial treatment strategy for pulmonary nodule.  EXAM: NUCLEAR MEDICINE PET SKULL BASE TO THIGH  TECHNIQUE: 9.71 mCi F-18 FDG was injected intravenously. Full-ring PET imaging was performed from the skull base to thigh after the radiotracer. CT data was obtained and used for attenuation correction and anatomic localization.  Fasting blood glucose: 98 mg/dl  COMPARISON:  CT chest of October 06, 2019 and multiple prior CT chest evaluations.  FINDINGS: Mediastinal blood pool activity: SUV max 2.59  Liver activity: SUV max 3.63  NECK: No  hypermetabolic lymph nodes in the neck.  Incidental CT findings: none  CHEST: Intensely hypermetabolic right paratracheal lymph node measures 10 mm (image 82, series 3) (SUVmax = 11.2) subtle added density at baseline within this lymph node and no change in its appearance based on comparison with May 28, 2018. Calcified hilar lymph nodes on the right and in the subcarinal region.  Small lymph node in the left hilum also with subtle increased density at 5 mm short axis along left infrahilar bronchovascular bundle, showing no change in size compared to previous imaging (SUVmax = 5.39) small AP window lymph node with mildly increased FDG uptake as well measuring 7 mm. Unchanged since 2019.  Area of concern in the right upper lobe is not as well delineated given respiratory motion on the current exam, grossly unchanged compared to most recent prior. (SUVmax = 1.79  Signs of chronic interstitial lung disease are better displayed on recent chest CT.)  Incidental CT findings: Calcified atherosclerotic changes in the thoracic aorta. Heart size is mildly enlarged without pericardial effusion, unchanged from prior exams. Scattered mediastinal lymph nodes described above. No signs of thoracic inlet adenopathy or axillary lymphadenopathy.  ABDOMEN/PELVIS: No signs of abnormal uptake in the abdomen. No adenopathy with increased metabolic activity.  Mildly increased uptake in the prostate gland diffusely. (SUVmax = 5.69 a) is is  Incidental CT findings: Renal cysts and mild renal cortical scarring. Gastric diverticulum, moderate-sized. Scattered atherosclerosis.  SKELETON: No focal hypermetabolic activity to suggest skeletal metastasis.  Incidental CT findings: Signs of chronic sinus disease in the left sphenoid sinus. Less opacified than on the previous head CT of 09/06/2013. Is signs of spinal degenerative change. No acute bone finding or destructive bone process.   IMPRESSION: 1. Intensely FDG avid lymph node in the right paratracheal chain with other less avid lymph nodes throughout the chest. Findings are suspicious for malignancy though could potentially be seen in a active granulomatous process. SUV values are at the upper range, beyond what is normally expected for this possibility but the lymph node is not changed since August of 2019. This is also unusual for "metastatic" process from the right upper lobe which has findings more compatible with an indolent neoplasm or chronic low level inflammation. 2. Area of concern in the right upper lobe may represent sequela of scarring or related to interstitial lung disease. Indolent neoplasm not excluded on the basis of this FDG PET evaluation. 3. Heterogeneous prostate uptake could reflect changes of prostatitis and are nonspecific. PSA correlation may also be helpful.  Electronically Signed   By: Donzetta Kohut M.D.   On: 10/19/2019 11:38  Assessment & Plan:    1. BPH with obstruction/lower urinary tract symptoms Symptoms well controlled on Flomax - Urinalysis, Complete  2. Elevated PSA PET scan was personally reviewed as well as compared to previous cross-sectional imaging from 2018.  Patient has known prostatic heterogeneous likely related to underlying chronic prostatitis and history of prostatic abscess.  This appears unchanged on this most recent PET scan without evidence of metastatic disease.  PSA incidentally 35 which may be related to chronic prostatitis and/or prostate cancer.  Explained to the patient and his daughter that PSA screening is not indicated for asymptomatic patients life expectancy less than 10 years as treatment would only be reserved for palliation of symptoms rather than definitive therapy for localized prostate cancer.  I recommend a follow-up in 6 months with repeat PSA.  If his PSA remains stable, would likely not check further.  If it is a rapid rise, may  consider systemic therapy with hormones if clinically indicated at the time.  We reviewed symptoms and warning signs, indications for more urgent evaluation.  Both he and his daughter understand the situation completely and all questions were answered.  - PSA; Future    Return in about 6 months (around 05/09/2020) for PSA prior.  Vanna Scotland, MD  [facility] [address] [place], [place] [address] [phone]

## 2019-11-11 LAB — URINALYSIS, COMPLETE
Bilirubin, UA: NEGATIVE
Glucose, UA: NEGATIVE
Ketones, UA: NEGATIVE
Nitrite, UA: NEGATIVE
Protein,UA: NEGATIVE
Specific Gravity, UA: 1.015 (ref 1.005–1.030)
Urobilinogen, Ur: 0.2 mg/dL (ref 0.2–1.0)
pH, UA: 7 (ref 5.0–7.5)

## 2019-11-11 LAB — MICROSCOPIC EXAMINATION: Bacteria, UA: NONE SEEN

## 2019-11-16 ENCOUNTER — Telehealth: Payer: Self-pay

## 2019-11-16 NOTE — Telephone Encounter (Signed)
Marchelle Folks patient's home health nurse [phone]) called to have some changed made to patient's medication list. Med list was updated

## 2020-02-23 ENCOUNTER — Other Ambulatory Visit: Payer: Self-pay | Admitting: Specialist

## 2020-02-23 DIAGNOSIS — R0609 Other forms of dyspnea: Secondary | ICD-10-CM

## 2020-02-23 DIAGNOSIS — J849 Interstitial pulmonary disease, unspecified: Secondary | ICD-10-CM

## 2020-03-08 ENCOUNTER — Other Ambulatory Visit: Payer: Self-pay

## 2020-03-08 ENCOUNTER — Ambulatory Visit
Admission: RE | Admit: 2020-03-08 | Discharge: 2020-03-08 | Disposition: A | Discharge status: Home / Self Care | Payer: Medicare Other | Source: Ambulatory Visit | Attending: Specialist | Admitting: Specialist

## 2020-03-08 DIAGNOSIS — J849 Interstitial pulmonary disease, unspecified: Secondary | ICD-10-CM | POA: Diagnosis present

## 2020-03-08 DIAGNOSIS — R0609 Other forms of dyspnea: Secondary | ICD-10-CM

## 2020-03-08 DIAGNOSIS — R0600 Dyspnea, unspecified: Secondary | ICD-10-CM | POA: Insufficient documentation

## 2020-05-09 ENCOUNTER — Ambulatory Visit: Payer: Self-pay | Admitting: Urology

## 2020-05-09 ENCOUNTER — Other Ambulatory Visit: Payer: Self-pay

## 2020-05-09 ENCOUNTER — Other Ambulatory Visit: Payer: Medicare Other

## 2020-05-09 DIAGNOSIS — R972 Elevated prostate specific antigen [PSA]: Secondary | ICD-10-CM

## 2020-05-10 ENCOUNTER — Ambulatory Visit: Payer: Self-pay | Admitting: Urology

## 2020-05-10 LAB — PSA: Prostate Specific Ag, Serum: 47.6 ng/mL — ABNORMAL HIGH (ref 0.0–4.0)

## 2020-05-17 ENCOUNTER — Ambulatory Visit: Payer: Self-pay | Admitting: Urology

## 2020-05-29 NOTE — Progress Notes (Signed)
05/30/2020 5:30 PM   Phillip Osborne [birth year] [id number]  Referring provider: Marisue Ivan, MD [phone] [facility] [address] [facility] [place],  [place] [address] Chief Complaint  Patient presents with  . Benign Prostatic Hypertrophy    53mo follow up  . Elevated PSA    HPI: Phillip Osborne is a 86 y.o. male who presents today for a 6 month follow up of BPH with obstruction/LUTS and elevated PSA.   The patient has a personal history of prostatitis/prostamegaly prostatic abscess.   Patient was being worked up for chronic lung disease versus lung cancer.  Incidentally on PET scan performed on 10/19/2019, heterogeneity of the prostate was appreciated which was read as a "changes of prostatitis nonspecific and recommended PSA correlation".  No evidence of retroperitoneal adenopathy or other metastatic foci.  This was done on 10/20/2018 which was found to be 31.5.   Chest CT w/ contrast high resolution on 03/08/2020 revealed right upper lobe ground-glass mass is stable from 10/06/2019 but has increased in size from 05/28/2018 and remains worrisome for adenocarcinoma. Mild basilar interstitial lung disease may be due to nonspecific interstitial pneumonitis or usual interstitial pneumonitis.  PSA was 47.6 on 05/09/2020.   Denis urgency, frequency or burning. Denies weight loss. Denies pain of any bone or joint pain.  He has chronic back pain.    PMH: Past Medical History:  Diagnosis Date  . Diabetes mellitus without complication (HCC)   . Gout   . Hypertension   . Kidney stone   . Seizures (HCC)    last seizure about 1 year ago(2017)  . Thyroid disease     Surgical History: Past Surgical History:  Procedure Laterality Date  . CYSTOSCOPY/URETEROSCOPY/HOLMIUM LASER/STENT PLACEMENT Left 05/07/2017   Procedure: CYSTOSCOPY/URETEROSCOPY/HOLMIUM LASER/STENT PLACEMENT;  Surgeon: Vanna Scotland, MD;  Location: [facility];  Service: Urology;  Laterality: Left;  .  EXTRACORPOREAL SHOCK WAVE LITHOTRIPSY Left 02/22/2016   Procedure: EXTRACORPOREAL SHOCK WAVE LITHOTRIPSY (ESWL);  Surgeon: Hildred Laser, MD;  Location: [facility] OR[facility];  Service: Urology;  Laterality: Left;  . EYE SURGERY Left    cataract surgery  . HERNIA REPAIR  70's  . TRANSURETHRAL RESECTION OF PROSTATE N/A 05/07/2017   Procedure: TRANSURETHRAL RESECTION OF THE PROSTATE (TURP);  Surgeon: Vanna Scotland, MD;  Location: [facility] OR[facility];  Service: Urology;  Laterality: N/A;    Home Medications:  Allergies as of 05/30/2020      Reactions   Enalapril Swelling   Face swelling.   Lisinopril Swelling   mouth   Amlodipine Rash, Other (See Comments)      Medication List       Accurate as of May 30, 2020 11:59 PM. If you have any questions, ask your nurse or doctor.        Accu-Chek Aviva test strip Generic drug: glucose blood 1 each by Other route 3 (three) times daily. (Fasting and 2 hours after largest meal)   acetaminophen 325 MG tablet Commonly known as: TYLENOL Take 1 tablet (325 mg total) by mouth every 6 (six) hours as needed for mild pain (or Fever >/= 101).   amLODipine 5 MG tablet Commonly known as: NORVASC Take 5 mg by mouth daily.   atorvastatin 20 MG tablet Commonly known as: LIPITOR Take 20 mg by mouth at bedtime.   donepezil 5 MG tablet Commonly known as: ARICEPT Take 5 mg by mouth at bedtime.   febuxostat 40 MG tablet Commonly known as: ULORIC Take 40 mg by mouth daily.   ferrous  sulfate 325 (65 FE) MG tablet Take 325 mg by mouth daily.   folic acid 1 MG tablet Commonly known as: FOLVITE Take 1 mg by mouth daily.   furosemide 40 MG tablet Commonly known as: LASIX Take 40 mg by mouth daily.   gabapentin 300 MG capsule Commonly known as: NEURONTIN Take 300 mg by mouth 3 (three) times daily.   insulin glargine 100 UNIT/ML injection Commonly known as: LANTUS Inject 0.3 mLs (30 Units total) into the skin daily.   insulin lispro 100 UNIT/ML  injection Commonly known as: HUMALOG Inject 5 Units into the skin 3 (three) times daily before meals. (Hold if CBG is less than 100)   lamoTRIgine 25 MG tablet Commonly known as: LAMICTAL Take 1 tablet (25 mg total) by mouth daily. What changed:   how much to take  when to take this   levETIRAcetam 500 MG tablet Commonly known as: KEPPRA Take 500 mg by mouth 2 (two) times daily.   levothyroxine 75 MCG tablet Commonly known as: SYNTHROID Take 75 mcg by mouth daily.   patiromer 8.4 g packet Commonly known as: VELTASSA Take 1 packet (8.4 g total) by mouth daily.   propranolol ER 60 MG 24 hr capsule Commonly known as: INDERAL LA Take 60 mg by mouth daily.   rivastigmine 1.5 MG capsule Commonly known as: EXELON Take 1.5 mg by mouth 2 (two) times daily.   sodium bicarbonate 650 MG tablet Take 1,300 mg by mouth 3 (three) times daily.   tamsulosin 0.4 MG Caps capsule Commonly known as: FLOMAX Take 1 capsule (0.4 mg total) by mouth daily.       Allergies:  Allergies  Allergen Reactions  . Enalapril Swelling    Face swelling.  . Lisinopril Swelling    mouth  . Amlodipine Rash and Other (See Comments)    Family History: Family History  Problem Relation Age of Onset  . Cancer Mother   . Diabetes Sister   . Kidney disease Neg Hx   . Prostate cancer Neg Hx   . Kidney cancer Neg Hx   . Bladder Cancer Neg Hx     Social History:  reports that he has never smoked. He has never used smokeless tobacco. He reports that he does not drink alcohol and does not use drugs.   Physical Exam: BP 135/69   Pulse 79   Ht 5\' 5"  (1.651 m)   Wt 188 lb (85.3 kg)   BMI 31.28 kg/m   Constitutional:  Alert and oriented, No acute distress. HEENT: [place] AT, moist mucus membranes.  Trachea midline, no masses. Cardiovascular: No clubbing, cyanosis, or edema. Respiratory: Normal respiratory effort, no increased work of breathing. Skin: No rashes, bruises or suspicious  lesions. Neurologic: Grossly intact, no focal deficits, moving all 4 extremities. Psychiatric: Normal mood and affect.    Assessment & Plan:    1. BPH with obstruction/LUTS Symptoms are controlled on Flomax.  2. Elevated PSA  PSA was 47.6 on 05/09/2020.  PSA is rising and has doubled over the year. Given his age and comorbidities, we discussed options including further definitive diagnosis and/or treatment options moving forward  Certainly there is a concern that he has a fairly significant prostate cancer with a rapid doubling time thus me be clinically significant and impact his quality of life down the road if not addressed Strongly recommended a prostate biopsy for further characterization and diagnosis given doubling trend in PSA.  Alternatively we can pursue a prostate MRI.  Patient declined prostate biopsy at this time. Patient is unsure if he will treat possible prostate cancer.  After failing the discussion today, he ultimately agreed to repeating his PSA again in 3 months.  If it continues to rise precipitously, he may consider biopsy and/or staging imaging at that time.  If he does elect treatment, will likely be with a palliative type approach rather than definitive.  Follow up in 3 months with PSA.   Return in about 3 months (around 08/30/2020) for 35mo w/PSA prior.  [facility] [address], [address] [place], [place] [address] [phone]  I, Theador Hawthorne, am acting as a scribe for Dr. Vanna Scotland.  I have reviewed the above documentation for accuracy and completeness, and I agree with the above.   Vanna Scotland, MD

## 2020-05-30 ENCOUNTER — Encounter: Payer: Self-pay | Admitting: Urology

## 2020-05-30 ENCOUNTER — Ambulatory Visit (INDEPENDENT_AMBULATORY_CARE_PROVIDER_SITE_OTHER): Payer: Medicare Other | Admitting: Urology

## 2020-05-30 ENCOUNTER — Other Ambulatory Visit: Payer: Self-pay

## 2020-05-30 VITALS — BP 135/69 | HR 79 | Ht 65.0 in | Wt 188.0 lb

## 2020-05-30 DIAGNOSIS — N401 Enlarged prostate with lower urinary tract symptoms: Secondary | ICD-10-CM | POA: Diagnosis not present

## 2020-05-30 DIAGNOSIS — N138 Other obstructive and reflux uropathy: Secondary | ICD-10-CM

## 2020-07-18 ENCOUNTER — Other Ambulatory Visit: Payer: Self-pay | Admitting: Specialist

## 2020-07-18 DIAGNOSIS — J849 Interstitial pulmonary disease, unspecified: Secondary | ICD-10-CM

## 2020-07-25 ENCOUNTER — Ambulatory Visit
Admission: RE | Admit: 2020-07-25 | Discharge: 2020-07-25 | Disposition: A | Discharge status: Home / Self Care | Payer: Medicare Other | Source: Ambulatory Visit | Attending: Family Medicine | Admitting: Family Medicine

## 2020-07-25 ENCOUNTER — Other Ambulatory Visit: Payer: Self-pay | Admitting: Specialist

## 2020-07-25 ENCOUNTER — Other Ambulatory Visit: Payer: Self-pay

## 2020-07-25 ENCOUNTER — Other Ambulatory Visit: Payer: Self-pay | Admitting: Family Medicine

## 2020-07-25 DIAGNOSIS — R071 Chest pain on breathing: Secondary | ICD-10-CM

## 2020-07-25 DIAGNOSIS — R0602 Shortness of breath: Secondary | ICD-10-CM

## 2020-07-25 DIAGNOSIS — R918 Other nonspecific abnormal finding of lung field: Secondary | ICD-10-CM

## 2020-08-25 ENCOUNTER — Other Ambulatory Visit: Payer: Self-pay

## 2020-08-25 ENCOUNTER — Emergency Department
Admission: EM | Admit: 2020-08-25 | Discharge: 2020-08-25 | Disposition: A | Discharge status: Home / Self Care | Payer: Medicare Other | Attending: Student in an Organized Health Care Education/Training Program | Admitting: Student in an Organized Health Care Education/Training Program

## 2020-08-25 ENCOUNTER — Emergency Department: Payer: Medicare Other

## 2020-08-25 ENCOUNTER — Encounter: Payer: Self-pay | Admitting: Emergency Medicine

## 2020-08-25 DIAGNOSIS — R0789 Other chest pain: Secondary | ICD-10-CM | POA: Insufficient documentation

## 2020-08-25 DIAGNOSIS — E119 Type 2 diabetes mellitus without complications: Secondary | ICD-10-CM | POA: Insufficient documentation

## 2020-08-25 DIAGNOSIS — I10 Essential (primary) hypertension: Secondary | ICD-10-CM | POA: Diagnosis not present

## 2020-08-25 DIAGNOSIS — Z79899 Other long term (current) drug therapy: Secondary | ICD-10-CM | POA: Diagnosis not present

## 2020-08-25 DIAGNOSIS — R079 Chest pain, unspecified: Secondary | ICD-10-CM | POA: Diagnosis present

## 2020-08-25 DIAGNOSIS — Z794 Long term (current) use of insulin: Secondary | ICD-10-CM | POA: Diagnosis not present

## 2020-08-25 LAB — COMPREHENSIVE METABOLIC PANEL
ALT: 18 U/L (ref 0–44)
AST: 20 U/L (ref 15–41)
Albumin: 3.9 g/dL (ref 3.5–5.0)
Alkaline Phosphatase: 92 U/L (ref 38–126)
Anion gap: 14 (ref 5–15)
BUN: 27 mg/dL — ABNORMAL HIGH (ref 8–23)
CO2: 25 mmol/L (ref 22–32)
Calcium: 9.1 mg/dL (ref 8.9–10.3)
Chloride: 97 mmol/L — ABNORMAL LOW (ref 98–111)
Creatinine, Ser: 1.92 mg/dL — ABNORMAL HIGH (ref 0.61–1.24)
GFR, Estimated: 34 mL/min — ABNORMAL LOW (ref >60)
Glucose, Bld: 160 mg/dL — ABNORMAL HIGH (ref 70–99)
Potassium: 4 mmol/L (ref 3.5–5.1)
Sodium: 136 mmol/L (ref 135–145)
Total Bilirubin: 0.7 mg/dL (ref 0.3–1.2)
Total Protein: 7.7 g/dL (ref 6.5–8.1)

## 2020-08-25 LAB — CBC WITH DIFFERENTIAL/PLATELET
Abs Immature Granulocytes: 0.02 10*3/uL (ref 0.00–0.07)
Basophils Absolute: 0 10*3/uL (ref 0.0–0.1)
Basophils Relative: 0 %
Eosinophils Absolute: 0.1 10*3/uL (ref 0.0–0.5)
Eosinophils Relative: 2 %
HCT: 33.5 % — ABNORMAL LOW (ref 39.0–52.0)
Hemoglobin: 10.3 g/dL — ABNORMAL LOW (ref 13.0–17.0)
Immature Granulocytes: 0 %
Lymphocytes Relative: 21 %
Lymphs Abs: 1.1 10*3/uL (ref 0.7–4.0)
MCH: 28.5 pg (ref 26.0–34.0)
MCHC: 30.7 g/dL (ref 30.0–36.0)
MCV: 92.8 fL (ref 80.0–100.0)
Monocytes Absolute: 0.5 10*3/uL (ref 0.1–1.0)
Monocytes Relative: 10 %
Neutro Abs: 3.4 10*3/uL (ref 1.7–7.7)
Neutrophils Relative %: 67 %
Platelets: 286 10*3/uL (ref 150–400)
RBC: 3.61 MIL/uL — ABNORMAL LOW (ref 4.22–5.81)
RDW: 13.7 % (ref 11.5–15.5)
WBC: 5.1 10*3/uL (ref 4.0–10.5)
nRBC: 0 % (ref 0.0–0.2)

## 2020-08-25 MED ORDER — LIDOCAINE 5 % EX PTCH
1.0000 | MEDICATED_PATCH | CUTANEOUS | Status: DC
  Administered 2020-08-25: 1 via TRANSDERMAL
  Filled 2020-08-25: qty 1

## 2020-08-25 MED ORDER — OXYCODONE HCL 5 MG PO TABS
5.0000 mg | ORAL_TABLET | Freq: Once | ORAL | Status: AC
  Administered 2020-08-25: 5 mg via ORAL
  Filled 2020-08-25: qty 1

## 2020-08-25 MED ORDER — LIDOCAINE 5 % EX PTCH: 1.0000 | MEDICATED_PATCH | Freq: Two times a day (BID) | CUTANEOUS | 0 refills | Status: AC

## 2020-08-25 NOTE — ED Notes (Signed)
Lab called for blood draw.

## 2020-08-25 NOTE — ED Triage Notes (Signed)
First RN Note: Pt to ED via [facility] with c/o rib pain, pt seen on Wed at PCP and dx with rib fracture on L side, per EMS pt sent home with pain medication, started taking pain medication this AM, pt states no relief with pain medication.  157/78 96% RA 72 HR 98.4  CBG 194

## 2020-08-25 NOTE — ED Notes (Signed)
Benita is Pt daughter/contact. Call for transport\  [phone]

## 2020-08-25 NOTE — ED Provider Notes (Signed)
[facility] Emergency Department Provider Note    First MD Initiated Contact with Patient 08/25/20 1058     (approximate)  I have reviewed the triage vital signs and the nursing notes.   HISTORY  Chief Complaint Chest Pain    HPI Phillip Osborne is a 86 y.o. male with the below listed past medical history presents to the ER for evaluation of persistent left rib cage pain secondary to known rib fracture.  States the pain is been constant for the past month but did get worse over the past few days.  Had CT imaging done on an outpatient basis.  No sign of effusions.  Is not having any nausea or vomiting.  No abdominal pain.  It is worse with movement he is having difficulty getting comfortable.  Was prescribed Vicodin get some minimal relief very short acting.  Is not taking anything else for pain.   Past Medical History:  Diagnosis Date  . Diabetes mellitus without complication (HCC)   . Gout   . Hypertension   . Kidney stone   . Seizures (HCC)    last seizure about 1 year ago(2017)  . Thyroid disease    Family History  Problem Relation Age of Onset  . Cancer Mother   . Diabetes Sister   . Kidney disease Neg Hx   . Prostate cancer Neg Hx   . Kidney cancer Neg Hx   . Bladder Cancer Neg Hx    Past Surgical History:  Procedure Laterality Date  . CYSTOSCOPY/URETEROSCOPY/HOLMIUM LASER/STENT PLACEMENT Left 05/07/2017   Procedure: CYSTOSCOPY/URETEROSCOPY/HOLMIUM LASER/STENT PLACEMENT;  Surgeon: Vanna Scotland, MD;  Location: [facility];  Service: Urology;  Laterality: Left;  . EXTRACORPOREAL SHOCK WAVE LITHOTRIPSY Left 02/22/2016   Procedure: EXTRACORPOREAL SHOCK WAVE LITHOTRIPSY (ESWL);  Surgeon: Hildred Laser, MD;  Location: [facility] OR[facility];  Service: Urology;  Laterality: Left;  . EYE SURGERY Left    cataract surgery  . HERNIA REPAIR  70's  . TRANSURETHRAL RESECTION OF PROSTATE N/A 05/07/2017   Procedure: TRANSURETHRAL RESECTION OF THE PROSTATE (TURP);   Surgeon: Vanna Scotland, MD;  Location: [facility] OR[facility];  Service: Urology;  Laterality: N/A;   Patient Active Problem List   Diagnosis Date Noted  . Prostate abscess 05/08/2017  . Prostatic abscess   . Weakness 05/06/2017  . Hyperkalemia 11/04/2016  . Left ureteral stone 02/20/2016  . Gross hematuria 02/20/2016      Prior to Admission medications   Medication Sig Start Date End Date Taking? Authorizing Provider  acetaminophen (TYLENOL) 325 MG tablet Take 1 tablet (325 mg total) by mouth every 6 (six) hours as needed for mild pain (or Fever >/= 101). 05/10/17   Gouru, Aruna, MD  amLODipine (NORVASC) 5 MG tablet Take 5 mg by mouth daily.    [provider]  atorvastatin (LIPITOR) 20 MG tablet Take 20 mg by mouth at bedtime.  03/25/17 11/10/19  [provider]  donepezil (ARICEPT) 5 MG tablet Take 5 mg by mouth at bedtime.    [provider]  febuxostat (ULORIC) 40 MG tablet Take 40 mg by mouth daily.    [provider]  ferrous sulfate 325 (65 FE) MG tablet Take 325 mg by mouth daily.    [provider]  folic acid (FOLVITE) 1 MG tablet Take 1 mg by mouth daily.    [provider]  furosemide (LASIX) 40 MG tablet Take 40 mg by mouth daily. 11/11/19   [provider]  gabapentin (NEURONTIN) 300  MG capsule Take 300 mg by mouth 3 (three) times daily.  10/08/16 11/10/19  [provider]  glucose blood (ACCU-CHEK AVIVA) test strip 1 each by Other route 3 (three) times daily. (Fasting and 2 hours after largest meal)    [provider]  insulin glargine (LANTUS) 100 UNIT/ML injection Inject 0.3 mLs (30 Units total) into the skin daily. 05/10/17   Gouru, Deanna Artis, MD  insulin lispro (HUMALOG) 100 UNIT/ML injection Inject 5 Units into the skin 3 (three) times daily before meals. (Hold if CBG is less than 100)    [provider]  lamoTRIgine (LAMICTAL) 25 MG tablet Take 1 tablet (25 mg total) by mouth daily. Patient taking  differently: Take 50 mg by mouth 2 (two) times daily.  01/08/17   Katha Hamming, MD  levETIRAcetam (KEPPRA) 500 MG tablet Take 500 mg by mouth 2 (two) times daily. 11/11/19   [provider]  levothyroxine (SYNTHROID, LEVOTHROID) 75 MCG tablet Take 75 mcg by mouth daily.     [provider]  lidocaine (LIDODERM) 5 % Place 1 patch onto the skin every 12 (twelve) hours. Remove & Discard patch within 12 hours or as directed by MD 08/25/20 08/25/21  Willy Eddy, MD  patiromer (VELTASSA) 8.4 g packet Take 1 packet (8.4 g total) by mouth daily. 11/05/16   Enid Baas, MD  propranolol ER (INDERAL LA) 60 MG 24 hr capsule Take 60 mg by mouth daily. 11/15/19   [provider]  rivastigmine (EXELON) 1.5 MG capsule Take 1.5 mg by mouth 2 (two) times daily.    [provider]  sodium bicarbonate 650 MG tablet Take 1,300 mg by mouth 3 (three) times daily.     [provider]  tamsulosin (FLOMAX) 0.4 MG CAPS capsule Take 1 capsule (0.4 mg total) by mouth daily. 08/17/19   Vanna Scotland, MD    Allergies Enalapril, Lisinopril, and Amlodipine    Social History Social History   Tobacco Use  . Smoking status: Never Smoker  . Smokeless tobacco: Never Used  Vaping Use  . Vaping Use: Never used  Substance Use Topics  . Alcohol use: No    Alcohol/week: 0.0 standard drinks  . Drug use: No    Review of Systems Patient denies headaches, rhinorrhea, blurry vision, numbness, shortness of breath, chest pain, edema, cough, abdominal pain, nausea, vomiting, diarrhea, dysuria, fevers, rashes or hallucinations unless otherwise stated above in HPI. ____________________________________________   PHYSICAL EXAM:  VITAL SIGNS: Vitals:   08/25/20 1100 08/25/20 1200  BP: (!) 152/82 (!) 176/76  Pulse: 68 63  Resp: (!) 21 13  Temp:    SpO2: 99% 95%    Constitutional: Alert and oriented.  Eyes: Conjunctivae are normal.  Head: Atraumatic. Nose: No  congestion/rhinnorhea. Mouth/Throat: Mucous membranes are moist.   Neck: No stridor. Painless ROM.  Cardiovascular: Normal rate, regular rhythm. Grossly normal heart sounds.  Good peripheral circulation. Respiratory: Normal respiratory effort.  No retractions. Lungs CTAB. Gastrointestinal: Soft and nontender. No distention. No abdominal bruits. No CVA tenderness. Genitourinary:  Musculoskeletal: No lower extremity tenderness nor edema.  Left knee there is tenderness to palpation no overlying ecchymosis no crepitus.  No joint effusions. Neurologic:  Normal speech and language. No gross focal neurologic deficits are appreciated. No facial droop Skin:  Skin is warm, dry and intact. No rash noted. Psychiatric: Mood and affect are normal. Speech and behavior are normal.  ____________________________________________   LABS (all labs ordered are listed, but only abnormal results are displayed)  Results for orders placed or performed during the hospital encounter of 08/25/20 (from the past 24 hour(s))  CBC with Differential     Status: Abnormal   Collection Time: 08/25/20  1:11 PM  Result Value Ref Range   WBC 5.1 4.0 - 10.5 K/uL   RBC 3.61 (L) 4.22 - 5.81 MIL/uL   Hemoglobin 10.3 (L) 13.0 - 17.0 g/dL   HCT 16.133.5 (L) 39 - 52 %   MCV 92.8 80.0 - 100.0 fL   MCH 28.5 26.0 - 34.0 pg   MCHC 30.7 30.0 - 36.0 g/dL   RDW 09.613.7 04.511.5 - 40.915.5 %   Platelets 286 150 - 400 K/uL   nRBC 0.0 0.0 - 0.2 %   Neutrophils Relative % 67 %   Neutro Abs 3.4 1.7 - 7.7 K/uL   Lymphocytes Relative 21 %   Lymphs Abs 1.1 0.7 - 4.0 K/uL   Monocytes Relative 10 %   Monocytes Absolute 0.5 0.1 - 1.0 K/uL   Eosinophils Relative 2 %   Eosinophils Absolute 0.1 0.0 - 0.5 K/uL   Basophils Relative 0 %   Basophils Absolute 0.0 0.0 - 0.1 K/uL   Immature Granulocytes 0 %   Abs Immature Granulocytes 0.02 0.00 - 0.07 K/uL  Comprehensive metabolic panel     Status: Abnormal   Collection Time: 08/25/20  1:11 PM  Result Value  Ref Range   Sodium 136 135 - 145 mmol/L   Potassium 4.0 3.5 - 5.1 mmol/L   Chloride 97 (L) 98 - 111 mmol/L   CO2 25 22 - 32 mmol/L   Glucose, Bld 160 (H) 70 - 99 mg/dL   BUN 27 (H) 8 - 23 mg/dL   Creatinine, Ser 8.111.92 (H) 0.61 - 1.24 mg/dL   Calcium 9.1 8.9 - 91.410.3 mg/dL   Total Protein 7.7 6.5 - 8.1 g/dL   Albumin 3.9 3.5 - 5.0 g/dL   AST 20 15 - 41 U/L   ALT 18 0 - 44 U/L   Alkaline Phosphatase 92 38 - 126 U/L   Total Bilirubin 0.7 0.3 - 1.2 mg/dL   GFR, Estimated 34 (L) >60 mL/min   Anion gap 14 5 - 15   ____________________________________________  EKG My review and personal interpretation at Time: 12:28   Indication: chest wall pain  Rate: 65  Rhythm: sinus Axis: normal Other: normal intervals, nonspecific st abn, no stemi ____________________________________________  RADIOLOGY  I personally reviewed all radiographic images ordered to evaluate for the above acute complaints and reviewed radiology reports and findings.  These findings were personally discussed with the patient.  Please see medical record for radiology report.  ____________________________________________   PROCEDURES  Procedure(s) performed:  Procedures    Critical Care performed: no ____________________________________________   INITIAL IMPRESSION / ASSESSMENT AND PLAN / ED COURSE  Pertinent labs & imaging results that were available during my care of the patient were reviewed by me and considered in my medical decision making (see chart for details).   DDX: Rib fracture, pneumothorax, effusion, musculoskeletal strain  Phillip Osborne is a 86 y.o. who presents to the ED with left chest wall pain associated with known rib fracture has been having this pain for the past month but becoming acutely worse.  Will check blood work as he has not had this done recently will repeat x-ray to make sure is no sign of pneumothorax or effusion.  Doubt infectious cause no sign of shingles.  No skin lesion.   Will provide topical analgesia  reassess.  Clinical Course as of Aug 25 1400  Fri Aug 25, 2020  1357 Patient reassessed.  He feels much improved.  Will prescribe Lidoderm patches that seem to have helped.  I don't identify any indication for hospitalization at this time.  Have discussed with the patient and available family all diagnostics and treatments performed thus far and all questions were answered to the best of my ability. The patient demonstrates understanding and agreement with plan.    [PR]    Clinical Course User Index [PR] Willy Eddy, MD    The patient was evaluated in Emergency Department today for the symptoms described in the history of present illness. He/she was evaluated in the context of the global COVID-19 pandemic, which necessitated consideration that the patient might be at risk for infection with the SARS-CoV-2 virus that causes COVID-19. Institutional protocols and algorithms that pertain to the evaluation of patients at risk for COVID-19 are in a state of rapid change based on information released by regulatory bodies including the CDC and federal and state organizations. These policies and algorithms were followed during the patient's care in the ED.  As part of my medical decision making, I reviewed the following data within the electronic MEDICAL RECORD NUMBER Nursing notes reviewed and incorporated, Labs reviewed, notes from prior ED visits and [place] Controlled Substance Database   ____________________________________________   FINAL CLINICAL IMPRESSION(S) / ED DIAGNOSES  Final diagnoses:  Chest wall pain      NEW MEDICATIONS STARTED DURING THIS VISIT:  New Prescriptions   LIDOCAINE (LIDODERM) 5 %    Place 1 patch onto the skin every 12 (twelve) hours. Remove & Discard patch within 12 hours or as directed by MD     Note:  This document was prepared using Dragon voice recognition software and may include unintentional dictation errors.    Willy Eddy, MD 08/25/20 1401

## 2020-08-29 ENCOUNTER — Other Ambulatory Visit: Payer: Self-pay

## 2020-09-05 ENCOUNTER — Ambulatory Visit: Payer: Self-pay | Admitting: Urology

## 2020-09-06 ENCOUNTER — Ambulatory Visit: Payer: Medicare Other

## 2020-09-12 ENCOUNTER — Other Ambulatory Visit: Payer: Self-pay

## 2020-09-12 ENCOUNTER — Other Ambulatory Visit: Payer: Medicare Other

## 2020-09-12 DIAGNOSIS — N401 Enlarged prostate with lower urinary tract symptoms: Secondary | ICD-10-CM

## 2020-09-12 DIAGNOSIS — N138 Other obstructive and reflux uropathy: Secondary | ICD-10-CM

## 2020-09-13 ENCOUNTER — Other Ambulatory Visit (HOSPITAL_COMMUNITY): Payer: Self-pay | Admitting: Specialist

## 2020-09-13 ENCOUNTER — Other Ambulatory Visit: Payer: Self-pay | Admitting: Specialist

## 2020-09-13 DIAGNOSIS — R0609 Other forms of dyspnea: Secondary | ICD-10-CM

## 2020-09-13 DIAGNOSIS — J849 Interstitial pulmonary disease, unspecified: Secondary | ICD-10-CM

## 2020-09-13 DIAGNOSIS — R0600 Dyspnea, unspecified: Secondary | ICD-10-CM

## 2020-09-13 LAB — PSA: Prostate Specific Ag, Serum: 78.6 ng/mL — ABNORMAL HIGH (ref 0.0–4.0)

## 2020-09-19 ENCOUNTER — Ambulatory Visit: Payer: Self-pay | Admitting: Urology

## 2020-10-11 ENCOUNTER — Ambulatory Visit (INDEPENDENT_AMBULATORY_CARE_PROVIDER_SITE_OTHER): Payer: Medicare Other | Admitting: Urology

## 2020-10-11 ENCOUNTER — Encounter: Payer: Self-pay | Admitting: Urology

## 2020-10-11 ENCOUNTER — Other Ambulatory Visit: Payer: Self-pay

## 2020-10-11 VITALS — BP 136/73 | HR 74

## 2020-10-11 DIAGNOSIS — R972 Elevated prostate specific antigen [PSA]: Secondary | ICD-10-CM

## 2020-10-11 DIAGNOSIS — R6889 Other general symptoms and signs: Secondary | ICD-10-CM

## 2020-10-11 DIAGNOSIS — N401 Enlarged prostate with lower urinary tract symptoms: Secondary | ICD-10-CM | POA: Diagnosis not present

## 2020-10-11 DIAGNOSIS — N138 Other obstructive and reflux uropathy: Secondary | ICD-10-CM | POA: Diagnosis not present

## 2020-10-11 NOTE — Progress Notes (Signed)
10/11/2020 10:20 AM   Phillip Osborne May 26, 1932 [id number]  Referring provider: Dion Body, MD [facility] [place],  [place] [address]  Chief Complaint  Patient presents with  . Benign Prostatic Hypertrophy    HPI: 87 year old male with rapidly rising PSA returns today for follow-up.  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  More recently, he was referred back for abnormal heterogeneity on PET scan performed on 10/2019 within the prostate as well as an elevated PSA.  PSA continues to rise.  Trend as outlined below.  He reports today that he has had some mild weight loss but nothing significant.  No joint pain.  He does have a somewhat weakened urinary stream on chronic Flomax.  No dysuria.  He does feel like he is able to empty his bladder.  Component     Latest Ref Rng & Units 05/09/2020 09/12/2020  Prostate Specific Ag, Serum     0.0 - 4.0 ng/mL 47.6 (H) 78.6 (H)     PMH: Past Medical History:  Diagnosis Date  . Diabetes mellitus without complication ([place])   . Gout   . Hypertension   . Kidney stone   . Seizures ([place])    last seizure about 1 year ago(2017)  . Thyroid disease     Surgical History: Past Surgical History:  Procedure Laterality Date  . CYSTOSCOPY/URETEROSCOPY/HOLMIUM LASER/STENT PLACEMENT Left 05/07/2017   Procedure: CYSTOSCOPY/URETEROSCOPY/HOLMIUM LASER/STENT PLACEMENT;  Surgeon: Hollice Espy, MD;  Location: [facility] ORS;  Service: Urology;  Laterality: Left;  . EXTRACORPOREAL SHOCK WAVE LITHOTRIPSY Left 02/22/2016   Procedure: EXTRACORPOREAL SHOCK WAVE LITHOTRIPSY (ESWL);  Surgeon: Nickie Retort, MD;  Location: [facility] ORS;  Service: Urology;  Laterality: Left;  . EYE SURGERY Left    cataract surgery  . HERNIA REPAIR  70's  . TRANSURETHRAL RESECTION OF PROSTATE N/A 05/07/2017   Procedure: TRANSURETHRAL RESECTION OF THE PROSTATE (TURP);   Surgeon: Hollice Espy, MD;  Location: [facility] ORS;  Service: Urology;  Laterality: N/A;    Home Medications:  Allergies as of 10/11/2020      Reactions   Enalapril Swelling   Face swelling.   Lisinopril Swelling   mouth   Amlodipine Rash, Other (See Comments)      Medication List       Accurate as of October 11, 2020 10:20 AM. If you have any questions, ask your nurse or doctor.        acetaminophen 325 MG tablet Commonly known as: TYLENOL Take 1 tablet (325 mg total) by mouth every 6 (six) hours as needed for mild pain (or Fever >/= 101).   amLODipine 5 MG tablet Commonly known as: NORVASC Take 5 mg by mouth daily.   atorvastatin 20 MG tablet Commonly known as: LIPITOR Take 20 mg by mouth at bedtime.   donepezil 5 MG tablet Commonly known as: ARICEPT Take 5 mg by mouth at bedtime.   febuxostat 40 MG tablet Commonly known as: ULORIC Take 40 mg by mouth daily.   ferrous sulfate 325 (65 FE) MG tablet Take 325 mg by mouth daily.   folic acid 1 MG tablet Commonly known as: FOLVITE Take 1 mg by mouth daily.   furosemide 40 MG tablet Commonly known as: LASIX Take 40 mg by mouth daily.   gabapentin 300 MG capsule Commonly known as: NEURONTIN Take 300 mg by mouth 3 (three) times daily.   glucose blood test strip  1 each by Other route 3 (three) times daily. (Fasting and 2 hours after largest meal)   insulin glargine 100 UNIT/ML injection Commonly known as: LANTUS Inject 0.3 mLs (30 Units total) into the skin daily.   insulin lispro 100 UNIT/ML injection Commonly known as: HUMALOG Inject 5 Units into the skin 3 (three) times daily before meals. (Hold if CBG is less than 100)   lamoTRIgine 25 MG tablet Commonly known as: LAMICTAL Take 1 tablet (25 mg total) by mouth daily. What changed:   how much to take  when to take this   levETIRAcetam 500 MG tablet Commonly known as: KEPPRA Take 500 mg by mouth 2 (two) times daily.   levothyroxine 75 MCG  tablet Commonly known as: SYNTHROID Take 75 mcg by mouth daily.   lidocaine 5 % Commonly known as: Lidoderm Place 1 patch onto the skin every 12 (twelve) hours. Remove & Discard patch within 12 hours or as directed by MD   patiromer 8.4 g packet Commonly known as: VELTASSA Take 1 packet (8.4 g total) by mouth daily.   propranolol ER 60 MG 24 hr capsule Commonly known as: INDERAL LA Take 60 mg by mouth daily.   rivastigmine 1.5 MG capsule Commonly known as: EXELON Take 1.5 mg by mouth 2 (two) times daily.   sodium bicarbonate 650 MG tablet Take 1,300 mg by mouth 3 (three) times daily.   tamsulosin 0.4 MG Caps capsule Commonly known as: FLOMAX Take 1 capsule (0.4 mg total) by mouth daily.       Allergies:  Allergies  Allergen Reactions  . Enalapril Swelling    Face swelling.  . Lisinopril Swelling    mouth  . Amlodipine Rash and Other (See Comments)    Family History: Family History  Problem Relation Age of Onset  . Cancer Mother   . Diabetes Sister   . Kidney disease Neg Hx   . Prostate cancer Neg Hx   . Kidney cancer Neg Hx   . Bladder Cancer Neg Hx     Social History:  reports that he has never smoked. He has never used smokeless tobacco. He reports that he does not drink alcohol and does not use drugs.   Physical Exam: BP 136/73   Pulse 74   Constitutional:  Alert and oriented, No acute distress.  Accompanied by granddaughter today.  Unsteady on feet, in wheelchair with cane. HEENT: [place] AT, moist mucus membranes.  Trachea midline, no masses. Cardiovascular: No clubbing, cyanosis, or edema. Respiratory: Normal respiratory effort, no increased work of breathing. Rectal: Assisted by CMA, Debara Pickett.  Decreased sphincter tone.  Grossly abnormal nodular "bag of marbles" prostate Skin: No rashes, bruises or suspicious lesions. Neurologic: Grossly intact, no focal deficits, moving all 4 extremities. Psychiatric: Normal mood and affect.  Laboratory  Data: Lab Results  Component Value Date   WBC 5.1 08/25/2020   HGB 10.3 (L) 08/25/2020   HCT 33.5 (L) 08/25/2020   MCV 92.8 08/25/2020   PLT 286 08/25/2020    Lab Results  Component Value Date   CREATININE 1.92 (H) 08/25/2020    Lab Results  Component Value Date   HGBA1C 7.6 (H) 05/05/2017    Assessment & Plan:    1. Rising PSA level/suspected prostate cancer PSA is risen precipitously over the past 4 months.  Rectal exam is grossly abnormal.  At this point, strongly suspect clinically significant prostate cancer.  He is relatively asymptomatic although has had slight progression in his obstructive urinary symptoms.  Differential-itis is does include recurrent prostatic abscess or prostatitis although less likely.  We will plan for CT abdomen pelvis as well as bone scan for staging.  We will hold off on prostate biopsy at this time as he is reluctant to want to pursue this and given clinical diagnosis, there is minimal need at this point.  He is agreeable to plan and will return with his granddaughter to review the results.  May consider treatment if metastatic.  2. Abnormal digital rectal exam As above, highly suspicious  3. BPH with obstruction/lower urinary tract symptoms Continue Flomax - CT Abdomen Pelvis W Contrast; Future - NM Bone Scan Whole Body; Future   Vanna Scotland, MD  [facility] [address], [address] [place], [place] [address] [phone]

## 2020-10-14 ENCOUNTER — Emergency Department: Payer: Medicare Other

## 2020-10-14 ENCOUNTER — Other Ambulatory Visit: Payer: Self-pay

## 2020-10-14 ENCOUNTER — Emergency Department
Admission: EM | Admit: 2020-10-14 | Discharge: 2020-10-14 | Disposition: A | Discharge status: Home / Self Care | Payer: Medicare Other | Attending: Emergency Medicine | Admitting: Emergency Medicine

## 2020-10-14 ENCOUNTER — Encounter: Payer: Self-pay | Admitting: Emergency Medicine

## 2020-10-14 DIAGNOSIS — I10 Essential (primary) hypertension: Secondary | ICD-10-CM | POA: Insufficient documentation

## 2020-10-14 DIAGNOSIS — E119 Type 2 diabetes mellitus without complications: Secondary | ICD-10-CM | POA: Insufficient documentation

## 2020-10-14 DIAGNOSIS — Z79899 Other long term (current) drug therapy: Secondary | ICD-10-CM | POA: Insufficient documentation

## 2020-10-14 DIAGNOSIS — R0789 Other chest pain: Secondary | ICD-10-CM | POA: Insufficient documentation

## 2020-10-14 DIAGNOSIS — R079 Chest pain, unspecified: Secondary | ICD-10-CM | POA: Diagnosis present

## 2020-10-14 DIAGNOSIS — Z794 Long term (current) use of insulin: Secondary | ICD-10-CM | POA: Insufficient documentation

## 2020-10-14 LAB — CBC
HCT: 36.3 % — ABNORMAL LOW (ref 39.0–52.0)
Hemoglobin: 11.3 g/dL — ABNORMAL LOW (ref 13.0–17.0)
MCH: 28.2 pg (ref 26.0–34.0)
MCHC: 31.1 g/dL (ref 30.0–36.0)
MCV: 90.5 fL (ref 80.0–100.0)
Platelets: 358 10*3/uL (ref 150–400)
RBC: 4.01 MIL/uL — ABNORMAL LOW (ref 4.22–5.81)
RDW: 14.5 % (ref 11.5–15.5)
WBC: 7.5 10*3/uL (ref 4.0–10.5)
nRBC: 0 % (ref 0.0–0.2)

## 2020-10-14 LAB — TROPONIN I (HIGH SENSITIVITY)
Troponin I (High Sensitivity): 7 ng/L (ref <18)
Troponin I (High Sensitivity): 7 ng/L (ref <18)

## 2020-10-14 LAB — BASIC METABOLIC PANEL
Anion gap: 17 — ABNORMAL HIGH (ref 5–15)
BUN: 26 mg/dL — ABNORMAL HIGH (ref 8–23)
CO2: 22 mmol/L (ref 22–32)
Calcium: 9.9 mg/dL (ref 8.9–10.3)
Chloride: 100 mmol/L (ref 98–111)
Creatinine, Ser: 1.99 mg/dL — ABNORMAL HIGH (ref 0.61–1.24)
GFR, Estimated: 32 mL/min — ABNORMAL LOW (ref >60)
Glucose, Bld: 149 mg/dL — ABNORMAL HIGH (ref 70–99)
Potassium: 3.9 mmol/L (ref 3.5–5.1)
Sodium: 139 mmol/L (ref 135–145)

## 2020-10-14 MED ORDER — PREDNISONE 10 MG PO TABS: 10.0000 mg | ORAL_TABLET | Freq: Every day | ORAL | 0 refills | Status: AC

## 2020-10-14 NOTE — ED Notes (Signed)
EDP at bedside  

## 2020-10-14 NOTE — ED Notes (Signed)
Attempted straight stick without success. Blood draw attempted twice in triage. Will ask for [place] IV.

## 2020-10-14 NOTE — ED Notes (Signed)
Lab called for blood draw.

## 2020-10-14 NOTE — ED Notes (Signed)
Lab called for second lab draw

## 2020-10-14 NOTE — ED Provider Notes (Signed)
[facility] Emergency Department Provider Note  ____________________________________________  Time seen: Approximately 1:10 PM  I have reviewed the triage vital signs and the nursing notes.   HISTORY  Chief Complaint Chest Pain    HPI Phillip Osborne is a 87 y.o. male who presents the emergency department complaining of left-sided rib/chest pain.  Patient states that he broke several ribs a month ago, he has had continued pain to the area since.  As this area not been improving and seemed to worsen slightly last night patient presents emergency department for evaluation.  He denies any substernal pain.  No radiation of the pain.  No difficulty breathing or shortness of breath.  Patient does have a history of a mass in the right upper chest that is concerning for adenocarcinoma that is known.  This is being followed by pulmonology and primary care.  Patient does have a history of diabetes, gout, hypertension.  No complaints with chronic medical issues.  No recurrent trauma to the left chest.  Patient denies any abdominal complaints.  Patient denies any URI complaints.  No medications for his complaint prior to arrival.         Past Medical History:  Diagnosis Date  . Diabetes mellitus without complication ([facility])   . Gout   . Hypertension   . Kidney stone   . Seizures ([facility])    last seizure about 1 year ago(2017)  . Thyroid disease     Patient Active Problem List   Diagnosis Date Noted  . Prostate abscess 05/08/2017  . Prostatic abscess   . Weakness 05/06/2017  . Hyperkalemia 11/04/2016  . Left ureteral stone 02/20/2016  . Gross hematuria 02/20/2016    Past Surgical History:  Procedure Laterality Date  . CYSTOSCOPY/URETEROSCOPY/HOLMIUM LASER/STENT PLACEMENT Left 05/07/2017   Procedure: CYSTOSCOPY/URETEROSCOPY/HOLMIUM LASER/STENT PLACEMENT;  Surgeon: Vanna Scotland, MD;  Location: [facility] ORS;  Service: Urology;  Laterality: Left;  . EXTRACORPOREAL SHOCK  WAVE LITHOTRIPSY Left 02/22/2016   Procedure: EXTRACORPOREAL SHOCK WAVE LITHOTRIPSY (ESWL);  Surgeon: Hildred Laser, MD;  Location: [facility] ORS;  Service: Urology;  Laterality: Left;  . EYE SURGERY Left    cataract surgery  . HERNIA REPAIR  70's  . TRANSURETHRAL RESECTION OF PROSTATE N/A 05/07/2017   Procedure: TRANSURETHRAL RESECTION OF THE PROSTATE (TURP);  Surgeon: Vanna Scotland, MD;  Location: [facility] ORS;  Service: Urology;  Laterality: N/A;    Prior to Admission medications   Medication Sig Start Date End Date Taking? Authorizing Provider  predniSONE (DELTASONE) 10 MG tablet Take 1 tablet (10 mg total) by mouth daily. 10/14/20  Yes Jayelle Page, Delorise Royals, PA-C  acetaminophen (TYLENOL) 325 MG tablet Take 1 tablet (325 mg total) by mouth every 6 (six) hours as needed for mild pain (or Fever >/= 101). 05/10/17   Gouru, Aruna, MD  amLODipine (NORVASC) 5 MG tablet Take 5 mg by mouth daily.    [provider]  atorvastatin (LIPITOR) 20 MG tablet Take 20 mg by mouth at bedtime.  03/25/17 10/11/20  [provider]  donepezil (ARICEPT) 5 MG tablet Take 5 mg by mouth at bedtime.    [provider]  febuxostat (ULORIC) 40 MG tablet Take 40 mg by mouth daily.    [provider]  ferrous sulfate 325 (65 FE) MG tablet Take 325 mg by mouth daily.    [provider]  folic acid (FOLVITE) 1 MG tablet Take 1 mg by mouth daily.    [provider]  furosemide (LASIX) 40  MG tablet Take 40 mg by mouth daily. 11/11/19   [provider]  gabapentin (NEURONTIN) 300 MG capsule Take 300 mg by mouth 3 (three) times daily.  10/08/16 10/11/20  [provider]  glucose blood test strip 1 each by Other route 3 (three) times daily. (Fasting and 2 hours after largest meal)    [provider]  insulin glargine (LANTUS) 100 UNIT/ML injection Inject 0.3 mLs (30 Units total) into the skin daily. 05/10/17   Gouru, Deanna Artis, MD  insulin lispro (HUMALOG) 100 UNIT/ML  injection Inject 5 Units into the skin 3 (three) times daily before meals. (Hold if CBG is less than 100)    [provider]  lamoTRIgine (LAMICTAL) 25 MG tablet Take 1 tablet (25 mg total) by mouth daily. Patient taking differently: Take 50 mg by mouth 2 (two) times daily. 01/08/17   Katha Hamming, MD  levETIRAcetam (KEPPRA) 500 MG tablet Take 500 mg by mouth 2 (two) times daily. 11/11/19   [provider]  levothyroxine (SYNTHROID, LEVOTHROID) 75 MCG tablet Take 75 mcg by mouth daily.     [provider]  lidocaine (LIDODERM) 5 % Place 1 patch onto the skin every 12 (twelve) hours. Remove & Discard patch within 12 hours or as directed by MD 08/25/20 08/25/21  Willy Eddy, MD  patiromer (VELTASSA) 8.4 g packet Take 1 packet (8.4 g total) by mouth daily. 11/05/16   Enid Baas, MD  propranolol ER (INDERAL LA) 60 MG 24 hr capsule Take 60 mg by mouth daily. 11/15/19   [provider]  rivastigmine (EXELON) 1.5 MG capsule Take 1.5 mg by mouth 2 (two) times daily.    [provider]  sodium bicarbonate 650 MG tablet Take 1,300 mg by mouth 3 (three) times daily.     [provider]  tamsulosin (FLOMAX) 0.4 MG CAPS capsule Take 1 capsule (0.4 mg total) by mouth daily. 08/17/19   Vanna Scotland, MD    Allergies Enalapril, Lisinopril, and Amlodipine  Family History  Problem Relation Age of Onset  . Cancer Mother   . Diabetes Sister   . Kidney disease Neg Hx   . Prostate cancer Neg Hx   . Kidney cancer Neg Hx   . Bladder Cancer Neg Hx     Social History Social History   Tobacco Use  . Smoking status: Never Smoker  . Smokeless tobacco: Never Used  Vaping Use  . Vaping Use: Never used  Substance Use Topics  . Alcohol use: No    Alcohol/week: 0.0 standard drinks  . Drug use: No     Review of Systems  Constitutional: No fever/chills Eyes: No visual changes. No discharge ENT: No upper respiratory  complaints. Cardiovascular: Left-sided chest pain/rib pain Respiratory: no cough. No SOB. Gastrointestinal: No abdominal pain.  No nausea, no vomiting.  No diarrhea.  No constipation. Musculoskeletal: Negative for musculoskeletal pain. Skin: Negative for rash, abrasions, lacerations, ecchymosis. Neurological: Negative for headaches, focal weakness or numbness.  10 System ROS otherwise negative.  ____________________________________________   PHYSICAL EXAM:  VITAL SIGNS: ED Triage Vitals  Enc Vitals Group     BP 10/14/20 1010 114/66     Pulse Rate 10/14/20 1010 69     Resp 10/14/20 1010 18     Temp 10/14/20 1010 97.8 F (36.6 C)     Temp Source 10/14/20 1010 Oral     SpO2 10/14/20 1010 99 %     Weight 10/14/20 1008 180 lb (81.6 kg)  Height 10/14/20 1008 5\' 5"  (1.651 m)     Head Circumference --      Peak Flow --      Pain Score 10/14/20 1008 6     Pain Loc --      Pain Edu? --      Excl. in GC? --      Constitutional: Alert and oriented. Well appearing and in no acute distress. Eyes: Conjunctivae are normal. PERRL. EOMI. Head: Atraumatic. ENT:      Ears:       Nose: No congestion/rhinnorhea.      Mouth/Throat: Mucous membranes are moist.  Neck: No stridor.   Cardiovascular: Normal rate, regular rhythm. Normal S1 and S2.  No appreciable murmurs, rubs, gallops.  Good peripheral circulation. Respiratory: Normal respiratory effort without tachypnea or retractions. Lungs CTAB. Good air entry to the bases with no decreased or absent breath sounds. Musculoskeletal: Full range of motion to all extremities. No gross deformities appreciated.  Visualization of the left chest wall reveals no signs of trauma with ecchymosis, abrasions or lacerations.  No paradoxical chest wall movement.  Tender approximately over left fifth and sixth rib.  No palpable abnormality or crepitus.  No subcutaneous emphysema.  Good underlying breath sounds bilaterally. Neurologic:  Normal speech and  language. No gross focal neurologic deficits are appreciated.  Skin:  Skin is warm, dry and intact. No rash noted. Psychiatric: Mood and affect are normal. Speech and behavior are normal. Patient exhibits appropriate insight and judgement.   ____________________________________________   LABS (all labs ordered are listed, but only abnormal results are displayed)  Labs Reviewed  BASIC METABOLIC PANEL  CBC  TROPONIN I (HIGH SENSITIVITY)  TROPONIN I (HIGH SENSITIVITY)  TROPONIN I (HIGH SENSITIVITY)   ____________________________________________  EKG  ED ECG REPORT I, Delorise RoyalsJonathan D Sigourney Portillo,  personally viewed and interpreted this ECG.   Date: 10/14/2020  EKG Time: 1001 hrs.  Rate: 74 bpm  Rhythm: unchanged from previous tracings, normal sinus rhythm, nonspecific ST and T waves changes in the anteroseptal leads  Axis: Left axis deviation  Intervals:none  ST&T Change: No elevation or depression noted.  Nonspecific ST changes in the anteroseptal leads.  No STEMI.  Normal sinus rhythm.  No significant changes from previous EKG on 05/05/2017  ____________________________________________  RADIOLOGY I personally viewed and evaluated these images as part of my medical decision making, as well as reviewing the written report by the radiologist.  ED Provider Interpretation: I concur with radiologist finding of no acute cardiopulmonary abnormality such as rib fracture or consolidation concerning for pneumonia.  There is evidence consistent with groundglass opacity/mass in the right upper lobe which has been previously imaged.  DG Chest 2 View  Result Date: 10/14/2020 CLINICAL DATA:  Shortness of breath EXAM: CHEST - 2 VIEW COMPARISON:  Chest x-rays dated 08/25/2020 and 05/06/2017 FINDINGS: Heart size and mediastinal contours are within normal limits. Ground-glass opacity within the RIGHT upper lobe, better demonstrated on earlier chest CT of 07/25/2020. Lungs otherwise clear. No pleural  effusion or pneumothorax is seen. No acute appearing osseous abnormality. IMPRESSION: 1. No active cardiopulmonary disease. No new lung findings. No evidence of pneumonia or pulmonary edema. 2. Previously described rib fractures are not visible by plain film. 3. Ground-glass opacity within the RIGHT upper lobe, better demonstrated on earlier chest CT of 07/25/2020 and described as a ground-glass mass worrisome for adenocarcinoma on that chest CT report. Electronically Signed   By: Bary RichardStan  Maynard M.D.   On: 10/14/2020 11:04  ____________________________________________    PROCEDURES  Procedure(s) performed:    Procedures    Medications - No data to display   ____________________________________________   INITIAL IMPRESSION / ASSESSMENT AND PLAN / ED COURSE  Pertinent labs & imaging results that were available during my care of the patient were reviewed by me and considered in my medical decision making (see chart for details).  Review of the [place] CSRS was performed in accordance of the [facility] prior to dispensing any controlled drugs.           Patient's diagnosis is consistent with chest wall pain.  Patient presented to the emergency department with a month of left-sided chest/chest wall pain.  Patient had fractured ribs that appeared to have healed on x-ray.  Still having ongoing pain to the area.  No recent trauma.  No other concerning URI symptoms or shortness of breath.  Patient denied this being a substernal pain.  Initial EKG, chest x-ray were reassuring.  Patient is an exceptionally hard stick and patient was tried both straight stick as well as with ultrasound.  We were able to obtain 1 troponin which was in the normal limits.  Patient is very tender specifically along the ribs on palpation, palpation does reproduce his pain and increases pain.  At this point I feel that this is likely chest wall pain from healing rib fractures.  I discussed further labs with the patient and his  daughter and they feel this is likely due to his previous rib fractures.  I concur do not feel at this time no further work-up is deemed necessary.  Patient will have prednisone for symptom relief.  I have cautioned the patient as he is a diabetic regarding prednisone and his diabetes.  He will continue to monitor his blood sugars.  Return precautions discussed with the patient and his daughter..Follow-up primary care as needed.  Patient is given ED precautions to return to the ED for any worsening or new symptoms.     ____________________________________________  FINAL CLINICAL IMPRESSION(S) / ED DIAGNOSES  Final diagnoses:  Chest wall pain      NEW MEDICATIONS STARTED DURING THIS VISIT:  ED Discharge Orders         Ordered    predniSONE (DELTASONE) 10 MG tablet  Daily       Note to Pharmacy: Take 6 pills x 2 days, 5 pills x 2 days, 4 pills x 2 days, 3 pills x 2 days, 2 pills x 2 days, and 1 pill x 2 days   10/14/20 1702              This chart was dictated using voice recognition software/Dragon. Despite best efforts to proofread, errors can occur which can change the meaning. Any change was purely unintentional.    Racheal Patches, PA-C 10/14/20 1703    Shaune Pollack, MD 10/14/20 2013

## 2020-10-14 NOTE — ED Triage Notes (Signed)
FIRST NURSE: Via EMS pt with chest pain since last night.  Pain worse on palpation, pt with hx of rib fractures in same area recently.  EMS 12 lead unremarkable.

## 2020-10-14 NOTE — ED Triage Notes (Signed)
Pt reports some pain to his left chest that started last pm. Pt reports fx'd his ribs a month ago and is not sure if it is related. Pt c/o SOB as well.

## 2020-11-02 ENCOUNTER — Telehealth: Payer: Self-pay | Admitting: *Deleted

## 2020-11-02 NOTE — Telephone Encounter (Signed)
Called patient in regards to upcoming follow up appointment, per daughter under Hospice care-cancel all follow ups.

## 2020-11-08 ENCOUNTER — Ambulatory Visit: Payer: Self-pay | Admitting: Urology

## 2020-12-05 DEATH — deceased

## 2021-01-09 ENCOUNTER — Ambulatory Visit: Payer: Medicare Other
